# Patient Record
Sex: Female | Born: 1935
Health system: Southern US, Community
[De-identification: ages and names within clinical notes are randomized; demographics above are authoritative.]

## PROBLEM LIST (undated history)

## (undated) DIAGNOSIS — D649 Anemia, unspecified: Secondary | ICD-10-CM

## (undated) DIAGNOSIS — K219 Gastro-esophageal reflux disease without esophagitis: Secondary | ICD-10-CM

## (undated) DIAGNOSIS — E785 Hyperlipidemia, unspecified: Secondary | ICD-10-CM

## (undated) DIAGNOSIS — I10 Essential (primary) hypertension: Secondary | ICD-10-CM

## (undated) HISTORY — DX: Essential (primary) hypertension: I10

## (undated) HISTORY — DX: Gastro-esophageal reflux disease without esophagitis: K21.9

## (undated) HISTORY — DX: Hyperlipidemia, unspecified: E78.5

## (undated) HISTORY — PX: CATARACT EXTRACTION: SUR2

## (undated) HISTORY — DX: Anemia, unspecified: D64.9

---

## 2002-07-02 HISTORY — PX: APPENDECTOMY: SHX54

## 2002-07-02 HISTORY — PX: COLONOSCOPY: SHX174

## 2004-12-26 ENCOUNTER — Ambulatory Visit: Payer: Self-pay | Admitting: Ophthalmology

## 2005-02-23 ENCOUNTER — Ambulatory Visit: Payer: Self-pay | Admitting: Internal Medicine

## 2005-03-01 ENCOUNTER — Ambulatory Visit: Payer: Self-pay | Admitting: Internal Medicine

## 2005-03-16 ENCOUNTER — Ambulatory Visit: Payer: Self-pay | Admitting: Ophthalmology

## 2005-03-20 ENCOUNTER — Ambulatory Visit: Payer: Self-pay | Admitting: Internal Medicine

## 2005-03-20 ENCOUNTER — Other Ambulatory Visit: Payer: Self-pay

## 2005-12-17 ENCOUNTER — Ambulatory Visit: Payer: Self-pay | Admitting: Internal Medicine

## 2006-06-18 ENCOUNTER — Ambulatory Visit: Payer: Self-pay

## 2007-09-10 ENCOUNTER — Ambulatory Visit: Payer: Self-pay | Admitting: Internal Medicine

## 2007-12-17 ENCOUNTER — Ambulatory Visit: Payer: Self-pay | Admitting: Internal Medicine

## 2008-10-21 ENCOUNTER — Ambulatory Visit: Payer: Self-pay | Admitting: Internal Medicine

## 2009-03-04 ENCOUNTER — Ambulatory Visit: Payer: Self-pay | Admitting: Vascular Surgery

## 2009-03-17 ENCOUNTER — Inpatient Hospital Stay: Payer: Self-pay | Admitting: Vascular Surgery

## 2009-04-02 ENCOUNTER — Emergency Department: Payer: Self-pay | Admitting: Emergency Medicine

## 2009-08-29 ENCOUNTER — Inpatient Hospital Stay: Payer: Self-pay | Admitting: Internal Medicine

## 2009-08-30 ENCOUNTER — Ambulatory Visit: Payer: Self-pay | Admitting: Internal Medicine

## 2009-09-12 ENCOUNTER — Ambulatory Visit: Payer: Self-pay | Admitting: Internal Medicine

## 2009-09-30 ENCOUNTER — Ambulatory Visit: Payer: Self-pay | Admitting: Internal Medicine

## 2009-10-27 ENCOUNTER — Ambulatory Visit: Payer: Self-pay | Admitting: Internal Medicine

## 2009-10-30 ENCOUNTER — Ambulatory Visit: Payer: Self-pay | Admitting: Internal Medicine

## 2009-11-01 ENCOUNTER — Ambulatory Visit: Payer: Self-pay | Admitting: Internal Medicine

## 2009-11-30 ENCOUNTER — Ambulatory Visit: Payer: Self-pay | Admitting: Internal Medicine

## 2009-12-30 ENCOUNTER — Ambulatory Visit: Payer: Self-pay | Admitting: Internal Medicine

## 2010-01-12 ENCOUNTER — Ambulatory Visit: Payer: Self-pay | Admitting: Internal Medicine

## 2010-01-30 ENCOUNTER — Ambulatory Visit: Payer: Self-pay | Admitting: Internal Medicine

## 2010-03-21 ENCOUNTER — Ambulatory Visit: Payer: Self-pay | Admitting: Internal Medicine

## 2010-04-30 IMAGING — US US PELVIS LIMITED
1 series · 17 of 22 positions shown · non-contrast
Comparison: none

REASON FOR EXAM: right groin pain, recent vascular procedure
COMMENTS:

[Series 1: us pelvis limited · 17 of 22 slices shown]
[im 1/22]
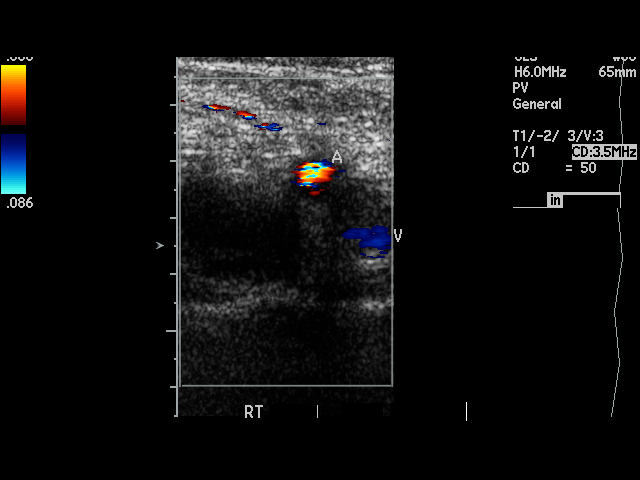
[im 2/22]
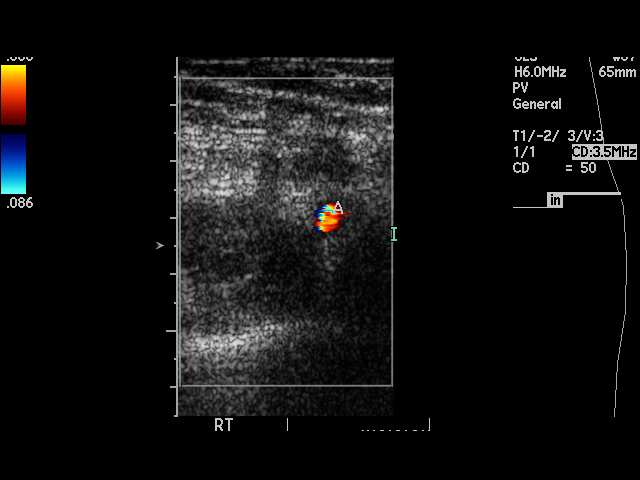
[im 4/22]
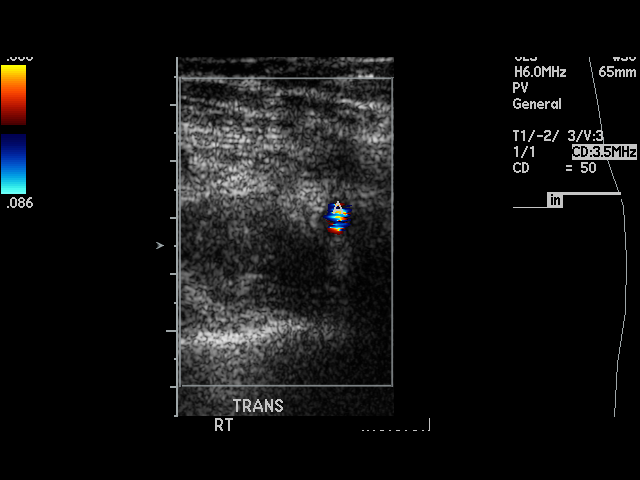
[im 5/22]
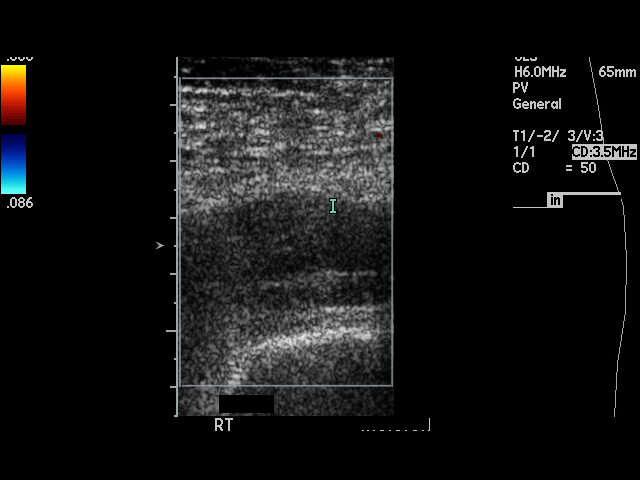
[im 6/22]
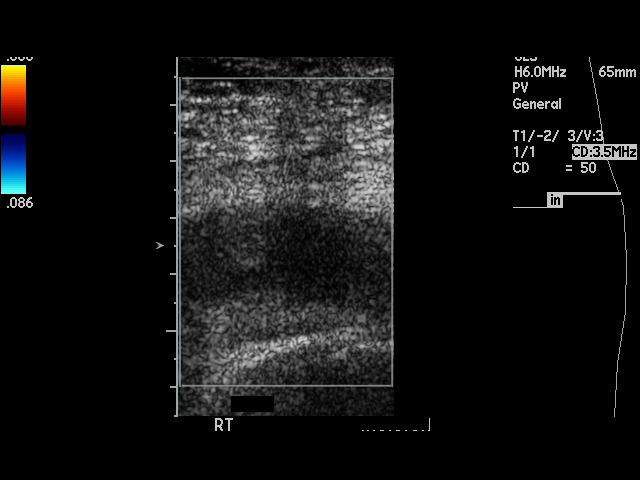
[im 8/22]
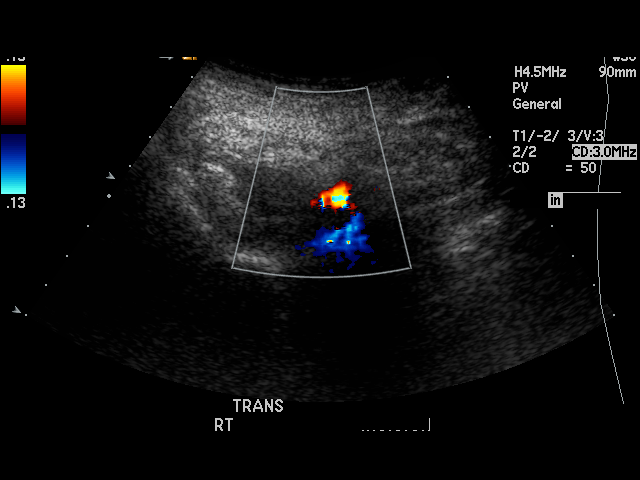
[im 9/22]
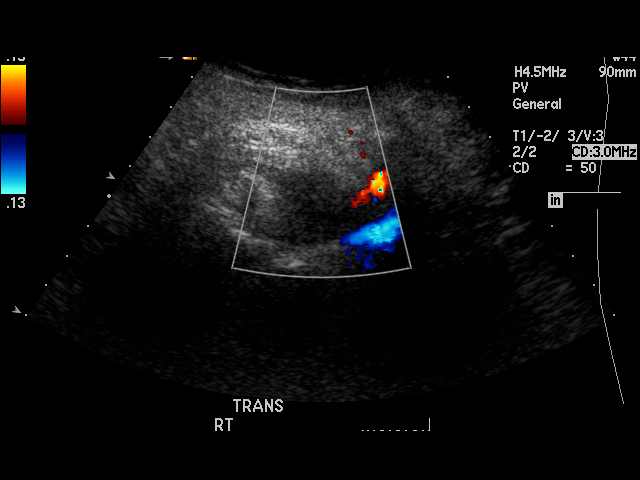
[im 10/22]
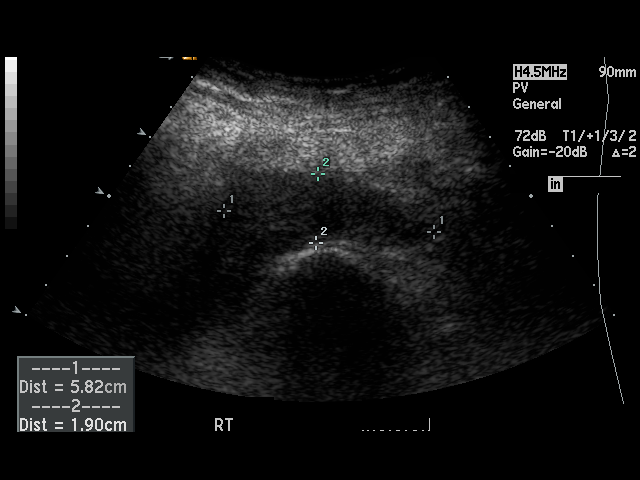
[im 12/22]
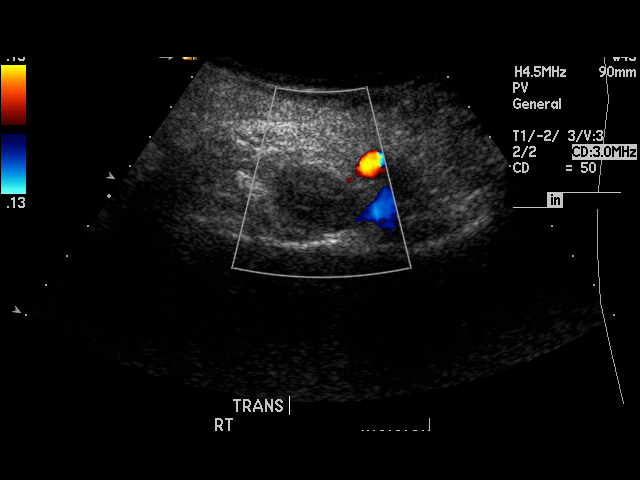
[im 13/22]
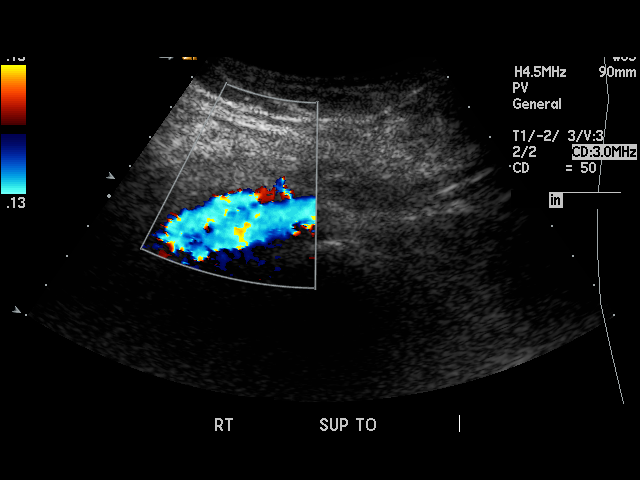
[im 14/22]
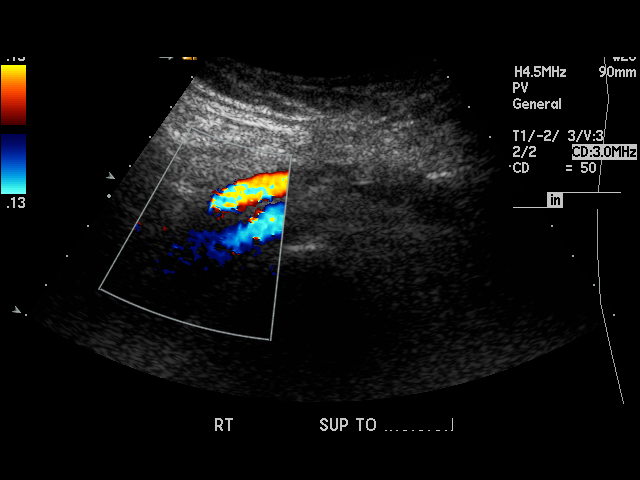
[im 15/22]
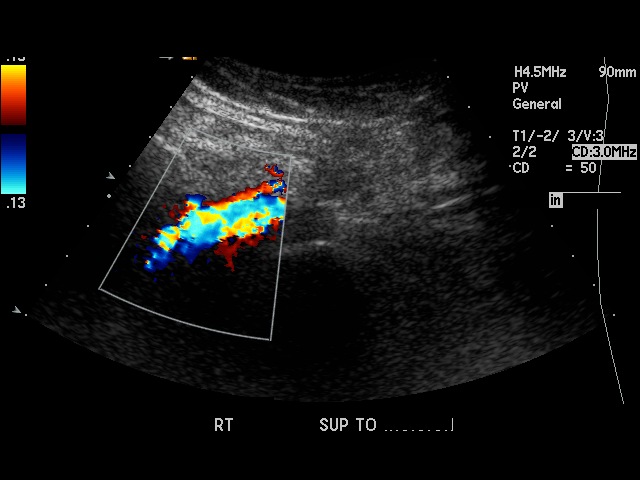
[im 17/22]
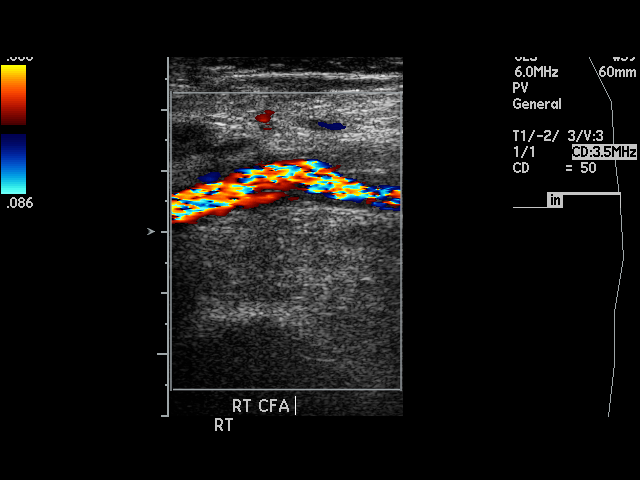
[im 18/22]
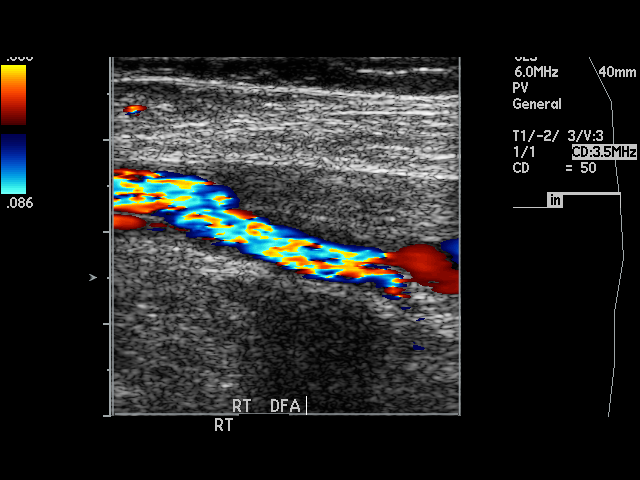
[im 19/22]
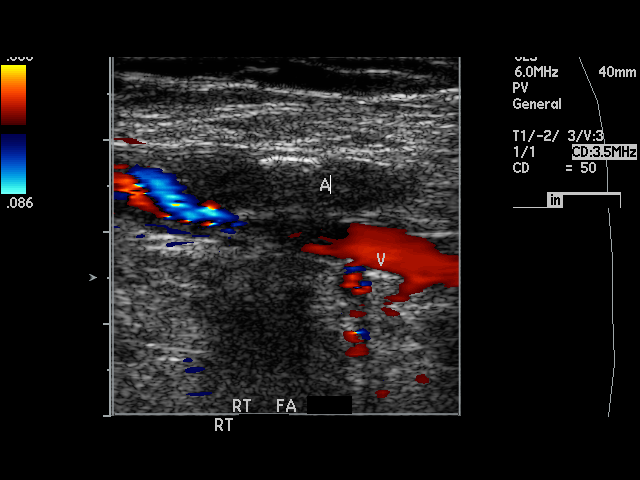
[im 21/22]
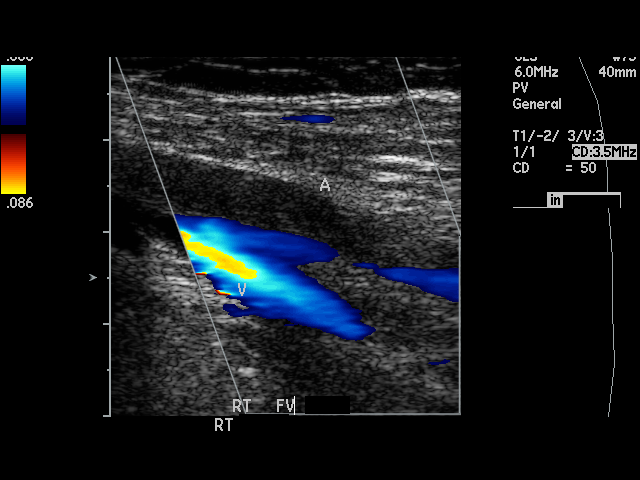
[im 22/22]
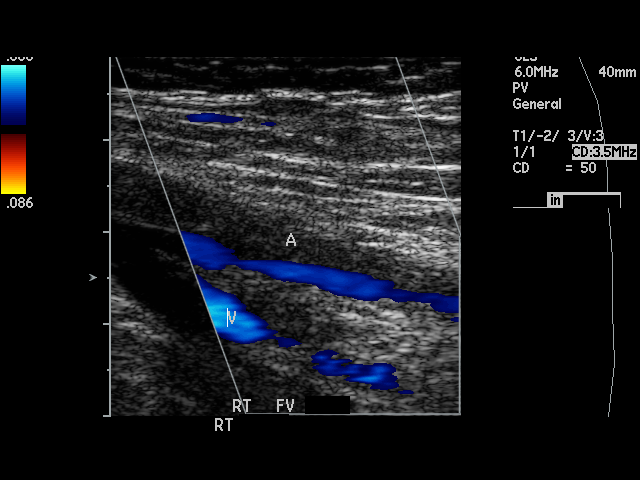

[17 of 22 positions shown; findings below may reference images not displayed]

PROCEDURE:     US  - US PSEUDOANEURYSM GROIN RIGHT  - April 02, 2009 [DATE]

RESULT:     Right groin color flow duplex Doppler reveals roughly 5.8 cm
hypoechoic region. This most likely represents a small hematoma. Clinical
correlation is suggested to exclude infection. There is no evidence of flow
within the density to suggest pseudoaneurysm. Proximal superficial femoral
artery is occluded. Common femoral artery appears patent.
IMPRESSION: Findings consistent with hematoma right groin as described
above.

## 2010-11-08 ENCOUNTER — Emergency Department: Payer: Self-pay | Admitting: *Deleted

## 2011-03-28 ENCOUNTER — Ambulatory Visit: Payer: Self-pay | Admitting: Internal Medicine

## 2012-07-02 HISTORY — PX: BYPASS GRAFT: SHX909

## 2012-11-12 ENCOUNTER — Ambulatory Visit: Payer: Self-pay | Admitting: Internal Medicine

## 2013-01-11 ENCOUNTER — Ambulatory Visit: Payer: Self-pay | Admitting: Family Medicine

## 2013-05-19 DIAGNOSIS — R9439 Abnormal result of other cardiovascular function study: Secondary | ICD-10-CM | POA: Insufficient documentation

## 2013-05-19 DIAGNOSIS — I739 Peripheral vascular disease, unspecified: Secondary | ICD-10-CM | POA: Insufficient documentation

## 2013-05-19 DIAGNOSIS — I10 Essential (primary) hypertension: Secondary | ICD-10-CM | POA: Insufficient documentation

## 2013-05-19 DIAGNOSIS — Z9049 Acquired absence of other specified parts of digestive tract: Secondary | ICD-10-CM | POA: Insufficient documentation

## 2013-05-19 DIAGNOSIS — D649 Anemia, unspecified: Secondary | ICD-10-CM | POA: Insufficient documentation

## 2013-05-19 DIAGNOSIS — Z87891 Personal history of nicotine dependence: Secondary | ICD-10-CM | POA: Insufficient documentation

## 2013-06-03 DIAGNOSIS — Z951 Presence of aortocoronary bypass graft: Secondary | ICD-10-CM | POA: Insufficient documentation

## 2013-06-07 DIAGNOSIS — I9789 Other postprocedural complications and disorders of the circulatory system, not elsewhere classified: Secondary | ICD-10-CM

## 2013-06-07 DIAGNOSIS — I4891 Unspecified atrial fibrillation: Secondary | ICD-10-CM | POA: Insufficient documentation

## 2013-12-02 DIAGNOSIS — R110 Nausea: Secondary | ICD-10-CM | POA: Insufficient documentation

## 2014-01-19 ENCOUNTER — Ambulatory Visit: Payer: Self-pay | Admitting: Internal Medicine

## 2014-01-25 ENCOUNTER — Ambulatory Visit: Payer: Self-pay | Admitting: Gastroenterology

## 2014-08-03 DIAGNOSIS — I10 Essential (primary) hypertension: Secondary | ICD-10-CM | POA: Diagnosis not present

## 2014-08-03 DIAGNOSIS — I739 Peripheral vascular disease, unspecified: Secondary | ICD-10-CM | POA: Diagnosis not present

## 2014-08-03 DIAGNOSIS — D860 Sarcoidosis of lung: Secondary | ICD-10-CM | POA: Diagnosis not present

## 2014-08-03 DIAGNOSIS — E782 Mixed hyperlipidemia: Secondary | ICD-10-CM | POA: Diagnosis not present

## 2014-09-21 DIAGNOSIS — I739 Peripheral vascular disease, unspecified: Secondary | ICD-10-CM | POA: Diagnosis not present

## 2014-09-21 DIAGNOSIS — E782 Mixed hyperlipidemia: Secondary | ICD-10-CM | POA: Diagnosis not present

## 2014-09-21 DIAGNOSIS — I10 Essential (primary) hypertension: Secondary | ICD-10-CM | POA: Diagnosis not present

## 2014-09-21 DIAGNOSIS — R300 Dysuria: Secondary | ICD-10-CM | POA: Diagnosis not present

## 2014-09-21 DIAGNOSIS — Z0001 Encounter for general adult medical examination with abnormal findings: Secondary | ICD-10-CM | POA: Diagnosis not present

## 2014-09-21 DIAGNOSIS — I2584 Coronary atherosclerosis due to calcified coronary lesion: Secondary | ICD-10-CM | POA: Diagnosis not present

## 2014-11-04 DIAGNOSIS — R0602 Shortness of breath: Secondary | ICD-10-CM | POA: Diagnosis not present

## 2014-11-04 DIAGNOSIS — J209 Acute bronchitis, unspecified: Secondary | ICD-10-CM | POA: Diagnosis not present

## 2014-11-18 DIAGNOSIS — I2584 Coronary atherosclerosis due to calcified coronary lesion: Secondary | ICD-10-CM | POA: Diagnosis not present

## 2014-11-18 DIAGNOSIS — R0602 Shortness of breath: Secondary | ICD-10-CM | POA: Diagnosis not present

## 2014-12-23 DIAGNOSIS — Z1231 Encounter for screening mammogram for malignant neoplasm of breast: Secondary | ICD-10-CM | POA: Diagnosis not present

## 2015-03-29 DIAGNOSIS — I10 Essential (primary) hypertension: Secondary | ICD-10-CM | POA: Diagnosis not present

## 2015-03-29 DIAGNOSIS — J209 Acute bronchitis, unspecified: Secondary | ICD-10-CM | POA: Diagnosis not present

## 2015-03-29 DIAGNOSIS — R062 Wheezing: Secondary | ICD-10-CM | POA: Diagnosis not present

## 2015-04-14 DIAGNOSIS — J45991 Cough variant asthma: Secondary | ICD-10-CM | POA: Diagnosis not present

## 2015-04-14 DIAGNOSIS — J209 Acute bronchitis, unspecified: Secondary | ICD-10-CM | POA: Diagnosis not present

## 2015-08-16 DIAGNOSIS — R110 Nausea: Secondary | ICD-10-CM | POA: Diagnosis not present

## 2015-08-16 DIAGNOSIS — I2584 Coronary atherosclerosis due to calcified coronary lesion: Secondary | ICD-10-CM | POA: Diagnosis not present

## 2015-08-16 DIAGNOSIS — I10 Essential (primary) hypertension: Secondary | ICD-10-CM | POA: Diagnosis not present

## 2015-08-16 DIAGNOSIS — J45991 Cough variant asthma: Secondary | ICD-10-CM | POA: Diagnosis not present

## 2015-08-16 DIAGNOSIS — I739 Peripheral vascular disease, unspecified: Secondary | ICD-10-CM | POA: Diagnosis not present

## 2015-11-07 DIAGNOSIS — R062 Wheezing: Secondary | ICD-10-CM | POA: Diagnosis not present

## 2015-11-07 DIAGNOSIS — R05 Cough: Secondary | ICD-10-CM | POA: Diagnosis not present

## 2015-11-07 DIAGNOSIS — J069 Acute upper respiratory infection, unspecified: Secondary | ICD-10-CM | POA: Diagnosis not present

## 2016-03-13 DIAGNOSIS — I129 Hypertensive chronic kidney disease with stage 1 through stage 4 chronic kidney disease, or unspecified chronic kidney disease: Secondary | ICD-10-CM | POA: Diagnosis not present

## 2016-03-13 DIAGNOSIS — I739 Peripheral vascular disease, unspecified: Secondary | ICD-10-CM | POA: Diagnosis not present

## 2016-03-13 DIAGNOSIS — J45991 Cough variant asthma: Secondary | ICD-10-CM | POA: Diagnosis not present

## 2016-03-13 DIAGNOSIS — D500 Iron deficiency anemia secondary to blood loss (chronic): Secondary | ICD-10-CM | POA: Diagnosis not present

## 2016-03-13 DIAGNOSIS — I10 Essential (primary) hypertension: Secondary | ICD-10-CM | POA: Diagnosis not present

## 2016-03-20 DIAGNOSIS — Z0001 Encounter for general adult medical examination with abnormal findings: Secondary | ICD-10-CM | POA: Diagnosis not present

## 2016-03-20 DIAGNOSIS — I739 Peripheral vascular disease, unspecified: Secondary | ICD-10-CM | POA: Diagnosis not present

## 2016-03-20 DIAGNOSIS — R0602 Shortness of breath: Secondary | ICD-10-CM | POA: Diagnosis not present

## 2016-03-20 DIAGNOSIS — I10 Essential (primary) hypertension: Secondary | ICD-10-CM | POA: Diagnosis not present

## 2016-03-20 DIAGNOSIS — R05 Cough: Secondary | ICD-10-CM | POA: Diagnosis not present

## 2016-03-20 DIAGNOSIS — E782 Mixed hyperlipidemia: Secondary | ICD-10-CM | POA: Diagnosis not present

## 2016-03-29 DIAGNOSIS — Z1231 Encounter for screening mammogram for malignant neoplasm of breast: Secondary | ICD-10-CM | POA: Diagnosis not present

## 2016-04-18 DIAGNOSIS — R0602 Shortness of breath: Secondary | ICD-10-CM | POA: Diagnosis not present

## 2016-06-19 DIAGNOSIS — I2584 Coronary atherosclerosis due to calcified coronary lesion: Secondary | ICD-10-CM | POA: Diagnosis not present

## 2016-06-19 DIAGNOSIS — I129 Hypertensive chronic kidney disease with stage 1 through stage 4 chronic kidney disease, or unspecified chronic kidney disease: Secondary | ICD-10-CM | POA: Diagnosis not present

## 2016-06-19 DIAGNOSIS — K210 Gastro-esophageal reflux disease with esophagitis: Secondary | ICD-10-CM | POA: Diagnosis not present

## 2016-06-19 DIAGNOSIS — K222 Esophageal obstruction: Secondary | ICD-10-CM | POA: Diagnosis not present

## 2016-06-19 DIAGNOSIS — E782 Mixed hyperlipidemia: Secondary | ICD-10-CM | POA: Diagnosis not present

## 2016-06-20 ENCOUNTER — Other Ambulatory Visit: Payer: Self-pay | Admitting: Internal Medicine

## 2016-06-20 DIAGNOSIS — K2100 Gastro-esophageal reflux disease with esophagitis, without bleeding: Secondary | ICD-10-CM

## 2016-07-16 ENCOUNTER — Encounter: Payer: Self-pay | Admitting: Radiology

## 2016-07-16 ENCOUNTER — Ambulatory Visit
Admission: RE | Admit: 2016-07-16 | Discharge: 2016-07-16 | Disposition: A | Discharge status: Home / Self Care | Payer: Medicare Other | Source: Ambulatory Visit | Attending: Internal Medicine | Admitting: Internal Medicine

## 2016-07-16 DIAGNOSIS — K449 Diaphragmatic hernia without obstruction or gangrene: Secondary | ICD-10-CM | POA: Insufficient documentation

## 2016-07-16 DIAGNOSIS — K2100 Gastro-esophageal reflux disease with esophagitis, without bleeding: Secondary | ICD-10-CM

## 2016-07-16 DIAGNOSIS — K219 Gastro-esophageal reflux disease without esophagitis: Secondary | ICD-10-CM | POA: Diagnosis not present

## 2016-08-27 ENCOUNTER — Other Ambulatory Visit: Payer: Self-pay

## 2016-08-27 ENCOUNTER — Encounter: Payer: Self-pay | Admitting: Gastroenterology

## 2016-08-27 ENCOUNTER — Ambulatory Visit (INDEPENDENT_AMBULATORY_CARE_PROVIDER_SITE_OTHER): Payer: No Typology Code available for payment source | Admitting: Gastroenterology

## 2016-08-27 VITALS — BP 156/81 | HR 67 | Temp 97.9°F | Ht 62.5 in | Wt 120.0 lb

## 2016-08-27 DIAGNOSIS — R110 Nausea: Secondary | ICD-10-CM | POA: Diagnosis not present

## 2016-08-27 DIAGNOSIS — I2510 Atherosclerotic heart disease of native coronary artery without angina pectoris: Secondary | ICD-10-CM | POA: Insufficient documentation

## 2016-08-27 MED ORDER — ONDANSETRON 4 MG PO TBDP: 4.0000 mg | ORAL_TABLET | Freq: Three times a day (TID) | ORAL | 1 refills | Status: DC | PRN

## 2016-08-27 NOTE — Patient Instructions (Addendum)
You have been given a prescription for Zofran 4mg . This has been sent to [facility]. If you have any questions, please contact our office.

## 2016-08-27 NOTE — Progress Notes (Signed)
Primary Care Physician: Lavera Guise, MD  Primary Gastroenterologist:  Dr. Lucilla Lame  Chief Complaint  Patient presents with  . Esophageal stricture    HPI: Olivia Lane is a 81 y.o. female here for nausea. The patient had an upper endoscopy back in July 2015 by me with a finding of hiatal hernia. The patient had a upper GI series that showed a narrowing at the distal esophagus but the patient denies any dysphagia at this time. The patient reports that her only complaint is that she is nauseous. She states that she has these attacks of nausea and a few times a week. She states that it is usually when she gets up and walks around. There is no relationship to eating or drinking. She also denies that moving her bowels or being constipated causes her any increase or decrease in her nausea. This seems to be very little GI activities that she reports to set the symptoms off.  Current Outpatient Prescriptions  Medication Sig Dispense Refill  . amLODipine (NORVASC) 10 MG tablet     . atorvastatin (LIPITOR) 40 MG tablet     . clopidogrel (PLAVIX) 75 MG tablet     . metoprolol (LOPRESSOR) 50 MG tablet     . nitroGLYCERIN (NITROSTAT) 0.4 MG SL tablet Place under the tongue.    . pantoprazole (PROTONIX) 40 MG tablet     . PROAIR HFA 108 (90 Base) MCG/ACT inhaler     . aspirin EC 81 MG tablet Take by mouth.    . ondansetron (ZOFRAN ODT) 4 MG disintegrating tablet Take 1 tablet (4 mg total) by mouth every 8 (eight) hours as needed for nausea or vomiting. 20 tablet 1   No current facility-administered medications for this visit.     Allergies as of 08/27/2016  . (No Known Allergies)    ROS:  General: Negative for anorexia, weight loss, fever, chills, fatigue, weakness. ENT: Negative for hoarseness, difficulty swallowing , nasal congestion. CV: Negative for chest pain, angina, palpitations, dyspnea on exertion, peripheral edema.  Respiratory: Negative for dyspnea at rest, dyspnea  on exertion, cough, sputum, wheezing.  GI: See history of present illness. GU:  Negative for dysuria, hematuria, urinary incontinence, urinary frequency, nocturnal urination.  Endo: Negative for unusual weight change.    Physical Examination:   BP (!) 156/81   Pulse 67   Temp 97.9 F (36.6 C) (Oral)   Ht 5' 2.5" (1.588 m)   Wt 120 lb (54.4 kg)   BMI 21.60 kg/m   General: Well-nourished, well-developed in no acute distress.  Eyes: No icterus. Conjunctivae pink. Mouth: Oropharyngeal mucosa moist and pink , no lesions erythema or exudate. Lungs: Clear to auscultation bilaterally. Non-labored. Heart: Regular rate and rhythm, no murmurs rubs or gallops.  Abdomen: Bowel sounds are normal, nontender, nondistended, no hepatosplenomegaly or masses, no abdominal bruits or hernia , no rebound or guarding.   Extremities: No lower extremity edema. No clubbing or deformities. Neuro: Alert and oriented x 3.  Grossly intact. Skin: Warm and dry, no jaundice.   Psych: Alert and cooperative, normal mood and affect.  Labs:    Imaging Studies: No results found.  Assessment and Plan:   Olivia Lane is a 81 y.o. y/o female who has nausea when she walks around but denies any nausea associated with any GI function such as moving her bowels or eating. The patient states it can happen in the morning typically when she walks around. Aphasia despite having a stricture  seen on the upper GI series. The patient has a history of this and has been told to follow-up with me for dilation if she starts to develop dysphagia. The patient will also be started on Zofran for her nausea. The nausea does not seem to be GI-related since she is not having heartburn or any symptoms associated with eating or drinking but usually has the nausea when she walks around. The patient has been explained the plan and agrees with it.    Lucilla Lame, MD. Marval Regal   Note: This dictation was prepared with Dragon dictation along  with smaller phrase technology. Any transcriptional errors that result from this process are unintentional.

## 2016-09-25 DIAGNOSIS — R1013 Epigastric pain: Secondary | ICD-10-CM | POA: Diagnosis not present

## 2016-09-25 DIAGNOSIS — I2584 Coronary atherosclerosis due to calcified coronary lesion: Secondary | ICD-10-CM | POA: Diagnosis not present

## 2016-09-25 DIAGNOSIS — J449 Chronic obstructive pulmonary disease, unspecified: Secondary | ICD-10-CM | POA: Diagnosis not present

## 2016-09-25 DIAGNOSIS — K210 Gastro-esophageal reflux disease with esophagitis: Secondary | ICD-10-CM | POA: Diagnosis not present

## 2016-09-25 DIAGNOSIS — I10 Essential (primary) hypertension: Secondary | ICD-10-CM | POA: Diagnosis not present

## 2017-03-14 DIAGNOSIS — L578 Other skin changes due to chronic exposure to nonionizing radiation: Secondary | ICD-10-CM | POA: Diagnosis not present

## 2017-03-14 DIAGNOSIS — L821 Other seborrheic keratosis: Secondary | ICD-10-CM | POA: Diagnosis not present

## 2017-03-14 DIAGNOSIS — L820 Inflamed seborrheic keratosis: Secondary | ICD-10-CM | POA: Diagnosis not present

## 2017-03-26 DIAGNOSIS — I10 Essential (primary) hypertension: Secondary | ICD-10-CM | POA: Diagnosis not present

## 2017-03-26 DIAGNOSIS — J449 Chronic obstructive pulmonary disease, unspecified: Secondary | ICD-10-CM | POA: Diagnosis not present

## 2017-03-26 DIAGNOSIS — Z0001 Encounter for general adult medical examination with abnormal findings: Secondary | ICD-10-CM | POA: Diagnosis not present

## 2017-03-26 DIAGNOSIS — I2584 Coronary atherosclerosis due to calcified coronary lesion: Secondary | ICD-10-CM | POA: Diagnosis not present

## 2017-03-26 DIAGNOSIS — K210 Gastro-esophageal reflux disease with esophagitis: Secondary | ICD-10-CM | POA: Diagnosis not present

## 2017-05-16 DIAGNOSIS — L821 Other seborrheic keratosis: Secondary | ICD-10-CM | POA: Diagnosis not present

## 2017-05-16 DIAGNOSIS — L820 Inflamed seborrheic keratosis: Secondary | ICD-10-CM | POA: Diagnosis not present

## 2017-05-16 DIAGNOSIS — L578 Other skin changes due to chronic exposure to nonionizing radiation: Secondary | ICD-10-CM | POA: Diagnosis not present

## 2017-07-08 ENCOUNTER — Other Ambulatory Visit: Payer: Self-pay | Admitting: Nurse Practitioner

## 2017-07-08 DIAGNOSIS — I10 Essential (primary) hypertension: Secondary | ICD-10-CM | POA: Diagnosis not present

## 2017-07-08 DIAGNOSIS — E559 Vitamin D deficiency, unspecified: Secondary | ICD-10-CM | POA: Diagnosis not present

## 2017-07-08 DIAGNOSIS — Z0001 Encounter for general adult medical examination with abnormal findings: Secondary | ICD-10-CM | POA: Diagnosis not present

## 2017-07-11 ENCOUNTER — Encounter: Payer: Self-pay | Admitting: Nurse Practitioner

## 2017-07-11 ENCOUNTER — Ambulatory Visit (INDEPENDENT_AMBULATORY_CARE_PROVIDER_SITE_OTHER): Payer: Medicare Other | Admitting: Nurse Practitioner

## 2017-07-11 ENCOUNTER — Other Ambulatory Visit: Payer: Self-pay | Admitting: Internal Medicine

## 2017-07-11 VITALS — BP 130/80 | HR 57 | Temp 97.5°F | Resp 16 | Ht 62.0 in | Wt 115.0 lb

## 2017-07-11 DIAGNOSIS — R05 Cough: Secondary | ICD-10-CM | POA: Diagnosis not present

## 2017-07-11 DIAGNOSIS — E782 Mixed hyperlipidemia: Secondary | ICD-10-CM | POA: Diagnosis not present

## 2017-07-11 DIAGNOSIS — K2100 Gastro-esophageal reflux disease with esophagitis, without bleeding: Secondary | ICD-10-CM | POA: Insufficient documentation

## 2017-07-11 DIAGNOSIS — J440 Chronic obstructive pulmonary disease with acute lower respiratory infection: Secondary | ICD-10-CM

## 2017-07-11 DIAGNOSIS — I10 Essential (primary) hypertension: Secondary | ICD-10-CM | POA: Diagnosis not present

## 2017-07-11 DIAGNOSIS — R059 Cough, unspecified: Secondary | ICD-10-CM

## 2017-07-11 DIAGNOSIS — J449 Chronic obstructive pulmonary disease, unspecified: Secondary | ICD-10-CM | POA: Insufficient documentation

## 2017-07-11 LAB — CBC
HEMOGLOBIN: 12.3 g/dL (ref 11.1–15.9)
Hematocrit: 38 % (ref 34.0–46.6)
MCH: 26.7 pg (ref 26.6–33.0)
MCHC: 32.4 g/dL (ref 31.5–35.7)
MCV: 82 fL (ref 79–97)
Platelets: 509 10*3/uL — ABNORMAL HIGH (ref 150–379)
RBC: 4.61 x10E6/uL (ref 3.77–5.28)
RDW: 15.9 % — ABNORMAL HIGH (ref 12.3–15.4)
WBC: 8.8 10*3/uL (ref 3.4–10.8)

## 2017-07-11 LAB — COMPREHENSIVE METABOLIC PANEL
A/G RATIO: 1.3 (ref 1.2–2.2)
ALBUMIN: 3.8 g/dL (ref 3.5–4.7)
ALT: 9 IU/L (ref 0–32)
AST: 16 IU/L (ref 0–40)
Alkaline Phosphatase: 145 IU/L — ABNORMAL HIGH (ref 39–117)
BILIRUBIN TOTAL: 0.3 mg/dL (ref 0.0–1.2)
BUN/Creatinine Ratio: 7 — ABNORMAL LOW (ref 12–28)
BUN: 10 mg/dL (ref 8–27)
CHLORIDE: 109 mmol/L — AB (ref 96–106)
CO2: 20 mmol/L (ref 20–29)
Calcium: 9.1 mg/dL (ref 8.7–10.3)
Creatinine, Ser: 1.34 mg/dL — ABNORMAL HIGH (ref 0.57–1.00)
GFR, EST AFRICAN AMERICAN: 43 mL/min/{1.73_m2} — AB (ref >59)
GFR, EST NON AFRICAN AMERICAN: 37 mL/min/{1.73_m2} — AB (ref >59)
Globulin, Total: 2.9 g/dL (ref 1.5–4.5)
Glucose: 114 mg/dL — ABNORMAL HIGH (ref 65–99)
POTASSIUM: 4.7 mmol/L (ref 3.5–5.2)
Sodium: 144 mmol/L (ref 134–144)
TOTAL PROTEIN: 6.7 g/dL (ref 6.0–8.5)

## 2017-07-11 LAB — LIPID PANEL W/O CHOL/HDL RATIO
CHOLESTEROL TOTAL: 145 mg/dL (ref 100–199)
HDL: 42 mg/dL (ref >39)
LDL CALC: 82 mg/dL (ref 0–99)
Triglycerides: 105 mg/dL (ref 0–149)
VLDL CHOLESTEROL CAL: 21 mg/dL (ref 5–40)

## 2017-07-11 LAB — TSH: TSH: 3.58 u[IU]/mL (ref 0.450–4.500)

## 2017-07-11 LAB — T4, FREE: Free T4: 1.54 ng/dL (ref 0.82–1.77)

## 2017-07-11 LAB — VITAMIN D 25 HYDROXY (VIT D DEFICIENCY, FRACTURES): Vit D, 25-Hydroxy: 10 ng/mL — ABNORMAL LOW (ref 30.0–100.0)

## 2017-07-11 MED ORDER — LEVOFLOXACIN 500 MG PO TABS: 500.0000 mg | ORAL_TABLET | Freq: Every day | ORAL | 0 refills | Status: DC

## 2017-07-11 NOTE — Progress Notes (Signed)
[facility] [place], [place] [address]  Internal MEDICINE  Office Visit Note  Patient Name: Olivia Lane  [id number]  [id number]  Date of Service: 07/11/2017     Complaints/HPI Pt is here for routine follow up.  Cough  This is a new problem. The current episode started 1 to 4 weeks ago. The problem has been unchanged. The problem occurs every few minutes. The cough is productive of sputum. Associated symptoms include headaches, nasal congestion, postnasal drip, rhinorrhea, shortness of breath and wheezing. Pertinent negatives include no chills, fever or sore throat. Nothing aggravates the symptoms. Risk factors for lung disease include animal exposure. She has tried nothing for the symptoms. The treatment provided no relief. Her past medical history is significant for asthma and environmental allergies.  Sinusitis  Associated symptoms include congestion, coughing, headaches, shortness of breath and sneezing. Pertinent negatives include no chills or sore throat.   Chief Complaint  Patient presents with  . Cough  . Sinusitis   Current Medication: Outpatient Encounter Medications as of 07/11/2017  Medication Sig  . amLODipine (NORVASC) 10 MG tablet Take 10 mg by mouth at bedtime.   Marland Kitchen aspirin EC 81 MG tablet Take by mouth.  Marland Kitchen atorvastatin (LIPITOR) 40 MG tablet Take 40 mg by mouth at bedtime.   . budesonide-formoterol (SYMBICORT) 80-4.5 MCG/ACT inhaler Inhale 1 puff into the lungs 2 (two) times daily.  . clopidogrel (PLAVIX) 75 MG tablet   . metoprolol tartrate (LOPRESSOR) 50 MG tablet TAKE 1 TABLET BY MOUTH EVERY 12 HOURS  . pantoprazole (PROTONIX) 40 MG tablet TAKE 1 TABLET BY MOUTH 2 TIMES DAILY FORGASTRITIS AND ESOPHA STICTURE  . PROAIR HFA 108 (90 Base) MCG/ACT inhaler Inhale 1-2 puffs into the lungs every 6 (six) hours as needed for wheezing.   . nitroGLYCERIN (NITROSTAT) 0.4 MG SL tablet Place under the tongue.  . ondansetron (ZOFRAN ODT) 4  MG disintegrating tablet Take 1 tablet (4 mg total) by mouth every 8 (eight) hours as needed for nausea or vomiting. (Patient not taking: Reported on 07/11/2017)  . [DISCONTINUED] metoprolol (LOPRESSOR) 50 MG tablet Take 50 mg by mouth every 12 (twelve) hours.   . [DISCONTINUED] pantoprazole (PROTONIX) 40 MG tablet Take 40 mg by mouth 2 (two) times daily.    No facility-administered encounter medications on file as of 07/11/2017.     Surgical History: Past Surgical History:  Procedure Laterality Date  . APPENDECTOMY  2004  . BYPASS GRAFT  2014  . CATARACT EXTRACTION  [id number]  . COLONOSCOPY  2004    Medical History: Past Medical History:  Diagnosis Date  . Anemia   . Gastro-esophageal reflux   . Hyperlipidemia   . Hypertension     Family History: No family history on file.  Social History   Socioeconomic History  . Marital status: Single    Spouse name: Not on file  . Number of children: Not on file  . Years of education: Not on file  . Highest education level: Not on file  Social Needs  . Financial resource strain: Not on file  . Food insecurity - worry: Not on file  . Food insecurity - inability: Not on file  . Transportation needs - medical: Not on file  . Transportation needs - non-medical: Not on file  Occupational History  . Not on file  Tobacco Use  . Smoking status: Former Research scientist (life sciences)  . Smokeless tobacco: Never Used  Substance and Sexual Activity  . Alcohol use: No  .  Drug use: No  . Sexual activity: Not on file  Other Topics Concern  . Not on file  Social History Narrative  . Not on file      Review of Systems  Constitutional: Positive for fatigue. Negative for activity change, chills and fever.  HENT: Positive for congestion, postnasal drip, rhinorrhea and sneezing. Negative for sore throat.   Respiratory: Positive for cough, shortness of breath and wheezing.   Gastrointestinal: Negative for constipation, diarrhea, nausea and vomiting.  Endocrine:  Negative.   Allergic/Immunologic: Positive for environmental allergies.  Neurological: Positive for headaches.  Hematological: Negative.   Psychiatric/Behavioral: Negative.     Today's Vitals   07/11/17 1041  BP: 130/80  Pulse: (!) 57  Resp: 16  Temp: (!) 97.5 F (36.4 C)  SpO2: 97%  Weight: 115 lb (52.2 kg)  Height: 5\' 2"  (1.575 m)    Physical Exam  Constitutional: She is oriented to person, place, and time. She appears well-developed and well-nourished.  HENT:  Head: Normocephalic.  Eyes: Pupils are equal, round, and reactive to light.  Neck: Normal range of motion. Neck supple.  Cardiovascular: Normal rate and regular rhythm.  Pulmonary/Chest: Effort normal and breath sounds normal.  Abdominal: Soft. There is no tenderness.  Musculoskeletal: Normal range of motion.  Neurological: She is alert and oriented to person, place, and time.  Skin: Skin is warm and dry.  Psychiatric: She has a normal mood and affect.  Nursing note and vitals reviewed.   Assessment/Plan:    ICD-10-CM   1. COPD with lower respiratory infection ([place]) J44.0 levofloxacin (LEVAQUIN) 500 MG tablet  2. Cough in adult R05   3. Essential hypertension I10   4. Mixed hyperlipidemia E78.2     1. Add levofloxacin 500mg  daily for 10 days. Use rescue inhaler as needed for cough/SOB 2. mucinex BID as needed for cough/congestion.  3. BP stable. contineu bp medication as prescribed  4. Waiting results of fasting lipid panel. Will adjust atorvastatin as indicated.  Follow up 4 months and sooner if needed General Counseling: I have discussed the findings of the evaluation and examination with Charlett Nose.  I have also discussed any further diagnostic evaluation that may be needed or ordered today. Darra verbalizes understanding of the findings of todays visit. We also reviewed her medications today. she has been encouraged to call the office with any questions or concerns that should arise related to todays  visit.  This patient was seen by Leretha Pol, FNP- C in Collaboration with Dr Lavera Guise as a part of collaborative care agreement   Time spent 15 minutes    Dr Lavera Guise Internal medicine

## 2017-07-18 DIAGNOSIS — D485 Neoplasm of uncertain behavior of skin: Secondary | ICD-10-CM | POA: Diagnosis not present

## 2017-07-18 DIAGNOSIS — R234 Changes in skin texture: Secondary | ICD-10-CM | POA: Diagnosis not present

## 2017-07-18 DIAGNOSIS — L820 Inflamed seborrheic keratosis: Secondary | ICD-10-CM | POA: Diagnosis not present

## 2017-07-24 ENCOUNTER — Other Ambulatory Visit: Payer: Self-pay | Admitting: Nurse Practitioner

## 2017-07-24 DIAGNOSIS — E559 Vitamin D deficiency, unspecified: Secondary | ICD-10-CM

## 2017-07-24 MED ORDER — ERGOCALCIFEROL 1.25 MG (50000 UT) PO CAPS: 50000.0000 [IU] | ORAL_CAPSULE | ORAL | 5 refills | Status: DC

## 2017-07-24 NOTE — Progress Notes (Signed)
Drisdol 50000iu weekly for 6 mnths due to vitamin d deficiency. Will recheck cbc due to elevated platelets and bmp due to elevated renal functions.

## 2017-08-05 ENCOUNTER — Other Ambulatory Visit: Payer: Self-pay | Admitting: Nurse Practitioner

## 2017-08-05 DIAGNOSIS — R944 Abnormal results of kidney function studies: Secondary | ICD-10-CM | POA: Diagnosis not present

## 2017-08-05 DIAGNOSIS — D691 Qualitative platelet defects: Secondary | ICD-10-CM | POA: Diagnosis not present

## 2017-08-06 LAB — BASIC METABOLIC PANEL
BUN / CREAT RATIO: 9 — AB (ref 12–28)
BUN: 13 mg/dL (ref 8–27)
CO2: 20 mmol/L (ref 20–29)
CREATININE: 1.45 mg/dL — AB (ref 0.57–1.00)
Calcium: 9.1 mg/dL (ref 8.7–10.3)
Chloride: 107 mmol/L — ABNORMAL HIGH (ref 96–106)
GFR, EST AFRICAN AMERICAN: 39 mL/min/{1.73_m2} — AB (ref >59)
GFR, EST NON AFRICAN AMERICAN: 34 mL/min/{1.73_m2} — AB (ref >59)
Glucose: 92 mg/dL (ref 65–99)
Potassium: 4.7 mmol/L (ref 3.5–5.2)
SODIUM: 142 mmol/L (ref 134–144)

## 2017-08-06 LAB — CBC
HEMATOCRIT: 38.2 % (ref 34.0–46.6)
HEMOGLOBIN: 12.6 g/dL (ref 11.1–15.9)
MCH: 26 pg — ABNORMAL LOW (ref 26.6–33.0)
MCHC: 33 g/dL (ref 31.5–35.7)
MCV: 79 fL (ref 79–97)
Platelets: 552 10*3/uL — ABNORMAL HIGH (ref 150–379)
RBC: 4.84 x10E6/uL (ref 3.77–5.28)
RDW: 16.2 % — ABNORMAL HIGH (ref 12.3–15.4)
WBC: 7.7 10*3/uL (ref 3.4–10.8)

## 2017-10-26 ENCOUNTER — Other Ambulatory Visit: Payer: Self-pay | Admitting: Internal Medicine

## 2017-11-11 ENCOUNTER — Ambulatory Visit: Payer: Self-pay | Admitting: Nurse Practitioner

## 2017-11-14 DIAGNOSIS — L820 Inflamed seborrheic keratosis: Secondary | ICD-10-CM | POA: Diagnosis not present

## 2017-11-14 DIAGNOSIS — H61001 Unspecified perichondritis of right external ear: Secondary | ICD-10-CM | POA: Diagnosis not present

## 2017-11-15 ENCOUNTER — Ambulatory Visit (INDEPENDENT_AMBULATORY_CARE_PROVIDER_SITE_OTHER): Payer: Medicare Other | Admitting: Nurse Practitioner

## 2017-11-15 ENCOUNTER — Encounter: Payer: Self-pay | Admitting: Nurse Practitioner

## 2017-11-15 VITALS — BP 145/56 | HR 65 | Resp 16 | Ht 62.5 in | Wt 115.4 lb

## 2017-11-15 DIAGNOSIS — D473 Essential (hemorrhagic) thrombocythemia: Secondary | ICD-10-CM | POA: Diagnosis not present

## 2017-11-15 DIAGNOSIS — J449 Chronic obstructive pulmonary disease, unspecified: Secondary | ICD-10-CM | POA: Diagnosis not present

## 2017-11-15 DIAGNOSIS — I10 Essential (primary) hypertension: Secondary | ICD-10-CM | POA: Diagnosis not present

## 2017-11-15 DIAGNOSIS — E782 Mixed hyperlipidemia: Secondary | ICD-10-CM

## 2017-11-15 DIAGNOSIS — D75839 Thrombocytosis, unspecified: Secondary | ICD-10-CM

## 2017-11-15 NOTE — Progress Notes (Signed)
[facility] [place], [place] [address]  Internal MEDICINE  Office Visit Note  Patient Name: Olivia Lane  [id number]  [id number]  Date of Service: 12/08/2017   Pt is here for routine follow up.   Chief Complaint  Patient presents with  . COPD    4 month follow up    COPD  She complains of cough, shortness of breath and wheezing. This is a chronic problem. The current episode started more than 1 year ago. The problem occurs intermittently. The problem has been waxing and waning. The cough is productive of sputum. Associated symptoms include dyspnea on exertion, headaches, malaise/fatigue and nasal congestion. Pertinent negatives include no chest pain, fever, myalgias, postnasal drip, rhinorrhea, sneezing or sore throat. Her symptoms are aggravated by pollen and URI. Her symptoms are alleviated by beta-agonist and steroid inhaler. She reports moderate improvement on treatment. Her past medical history is significant for COPD.       Current Medication: Outpatient Encounter Medications as of 11/15/2017  Medication Sig  . amLODipine (NORVASC) 10 MG tablet Take 10 mg by mouth at bedtime.   Marland Kitchen aspirin EC 81 MG tablet Take by mouth.  Marland Kitchen atorvastatin (LIPITOR) 40 MG tablet TAKE 1 TABLET BY MOUTH AT BEDTIME  . budesonide-formoterol (SYMBICORT) 80-4.5 MCG/ACT inhaler Inhale 1 puff into the lungs 2 (two) times daily.  . clopidogrel (PLAVIX) 75 MG tablet   . ergocalciferol (DRISDOL) 50000 units capsule Take 1 capsule (50,000 Units total) by mouth once a week.  Marland Kitchen levofloxacin (LEVAQUIN) 500 MG tablet Take 1 tablet (500 mg total) by mouth daily.  . metoprolol tartrate (LOPRESSOR) 50 MG tablet TAKE 1 TABLET BY MOUTH EVERY 12 HOURS  . nitroGLYCERIN (NITROSTAT) 0.4 MG SL tablet Place under the tongue.  . ondansetron (ZOFRAN ODT) 4 MG disintegrating tablet Take 1 tablet (4 mg total) by mouth every 8 (eight) hours as needed for nausea or vomiting.  . pantoprazole  (PROTONIX) 40 MG tablet TAKE 1 TABLET BY MOUTH 2 TIMES DAILY FORGASTRITIS AND ESOPHA STICTURE  . [DISCONTINUED] PROAIR HFA 108 (90 Base) MCG/ACT inhaler Inhale 1-2 puffs into the lungs every 6 (six) hours as needed for wheezing.    No facility-administered encounter medications on file as of 11/15/2017.     Surgical History: Past Surgical History:  Procedure Laterality Date  . APPENDECTOMY  2004  . BYPASS GRAFT  2014  . CATARACT EXTRACTION  [id number]  . COLONOSCOPY  2004    Medical History: Past Medical History:  Diagnosis Date  . Anemia   . Gastro-esophageal reflux   . Hyperlipidemia   . Hypertension     Family History: History reviewed. No pertinent family history.  Social History   Socioeconomic History  . Marital status: Single    Spouse name: Not on file  . Number of children: Not on file  . Years of education: Not on file  . Highest education level: Not on file  Occupational History  . Not on file  Social Needs  . Financial resource strain: Not on file  . Food insecurity:    Worry: Not on file    Inability: Not on file  . Transportation needs:    Medical: Not on file    Non-medical: Not on file  Tobacco Use  . Smoking status: Former Research scientist (life sciences)  . Smokeless tobacco: Never Used  Substance and Sexual Activity  . Alcohol use: No  . Drug use: No  . Sexual activity: Not on file  Lifestyle  .  Physical activity:    Days per week: Not on file    Minutes per session: Not on file  . Stress: Not on file  Relationships  . Social connections:    Talks on phone: Not on file    Gets together: Not on file    Attends religious service: Not on file    Active member of club or organization: Not on file    Attends meetings of clubs or organizations: Not on file    Relationship status: Not on file  . Intimate partner violence:    Fear of current or ex partner: Not on file    Emotionally abused: Not on file    Physically abused: Not on file    Forced sexual activity:  Not on file  Other Topics Concern  . Not on file  Social History Narrative  . Not on file      Review of Systems  Constitutional: Positive for malaise/fatigue. Negative for activity change, chills, fatigue and fever.  HENT: Negative for congestion, postnasal drip, rhinorrhea, sneezing, sore throat and voice change.   Eyes: Negative.   Respiratory: Positive for cough, shortness of breath and wheezing.   Cardiovascular: Positive for dyspnea on exertion. Negative for chest pain and palpitations.  Gastrointestinal: Negative for constipation, diarrhea, nausea and vomiting.  Endocrine: Negative for cold intolerance, heat intolerance and polyphagia.  Musculoskeletal: Negative for arthralgias, back pain and myalgias.  Skin: Negative for rash.  Allergic/Immunologic: Positive for environmental allergies.  Neurological: Positive for headaches. Negative for dizziness.  Hematological: Negative for adenopathy.  Psychiatric/Behavioral: Negative for dysphoric mood. The patient is not nervous/anxious.     Today's Vitals   11/15/17 1102  BP: (!) 145/56  Pulse: 65  Resp: 16  SpO2: 93%  Weight: 115 lb 6.4 oz (52.3 kg)  Height: 5' 2.5" (1.588 m)    Physical Exam  Constitutional: She is oriented to person, place, and time. She appears well-developed and well-nourished.  HENT:  Head: Normocephalic.  Nose: Nose normal.  Eyes: Pupils are equal, round, and reactive to light. Conjunctivae are normal.  Neck: Normal range of motion. Neck supple. No JVD present. Carotid bruit is not present. No thyromegaly present.  Cardiovascular: Normal rate, regular rhythm and normal heart sounds.  Pulmonary/Chest: Effort normal and breath sounds normal. No respiratory distress. She has no wheezes.  Abdominal: Soft. Bowel sounds are normal. There is no tenderness.  Musculoskeletal: Normal range of motion.  Neurological: She is alert and oriented to person, place, and time. She displays normal reflexes. No cranial  nerve deficit.  Skin: Skin is warm and dry. Capillary refill takes 2 to 3 seconds.  Psychiatric: She has a normal mood and affect. Her behavior is normal. Judgment and thought content normal.  Nursing note and vitals reviewed.  Assessment/Plan: 1. Essential hypertension Stable. Continue bp medication as prescribed.   2. Chronic obstructive pulmonary disease, unspecified COPD type ([place]) Stable. Continue inhalers and respiratory medications as prescribed.   3. Mixed hyperlipidemia Continue atorvastatin as prescribed  4. Thrombocytosis ([place]) Check labs.   General Counseling: perrie ragin understanding of the findings of todays visit and agrees with plan of treatment. I have discussed any further diagnostic evaluation that may be needed or ordered today. We also reviewed her medications today. she has been encouraged to call the office with any questions or concerns that should arise related to todays visit.    Counseling:  This patient was seen by Leretha Pol, FNP- C in Collaboration with Dr Latricia Heft  Berna Spare as a part of collaborative care agreement      Time spent: 1 Minutes          Dr Lavera Guise Internal medicine

## 2017-12-02 ENCOUNTER — Other Ambulatory Visit: Payer: Self-pay

## 2017-12-02 MED ORDER — PROAIR HFA 108 (90 BASE) MCG/ACT IN AERS: 1.0000 | INHALATION_SPRAY | Freq: Four times a day (QID) | RESPIRATORY_TRACT | 5 refills | Status: DC | PRN

## 2017-12-08 DIAGNOSIS — D473 Essential (hemorrhagic) thrombocythemia: Secondary | ICD-10-CM | POA: Insufficient documentation

## 2017-12-08 DIAGNOSIS — D75839 Thrombocytosis, unspecified: Secondary | ICD-10-CM | POA: Insufficient documentation

## 2017-12-14 ENCOUNTER — Other Ambulatory Visit: Payer: Self-pay | Admitting: Internal Medicine

## 2018-01-29 ENCOUNTER — Other Ambulatory Visit: Payer: Self-pay | Admitting: Internal Medicine

## 2018-02-06 DIAGNOSIS — R203 Hyperesthesia: Secondary | ICD-10-CM | POA: Diagnosis not present

## 2018-02-06 DIAGNOSIS — R234 Changes in skin texture: Secondary | ICD-10-CM | POA: Diagnosis not present

## 2018-02-06 DIAGNOSIS — D485 Neoplasm of uncertain behavior of skin: Secondary | ICD-10-CM | POA: Diagnosis not present

## 2018-02-06 DIAGNOSIS — H61001 Unspecified perichondritis of right external ear: Secondary | ICD-10-CM | POA: Diagnosis not present

## 2018-02-18 ENCOUNTER — Ambulatory Visit (INDEPENDENT_AMBULATORY_CARE_PROVIDER_SITE_OTHER): Payer: Medicare Other | Admitting: Adult Health

## 2018-02-18 ENCOUNTER — Encounter: Payer: Self-pay | Admitting: Internal Medicine

## 2018-02-18 VITALS — BP 125/51 | HR 69 | Temp 98.8°F | Resp 16 | Ht 62.5 in | Wt 116.2 lb

## 2018-02-18 DIAGNOSIS — R05 Cough: Secondary | ICD-10-CM

## 2018-02-18 DIAGNOSIS — J440 Chronic obstructive pulmonary disease with acute lower respiratory infection: Secondary | ICD-10-CM | POA: Diagnosis not present

## 2018-02-18 DIAGNOSIS — R059 Cough, unspecified: Secondary | ICD-10-CM

## 2018-02-18 MED ORDER — BENZONATATE 100 MG PO CAPS: 100.0000 mg | ORAL_CAPSULE | Freq: Two times a day (BID) | ORAL | 0 refills | Status: DC | PRN

## 2018-02-18 MED ORDER — LEVOFLOXACIN 500 MG PO TABS
500.0000 mg | ORAL_TABLET | Freq: Every day | ORAL | 0 refills | Status: DC
Start: 2018-02-18 — End: 2018-03-18

## 2018-02-18 NOTE — Progress Notes (Signed)
[facility] [place],  [address]  Internal MEDICINE  Office Visit Note  Patient Name: Olivia Lane  [id number]  [id number]  Date of Service: 03/06/2018  Chief Complaint  Patient presents with  . Bronchitis  . Cough    no fever or chills, hard coughing been going on for awhile     HPI Pt is here for a sick visit.  Pt reports she has been coughing for a few weeks with chest congestion.  She reports she is using her inhaler with some relief, however she has noticed some wheezing recently.  She denies fever or chills.  She denies using OTC medications.     Current Medication:  Outpatient Encounter Medications as of 02/18/2018  Medication Sig  . amLODipine (NORVASC) 10 MG tablet Take 10 mg by mouth at bedtime.   Marland Kitchen aspirin EC 81 MG tablet Take by mouth.  Marland Kitchen atorvastatin (LIPITOR) 40 MG tablet TAKE 1 TABLET BY MOUTH AT BEDTIME  . budesonide-formoterol (SYMBICORT) 80-4.5 MCG/ACT inhaler Inhale 1 puff into the lungs 2 (two) times daily.  . clopidogrel (PLAVIX) 75 MG tablet TAKE 1 TABLET BY MOUTH EVERY DAY  . ergocalciferol (DRISDOL) 50000 units capsule Take 1 capsule (50,000 Units total) by mouth once a week.  Marland Kitchen levofloxacin (LEVAQUIN) 500 MG tablet Take 1 tablet (500 mg total) by mouth daily.  . metoprolol tartrate (LOPRESSOR) 50 MG tablet TAKE 1 TABLET BY MOUTH EVERY 12 HOURS  . nitroGLYCERIN (NITROSTAT) 0.4 MG SL tablet Place under the tongue.  . ondansetron (ZOFRAN ODT) 4 MG disintegrating tablet Take 1 tablet (4 mg total) by mouth every 8 (eight) hours as needed for nausea or vomiting.  . pantoprazole (PROTONIX) 40 MG tablet TAKE 1 TABLET BY MOUTH 2 TIMES DAILY FORGASTRITIS AND ESOPHA STICTURE  . PROAIR HFA 108 (90 Base) MCG/ACT inhaler Inhale 1-2 puffs into the lungs every 6 (six) hours as needed for wheezing.  . [DISCONTINUED] levofloxacin (LEVAQUIN) 500 MG tablet Take 1 tablet (500 mg total) by mouth daily.  . benzonatate (TESSALON) 100 MG  capsule Take 1 capsule (100 mg total) by mouth 2 (two) times daily as needed for cough.   No facility-administered encounter medications on file as of 02/18/2018.       Medical History: Past Medical History:  Diagnosis Date  . Anemia   . Gastro-esophageal reflux   . Hyperlipidemia   . Hypertension      Vital Signs: BP (!) 125/51 (BP Location: Right Arm, Patient Position: Sitting, Cuff Size: Normal)   Pulse 69   Temp 98.8 F (37.1 C)   Resp 16   Ht 5' 2.5" (1.588 m)   Wt 116 lb 3.2 oz (52.7 kg)   SpO2 92%   BMI 20.91 kg/m    Review of Systems  Constitutional: Negative for chills, fatigue and unexpected weight change.  HENT: Negative for congestion, rhinorrhea, sneezing and sore throat.   Eyes: Negative for photophobia, pain and redness.  Respiratory: Negative for cough, chest tightness and shortness of breath.   Cardiovascular: Negative for chest pain and palpitations.  Gastrointestinal: Negative for abdominal pain, constipation, diarrhea, nausea and vomiting.  Endocrine: Negative.   Genitourinary: Negative for dysuria and frequency.  Musculoskeletal: Negative for arthralgias, back pain, joint swelling and neck pain.  Skin: Negative for rash.  Allergic/Immunologic: Negative.   Neurological: Negative for tremors and numbness.  Hematological: Negative for adenopathy. Does not bruise/bleed easily.  Psychiatric/Behavioral: Negative for behavioral problems and sleep disturbance. The patient is  not nervous/anxious.     Physical Exam  Constitutional: She is oriented to person, place, and time. She appears well-developed and well-nourished. No distress.  HENT:  Head: Normocephalic and atraumatic.  Mouth/Throat: Oropharynx is clear and moist. No oropharyngeal exudate.  Eyes: Pupils are equal, round, and reactive to light. EOM are normal.  Neck: Normal range of motion. Neck supple. No JVD present. No tracheal deviation present. No thyromegaly present.  Cardiovascular: Normal  rate, regular rhythm and normal heart sounds. Exam reveals no gallop and no friction rub.  No murmur heard. Pulmonary/Chest: Effort normal and breath sounds normal. No respiratory distress. She has no wheezes. She has no rales. She exhibits no tenderness.  Abdominal: Soft. There is no tenderness. There is no guarding.  Musculoskeletal: Normal range of motion.  Lymphadenopathy:    She has no cervical adenopathy.  Neurological: She is alert and oriented to person, place, and time. No cranial nerve deficit.  Skin: Skin is warm and dry. She is not diaphoretic.  Psychiatric: She has a normal mood and affect. Her behavior is normal. Judgment and thought content normal.  Nursing note and vitals reviewed.  Assessment/Plan: 1. COPD with lower respiratory infection ([place]) - Might need CXR however will Start levofloxacin (LEVAQUIN) 500 MG tablet; Take 1 tablet (500 mg total) by mouth daily.  Dispense: 10 tablet; Refill: 0  2. Cough in adult - Might need MDI will Start  benzonatate (TESSALON) 100 MG capsule; Take 1 capsule (100 mg total) by mouth 2 (two) times daily as needed for cough.  Dispense: 20 capsule; Refill: 0  General Counseling: naya ilagan understanding of the findings of todays visit and agrees with plan of treatment. I have discussed any further diagnostic evaluation that may be needed or ordered today. We also reviewed her medications today. she has been encouraged to call the office with any questions or concerns that should arise related to todays visit.   Meds ordered this encounter  Medications  . levofloxacin (LEVAQUIN) 500 MG tablet    Sig: Take 1 tablet (500 mg total) by mouth daily.    Dispense:  10 tablet    Refill:  0    Order Specific Question:   Supervising Provider    Answer:   Lavera Guise [[id number]]  . benzonatate (TESSALON) 100 MG capsule    Sig: Take 1 capsule (100 mg total) by mouth 2 (two) times daily as needed for cough.    Dispense:  20 capsule    Refill:  0     Order Specific Question:   Supervising Provider    Answer:   Lavera Guise [[id number]]    Time spent: 25 Minutes  This patient was seen by Orson Gear AGNP-C in Collaboration with Dr Lavera Guise as a part of collaborative care agreement

## 2018-02-18 NOTE — Patient Instructions (Signed)
Upper Respiratory Infection, Adult Most upper respiratory infections (URIs) are caused by a virus. A URI affects the nose, throat, and upper air passages. The most common type of URI is often called "the common cold." Follow these instructions at home:  Take medicines only as told by your doctor.  Gargle warm saltwater or take cough drops to comfort your throat as told by your doctor.  Use a warm mist humidifier or inhale steam from a shower to increase air moisture. This may make it easier to breathe.  Drink enough fluid to keep your pee (urine) clear or pale yellow.  Eat soups and other clear broths.  Have a healthy diet.  Rest as needed.  Go back to work when your fever is gone or your doctor says it is okay. ? You may need to stay home longer to avoid giving your URI to others. ? You can also wear a face mask and wash your hands often to prevent spread of the virus.  Use your inhaler more if you have asthma.  Do not use any tobacco products, including cigarettes, chewing tobacco, or electronic cigarettes. If you need help quitting, ask your doctor. Contact a doctor if:  You are getting worse, not better.  Your symptoms are not helped by medicine.  You have chills.  You are getting more short of breath.  You have brown or red mucus.  You have yellow or brown discharge from your nose.  You have pain in your face, especially when you bend forward.  You have a fever.  You have puffy (swollen) neck glands.  You have pain while swallowing.  You have white areas in the back of your throat. Get help right away if:  You have very bad or constant: ? Headache. ? Ear pain. ? Pain in your forehead, behind your eyes, and over your cheekbones (sinus pain). ? Chest pain.  You have long-lasting (chronic) lung disease and any of the following: ? Wheezing. ? Long-lasting cough. ? Coughing up blood. ? A change in your usual mucus.  You have a stiff neck.  You have  changes in your: ? Vision. ? Hearing. ? Thinking. ? Mood. This information is not intended to replace advice given to you by your health care provider. Make sure you discuss any questions you have with your health care provider. Document Released: 12/05/2007 Document Revised: 02/19/2016 Document Reviewed: 09/23/2013 Elsevier Interactive Patient Education  2018 Elsevier Inc.  

## 2018-03-13 ENCOUNTER — Other Ambulatory Visit: Payer: Self-pay | Admitting: Nurse Practitioner

## 2018-03-13 DIAGNOSIS — R944 Abnormal results of kidney function studies: Secondary | ICD-10-CM | POA: Diagnosis not present

## 2018-03-13 DIAGNOSIS — D473 Essential (hemorrhagic) thrombocythemia: Secondary | ICD-10-CM | POA: Diagnosis not present

## 2018-03-14 LAB — CBC
HEMATOCRIT: 38.2 % (ref 34.0–46.6)
HEMOGLOBIN: 12.3 g/dL (ref 11.1–15.9)
MCH: 26.7 pg (ref 26.6–33.0)
MCHC: 32.2 g/dL (ref 31.5–35.7)
MCV: 83 fL (ref 79–97)
Platelets: 687 10*3/uL — ABNORMAL HIGH (ref 150–450)
RBC: 4.61 x10E6/uL (ref 3.77–5.28)
RDW: 15.4 % (ref 12.3–15.4)
WBC: 10.1 10*3/uL (ref 3.4–10.8)

## 2018-03-14 LAB — BASIC METABOLIC PANEL
BUN/Creatinine Ratio: 8 — ABNORMAL LOW (ref 12–28)
BUN: 10 mg/dL (ref 8–27)
CALCIUM: 9.2 mg/dL (ref 8.7–10.3)
CO2: 18 mmol/L — AB (ref 20–29)
CREATININE: 1.29 mg/dL — AB (ref 0.57–1.00)
Chloride: 103 mmol/L (ref 96–106)
GFR calc Af Amer: 45 mL/min/{1.73_m2} — ABNORMAL LOW (ref >59)
GFR, EST NON AFRICAN AMERICAN: 39 mL/min/{1.73_m2} — AB (ref >59)
GLUCOSE: 97 mg/dL (ref 65–99)
POTASSIUM: 4.9 mmol/L (ref 3.5–5.2)
SODIUM: 142 mmol/L (ref 134–144)

## 2018-03-18 ENCOUNTER — Ambulatory Visit (INDEPENDENT_AMBULATORY_CARE_PROVIDER_SITE_OTHER): Payer: Medicare Other | Admitting: Nurse Practitioner

## 2018-03-18 ENCOUNTER — Encounter: Payer: Self-pay | Admitting: Nurse Practitioner

## 2018-03-18 VITALS — BP 130/80 | HR 67 | Resp 16 | Ht 62.5 in | Wt 113.6 lb

## 2018-03-18 DIAGNOSIS — K2100 Gastro-esophageal reflux disease with esophagitis, without bleeding: Secondary | ICD-10-CM

## 2018-03-18 DIAGNOSIS — Z1239 Encounter for other screening for malignant neoplasm of breast: Secondary | ICD-10-CM

## 2018-03-18 DIAGNOSIS — I10 Essential (primary) hypertension: Secondary | ICD-10-CM

## 2018-03-18 DIAGNOSIS — D473 Essential (hemorrhagic) thrombocythemia: Secondary | ICD-10-CM

## 2018-03-18 DIAGNOSIS — Z0001 Encounter for general adult medical examination with abnormal findings: Secondary | ICD-10-CM

## 2018-03-18 DIAGNOSIS — D75839 Thrombocytosis, unspecified: Secondary | ICD-10-CM

## 2018-03-18 DIAGNOSIS — Z1231 Encounter for screening mammogram for malignant neoplasm of breast: Secondary | ICD-10-CM

## 2018-03-18 MED ORDER — DEXLANSOPRAZOLE 60 MG PO CPDR: 60.0000 mg | DELAYED_RELEASE_CAPSULE | Freq: Every day | ORAL | 5 refills | Status: DC

## 2018-03-18 NOTE — Progress Notes (Signed)
[facility] [place], [place] [address]  Internal MEDICINE  Office Visit Note  Patient Name: Olivia Lane  [id number]  [id number]  Date of Service: 03/26/2018   Pt is here for routine health maintenance examination  Chief Complaint  Patient presents with  . Hypertension  . Hyperlipidemia  . Gastroesophageal Reflux    causing nausea  . Annual Exam     COPD  She complains of cough, shortness of breath and wheezing. This is a chronic problem. The current episode started more than 1 year ago. The problem occurs intermittently. The problem has been waxing and waning. The cough is productive of sputum. Associated symptoms include dyspnea on exertion, headaches, malaise/fatigue and nasal congestion. Pertinent negatives include no chest pain, fever, myalgias, postnasal drip, rhinorrhea, sneezing or sore throat. Her symptoms are aggravated by pollen and URI. Her symptoms are alleviated by beta-agonist and steroid inhaler. She reports moderate improvement on treatment. Her past medical history is significant for COPD.     Current Medication: Outpatient Encounter Medications as of 03/18/2018  Medication Sig Note  . amLODipine (NORVASC) 10 MG tablet Take 10 mg by mouth at bedtime.    Marland Kitchen aspirin EC 81 MG tablet Take by mouth.   Marland Kitchen atorvastatin (LIPITOR) 40 MG tablet TAKE 1 TABLET BY MOUTH AT BEDTIME   . benzonatate (TESSALON) 100 MG capsule Take 1 capsule (100 mg total) by mouth 2 (two) times daily as needed for cough. (Patient not taking: Reported on 03/24/2018)   . budesonide-formoterol (SYMBICORT) 80-4.5 MCG/ACT inhaler Inhale 1 puff into the lungs 2 (two) times daily.   . clopidogrel (PLAVIX) 75 MG tablet TAKE 1 TABLET BY MOUTH EVERY DAY   . ergocalciferol (DRISDOL) 50000 units capsule Take 1 capsule (50,000 Units total) by mouth once a week. (Patient not taking: Reported on 03/24/2018)   . metoprolol tartrate (LOPRESSOR) 50 MG tablet TAKE 1 TABLET BY MOUTH  EVERY 12 HOURS   . nitroGLYCERIN (NITROSTAT) 0.4 MG SL tablet Place under the tongue.   Marland Kitchen PROAIR HFA 108 (90 Base) MCG/ACT inhaler Inhale 1-2 puffs into the lungs every 6 (six) hours as needed for wheezing. (Patient not taking: Reported on 03/24/2018)   . [DISCONTINUED] pantoprazole (PROTONIX) 40 MG tablet TAKE 1 TABLET BY MOUTH 2 TIMES DAILY FORGASTRITIS AND ESOPHA STICTURE 03/18/2018: not working. changed to dexilant.  Marland Kitchen dexlansoprazole (DEXILANT) 60 MG capsule Take 1 capsule (60 mg total) by mouth daily.   . ondansetron (ZOFRAN ODT) 4 MG disintegrating tablet Take 1 tablet (4 mg total) by mouth every 8 (eight) hours as needed for nausea or vomiting.   . [DISCONTINUED] levofloxacin (LEVAQUIN) 500 MG tablet Take 1 tablet (500 mg total) by mouth daily. (Patient not taking: Reported on 03/18/2018)    No facility-administered encounter medications on file as of 03/18/2018.     Surgical History: Past Surgical History:  Procedure Laterality Date  . APPENDECTOMY  2004  . BYPASS GRAFT  2014  . CATARACT EXTRACTION  [id number]  . COLONOSCOPY  2004    Medical History: Past Medical History:  Diagnosis Date  . Anemia   . Gastro-esophageal reflux   . Hyperlipidemia   . Hypertension     Family History: Family History  Problem Relation Age of Onset  . Hypertension Father   . Coronary artery disease Sister   . Hypertension Sister   . Diabetes Brother   . Hypertension Brother       Review of Systems  Constitutional: Positive for malaise/fatigue.  Negative for activity change, chills, fatigue and fever.  HENT: Negative for congestion, postnasal drip, rhinorrhea, sneezing, sore throat and voice change.   Eyes: Negative.   Respiratory: Positive for cough, shortness of breath and wheezing.        Intermittent and chronic  Cardiovascular: Positive for dyspnea on exertion. Negative for chest pain and palpitations.  Gastrointestinal: Negative for constipation, diarrhea, nausea and vomiting.        Worsening acid reflux  Endocrine: Negative for cold intolerance, heat intolerance, polydipsia, polyphagia and polyuria.  Genitourinary: Negative.   Musculoskeletal: Negative for arthralgias, back pain and myalgias.  Skin: Negative for rash.  Allergic/Immunologic: Positive for environmental allergies.  Neurological: Positive for headaches. Negative for dizziness.  Hematological: Negative for adenopathy.  Psychiatric/Behavioral: Negative for dysphoric mood. The patient is not nervous/anxious.     Today's Vitals   03/18/18 1023  BP: 130/80  Pulse: 67  Resp: 16  SpO2: 96%  Weight: 113 lb 9.6 oz (51.5 kg)  Height: 5' 2.5" (1.588 m)    Physical Exam  Constitutional: She is oriented to person, place, and time. She appears well-developed and well-nourished.  HENT:  Head: Normocephalic.  Nose: Nose normal.  Mouth/Throat: Oropharynx is clear and moist.  Eyes: Pupils are equal, round, and reactive to light. Conjunctivae and EOM are normal.  Neck: Normal range of motion. Neck supple. No JVD present. Carotid bruit is not present. No tracheal deviation present. No thyromegaly present.  Cardiovascular: Normal rate, regular rhythm, normal heart sounds and intact distal pulses.  Pulmonary/Chest: Effort normal and breath sounds normal. No respiratory distress. She has no wheezes.  Abdominal: Soft. Bowel sounds are normal. There is no tenderness.  Musculoskeletal: Normal range of motion.  Lymphadenopathy:    She has no cervical adenopathy.  Neurological: She is alert and oriented to person, place, and time. She displays normal reflexes. No cranial nerve deficit.  Skin: Skin is warm and dry. Capillary refill takes 2 to 3 seconds.  Psychiatric: She has a normal mood and affect. Her behavior is normal. Judgment and thought content normal.  Nursing note and vitals reviewed.  Depression screen [facility] 2/9 03/18/2018 02/18/2018 11/15/2017 07/11/2017  Decreased Interest 0 0 0 0  Down, Depressed, Hopeless 0 0  0 0  PHQ - 2 Score 0 0 0 0    Functional Status Survey: Is the patient deaf or have difficulty hearing?: No Does the patient have difficulty seeing, even when wearing glasses/contacts?: No Does the patient have difficulty concentrating, remembering, or making decisions?: No Does the patient have difficulty walking or climbing stairs?: No Does the patient have difficulty dressing or bathing?: No Does the patient have difficulty doing errands alone such as visiting a doctor's office or shopping?: No  MMSE - [place] Exam 03/18/2018  Orientation to time 5  Orientation to Place 5  Registration 3  Attention/ Calculation 5  Recall 3  Language- name 2 objects 2  Language- repeat 1  Language- follow 3 step command 3  Language- read & follow direction 1  Write a sentence 0  Copy design 1  Total score 29    Fall Risk  03/18/2018 02/18/2018 11/15/2017 07/11/2017  Falls in the past year? No No No No     LABS: Recent Results (from the past 2160 hour(s))  CBC     Status: Abnormal   Collection Time: 03/13/18  8:19 AM  Result Value Ref Range   WBC 10.1 3.4 - 10.8 x10E3/uL   RBC 4.61 3.77 -  5.28 x10E6/uL   Hemoglobin 12.3 11.1 - 15.9 g/dL   Hematocrit 38.2 34.0 - 46.6 %   MCV 83 79 - 97 fL   MCH 26.7 26.6 - 33.0 pg   MCHC 32.2 31.5 - 35.7 g/dL   RDW 15.4 12.3 - 15.4 %   Platelets 687 (H) 150 - 450 G25K2/HC  Basic metabolic panel     Status: Abnormal   Collection Time: 03/13/18  8:19 AM  Result Value Ref Range   Glucose 97 65 - 99 mg/dL   BUN 10 8 - 27 mg/dL   Creatinine, Ser 1.29 (H) 0.57 - 1.00 mg/dL   GFR calc non Af Amer 39 (L) >59 mL/min/1.73   GFR calc Af Amer 45 (L) >59 mL/min/1.73   BUN/Creatinine Ratio 8 (L) 12 - 28   Sodium 142 134 - 144 mmol/L   Potassium 4.9 3.5 - 5.2 mmol/L   Chloride 103 96 - 106 mmol/L   CO2 18 (L) 20 - 29 mmol/L   Calcium 9.2 8.7 - 10.3 mg/dL  UA/M w/rflx Culture, Routine     Status: None   Collection Time: 03/18/18 11:31 AM  Result  Value Ref Range   Specific Gravity, UA 1.018 1.005 - 1.030   pH, UA 5.5 5.0 - 7.5   Color, UA Yellow Yellow   Appearance Ur Clear Clear   Leukocytes, UA Negative Negative   Protein, UA Negative Negative/Trace   Glucose, UA Negative Negative   Ketones, UA Negative Negative   RBC, UA Negative Negative   Bilirubin, UA Negative Negative   Urobilinogen, Ur 0.2 0.2 - 1.0 mg/dL   Nitrite, UA Negative Negative   Microscopic Examination Comment     Comment: Microscopic follows if indicated.   Microscopic Examination See below:     Comment: Microscopic was indicated and was performed.   Urinalysis Reflex Comment     Comment: This specimen will not reflex to a Urine Culture.  Microscopic Examination     Status: None   Collection Time: 03/18/18 11:31 AM  Result Value Ref Range   WBC, UA 0-5 0 - 5 /hpf   RBC, UA 0-2 0 - 2 /hpf   Epithelial Cells (non renal) 0-10 0 - 10 /hpf   Casts None seen None seen /lpf   Mucus, UA Present Not Estab.   Bacteria, UA None seen None seen/Few  Folate     Status: None   Collection Time: 03/24/18  2:09 PM  Result Value Ref Range   Folate 8.6 >5.9 ng/mL    Comment: Performed at [facility], [address] [place], [place] [address]  Vitamin B12     Status: None   Collection Time: 03/24/18  2:09 PM  Result Value Ref Range   Vitamin B-12 192 180 - 914 pg/mL    Comment: (NOTE) This assay is not validated for testing neonatal or myeloproliferative syndrome specimens for Vitamin B12 levels. Performed at [facility], [place] [address], [place], [place] [address]   Iron and TIBC     Status: Abnormal   Collection Time: 03/24/18  2:09 PM  Result Value Ref Range   Iron 24 (L) 28 - 170 ug/dL   TIBC 306 250 - 450 ug/dL   Saturation Ratios 8 (L) 10.4 - 31.8 %   UIBC 282 ug/dL    Comment: Performed at [facility], [address], [place], [place] [address]  Ferritin     Status: None   Collection Time: 03/24/18  2:09 PM  Result Value  Ref Range   Ferritin 49 11 - 307 ng/mL    Comment: Performed at [facility], [address] [place], [place] [address]  CBC     Status: Abnormal   Collection Time: 03/24/18  2:09 PM  Result Value Ref Range   WBC 9.5 3.6 - 11.0 K/uL   RBC 4.51 3.80 - 5.20 MIL/uL   Hemoglobin 12.0 12.0 - 16.0 g/dL   HCT 36.9 35.0 - 47.0 %   MCV 82.0 80.0 - 100.0 fL   MCH 26.7 26.0 - 34.0 pg   MCHC 32.5 32.0 - 36.0 g/dL   RDW 16.6 (H) 11.5 - 14.5 %   Platelets 655 (H) 150 - 440 K/uL    Comment: Performed at [facility], [address] [place], [place] [address]   Assessment/Plan: 1. Encounter for general adult medical examination with abnormal findings Annual wellness visit today.  - UA/M w/rflx Culture, Routine  2. Essential hypertension Stable. Continue bp medication as prescribed.   3. Gastro-esophageal reflux disease with esophagitis Start dexilant. Avoid triggers. Try sleeping with HOB elevated at [place]. Reassess at next visit.  - dexlansoprazole (DEXILANT) 60 MG capsule; Take 1 capsule (60 mg total) by mouth daily.  Dispense: 30 capsule; Refill: 5  4. Thrombocytosis ([place]) Recent labs show platelet count climbing. Now 687. Refer to hematology for further evaluation. - Ambulatory referral to Hematology  5. Screening for breast cancer - MM DIGITAL SCREENING BILATERAL; Future  General Counseling: onesty clair understanding of the findings of todays visit and agrees with plan of treatment. I have discussed any further diagnostic evaluation that may be needed or ordered today. We also reviewed her medications today. she has been encouraged to call the office with any questions or concerns that should arise related to todays visit.    Counseling:  This patient was seen by Leretha Pol FNP Collaboration with Dr Lavera Guise as a part of collaborative care agreement  Orders Placed This Encounter  Procedures  . Microscopic Examination  . MM DIGITAL SCREENING  BILATERAL  . UA/M w/rflx Culture, Routine  . Ambulatory referral to Hematology    Meds ordered this encounter  Medications  . dexlansoprazole (DEXILANT) 60 MG capsule    Sig: Take 1 capsule (60 mg total) by mouth daily.    Dispense:  30 capsule    Refill:  5    Moderate. Protonix, even twice daily is not controlling symptoms of GERD    Order Specific Question:   Supervising Provider    Answer:   Lavera Guise [[id number]]    Time spent: [place], MD  Internal Medicine

## 2018-03-19 LAB — MICROSCOPIC EXAMINATION
Bacteria, UA: NONE SEEN
Casts: NONE SEEN /lpf

## 2018-03-19 LAB — UA/M W/RFLX CULTURE, ROUTINE
BILIRUBIN UA: NEGATIVE
Glucose, UA: NEGATIVE
Ketones, UA: NEGATIVE
Leukocytes, UA: NEGATIVE
NITRITE UA: NEGATIVE
Protein, UA: NEGATIVE
RBC, UA: NEGATIVE
SPEC GRAV UA: 1.018 (ref 1.005–1.030)
Urobilinogen, Ur: 0.2 mg/dL (ref 0.2–1.0)
pH, UA: 5.5 (ref 5.0–7.5)

## 2018-03-23 NOTE — Progress Notes (Signed)
[place]  Telephone:(336) [phone] Fax:(336) [phone]  ID: Jerrell Belfast Lem OB: [birth year]  MR#: [id number]  NKN#:[id number]  Patient Care Team: Lavera Guise, MD as PCP - General (Internal Medicine)  CHIEF COMPLAINT: Thrombocytosis  INTERVAL HISTORY: Patient is an 82 year old female who was noted to have a persistently elevated platelet count on routine blood work.  She currently feels well and is asymptomatic.  She has no neurologic complaints.  She denies any recent fevers or illnesses.  She has good appetite and denies weight loss.  She has no chest pain or shortness of breath.  She denies any nausea, vomiting, constipation, or diarrhea.  She has no urinary complaints.  Patient feels at her baseline offers no specific complaints today.  REVIEW OF SYSTEMS:   Review of Systems  Constitutional: Negative.  Negative for fever, malaise/fatigue and weight loss.  Respiratory: Negative.  Negative for cough, hemoptysis and shortness of breath.   Cardiovascular: Negative.  Negative for chest pain and leg swelling.  Gastrointestinal: Negative.  Negative for abdominal pain.  Genitourinary: Negative.  Negative for dysuria.  Musculoskeletal: Negative.  Negative for back pain.  Skin: Negative.  Negative for rash.  Neurological: Negative.  Negative for focal weakness, weakness and headaches.  Psychiatric/Behavioral: Negative.  The patient is not nervous/anxious.     As per HPI. Otherwise, a complete review of systems is negative.  PAST MEDICAL HISTORY: Past Medical History:  Diagnosis Date  . Anemia   . Gastro-esophageal reflux   . Hyperlipidemia   . Hypertension     PAST SURGICAL HISTORY: Past Surgical History:  Procedure Laterality Date  . APPENDECTOMY  2004  . BYPASS GRAFT  2014  . CATARACT EXTRACTION  [id number]  . COLONOSCOPY  2004    FAMILY HISTORY: Family History  Problem Relation Age of Onset  . Hypertension Father   . Coronary artery disease  Sister   . Hypertension Sister   . Diabetes Brother   . Hypertension Brother     ADVANCED DIRECTIVES (Y/N):  N  HEALTH MAINTENANCE: Social History   Tobacco Use  . Smoking status: Former Smoker    Packs/day: 1.00    Years: 40.00    Pack years: 40.00    Types: Cigarettes    Last attempt to quit: 03/24/2013    Years since quitting: 5.0  . Smokeless tobacco: Never Used  Substance Use Topics  . Alcohol use: No  . Drug use: No     Colonoscopy:  PAP:  Bone density:  Lipid panel:  No Known Allergies  Current Outpatient Medications  Medication Sig Dispense Refill  . amLODipine (NORVASC) 10 MG tablet Take 10 mg by mouth at bedtime.     Marland Kitchen atorvastatin (LIPITOR) 40 MG tablet TAKE 1 TABLET BY MOUTH AT BEDTIME 90 tablet 3  . budesonide-formoterol (SYMBICORT) 80-4.5 MCG/ACT inhaler Inhale 1 puff into the lungs 2 (two) times daily.    . clopidogrel (PLAVIX) 75 MG tablet TAKE 1 TABLET BY MOUTH EVERY DAY 90 tablet 2  . dexlansoprazole (DEXILANT) 60 MG capsule Take 1 capsule (60 mg total) by mouth daily. 30 capsule 5  . metoprolol tartrate (LOPRESSOR) 50 MG tablet TAKE 1 TABLET BY MOUTH EVERY 12 HOURS 180 tablet 1  . ondansetron (ZOFRAN ODT) 4 MG disintegrating tablet Take 1 tablet (4 mg total) by mouth every 8 (eight) hours as needed for nausea or vomiting. 20 tablet 1  . aspirin EC 81 MG tablet Take by mouth.    . benzonatate (TESSALON) 100  MG capsule Take 1 capsule (100 mg total) by mouth 2 (two) times daily as needed for cough. (Patient not taking: Reported on 03/24/2018) 20 capsule 0  . ergocalciferol (DRISDOL) 50000 units capsule Take 1 capsule (50,000 Units total) by mouth once a week. (Patient not taking: Reported on 03/24/2018) 4 capsule 5  . nitroGLYCERIN (NITROSTAT) 0.4 MG SL tablet Place under the tongue.    Marland Kitchen PROAIR HFA 108 (90 Base) MCG/ACT inhaler Inhale 1-2 puffs into the lungs every 6 (six) hours as needed for wheezing. (Patient not taking: Reported on 03/24/2018) 1 Inhaler 5     No current facility-administered medications for this visit.     OBJECTIVE: Vitals:   03/24/18 1332  BP: 117/68  Pulse: 78  Resp: 18  Temp: 98.7 F (37.1 C)     Body mass index is 20.45 kg/m.    ECOG FS:0 - Asymptomatic  General: Well-developed, well-nourished, no acute distress. Eyes: Pink conjunctiva, anicteric sclera. HEENT: Normocephalic, moist mucous membranes, clear oropharnyx. Lungs: Clear to auscultation bilaterally. Heart: Regular rate and rhythm. No rubs, murmurs, or gallops. Abdomen: Soft, nontender, nondistended. No organomegaly noted, normoactive bowel sounds. Musculoskeletal: No edema, cyanosis, or clubbing. Neuro: Alert, answering all questions appropriately. Cranial nerves grossly intact. Skin: No rashes or petechiae noted. Psych: Normal affect. Lymphatics: No cervical, calvicular, axillary or inguinal LAD.   LAB RESULTS:  Lab Results  Component Value Date   NA 142 03/13/2018   K 4.9 03/13/2018   CL 103 03/13/2018   CO2 18 (L) 03/13/2018   GLUCOSE 97 03/13/2018   BUN 10 03/13/2018   CREATININE 1.29 (H) 03/13/2018   CALCIUM 9.2 03/13/2018   PROT 6.7 07/08/2017   ALBUMIN 3.8 07/08/2017   AST 16 07/08/2017   ALT 9 07/08/2017   ALKPHOS 145 (H) 07/08/2017   BILITOT 0.3 07/08/2017   GFRNONAA 39 (L) 03/13/2018   GFRAA 45 (L) 03/13/2018    Lab Results  Component Value Date   WBC 9.5 03/24/2018   HGB 12.0 03/24/2018   HCT 36.9 03/24/2018   MCV 82.0 03/24/2018   PLT 655 (H) 03/24/2018   Lab Results  Component Value Date   IRON 24 (L) 03/24/2018   TIBC 306 03/24/2018   IRONPCTSAT 8 (L) 03/24/2018   Lab Results  Component Value Date   FERRITIN 49 03/24/2018     STUDIES: No results found.  ASSESSMENT: Thrombocytosis  PLAN:    1. Thrombocytosis: Possibly secondary to iron deficiency.  Patient has decreased iron stores with a normal hemoglobin.  All of her other laboratory work including JAK-2 mutation is either negative or within  normal limits.  No intervention is needed at this time.  If patient cannot tolerate oral iron supplementation, she may benefit from IV iron in the future.  Return to clinic in 4 weeks with repeat laboratory work and further evaluation.   I spent a total of 45 minutes face-to-face with the patient of which greater than 50% of the visit was spent in counseling and coordination of care as detailed above.  Patient expressed understanding and was in agreement with this plan. She also understands that She can call clinic at any time with any questions, concerns, or complaints.   Cancer Staging No matching staging information was found for the patient.  Lloyd Huger, MD   03/28/2018 12:52 PM

## 2018-03-24 ENCOUNTER — Inpatient Hospital Stay: Payer: Medicare Other

## 2018-03-24 ENCOUNTER — Inpatient Hospital Stay: Payer: Medicare Other | Attending: Oncology | Admitting: Oncology

## 2018-03-24 ENCOUNTER — Other Ambulatory Visit: Payer: Self-pay

## 2018-03-24 ENCOUNTER — Encounter: Payer: Self-pay | Admitting: Oncology

## 2018-03-24 VITALS — BP 117/68 | HR 78 | Temp 98.7°F | Resp 18 | Ht 62.6 in | Wt 114.0 lb

## 2018-03-24 DIAGNOSIS — D75839 Thrombocytosis, unspecified: Secondary | ICD-10-CM

## 2018-03-24 DIAGNOSIS — Z87891 Personal history of nicotine dependence: Secondary | ICD-10-CM | POA: Diagnosis not present

## 2018-03-24 DIAGNOSIS — D473 Essential (hemorrhagic) thrombocythemia: Secondary | ICD-10-CM | POA: Diagnosis not present

## 2018-03-24 LAB — IRON AND TIBC
IRON: 24 ug/dL — AB (ref 28–170)
Saturation Ratios: 8 % — ABNORMAL LOW (ref 10.4–31.8)
TIBC: 306 ug/dL (ref 250–450)
UIBC: 282 ug/dL

## 2018-03-24 LAB — CBC
HCT: 36.9 % (ref 35.0–47.0)
Hemoglobin: 12 g/dL (ref 12.0–16.0)
MCH: 26.7 pg (ref 26.0–34.0)
MCHC: 32.5 g/dL (ref 32.0–36.0)
MCV: 82 fL (ref 80.0–100.0)
PLATELETS: 655 10*3/uL — AB (ref 150–440)
RBC: 4.51 MIL/uL (ref 3.80–5.20)
RDW: 16.6 % — ABNORMAL HIGH (ref 11.5–14.5)
WBC: 9.5 10*3/uL (ref 3.6–11.0)

## 2018-03-24 LAB — VITAMIN B12: Vitamin B-12: 192 pg/mL (ref 180–914)

## 2018-03-24 LAB — FERRITIN: Ferritin: 49 ng/mL (ref 11–307)

## 2018-03-24 LAB — FOLATE: Folate: 8.6 ng/mL (ref >5.9)

## 2018-03-24 NOTE — Progress Notes (Signed)
Here for new pt evaluation.  

## 2018-03-27 LAB — JAK2 GENOTYPR

## 2018-04-04 ENCOUNTER — Other Ambulatory Visit: Payer: Self-pay | Admitting: Internal Medicine

## 2018-04-27 NOTE — Progress Notes (Signed)
[place]  [phone]) [phone] Fax:(336) [phone]  ID: Olivia Lane OB: 04/18/1936  MR#: [id number]  IRW#:[id number]  Patient Care Team: Lavera Guise, MD as PCP - General (Internal Medicine)  CHIEF COMPLAINT: Thrombocytosis  INTERVAL HISTORY: Patient returns to clinic today for repeat laboratory work and further evaluation.  She continues to feel well and remains asymptomatic. She has no neurologic complaints.  She denies any recent fevers or illnesses.  She has good appetite and denies weight loss.  She has no chest pain or shortness of breath.  She denies any nausea, vomiting, constipation, or diarrhea.  She denies any melena or hematochezia.  She has no urinary complaints.  Patient offers no specific complaints today.  REVIEW OF SYSTEMS:   Review of Systems  Constitutional: Negative.  Negative for fever, malaise/fatigue and weight loss.  Respiratory: Negative.  Negative for cough, hemoptysis and shortness of breath.   Cardiovascular: Negative.  Negative for chest pain and leg swelling.  Gastrointestinal: Negative.  Negative for abdominal pain.  Genitourinary: Negative.  Negative for dysuria.  Musculoskeletal: Negative.  Negative for back pain.  Skin: Negative.  Negative for rash.  Neurological: Negative.  Negative for focal weakness, weakness and headaches.  Psychiatric/Behavioral: Negative.  The patient is not nervous/anxious.     As per HPI. Otherwise, a complete review of systems is negative.  PAST MEDICAL HISTORY: Past Medical History:  Diagnosis Date  . Anemia   . Gastro-esophageal reflux   . Hyperlipidemia   . Hypertension     PAST SURGICAL HISTORY: Past Surgical History:  Procedure Laterality Date  . APPENDECTOMY  2004  . BYPASS GRAFT  2014  . CATARACT EXTRACTION  [id number]  . COLONOSCOPY  2004    FAMILY HISTORY: Family History  Problem Relation Age of Onset  . Hypertension Father   . Coronary artery disease Sister   .  Hypertension Sister   . Diabetes Brother   . Hypertension Brother     ADVANCED DIRECTIVES (Y/N):  N  HEALTH MAINTENANCE: Social History   Tobacco Use  . Smoking status: Former Smoker    Packs/day: 1.00    Years: 40.00    Pack years: 40.00    Types: Cigarettes    Last attempt to quit: 03/24/2013    Years since quitting: 5.1  . Smokeless tobacco: Never Used  Substance Use Topics  . Alcohol use: No  . Drug use: No     Colonoscopy:  PAP:  Bone density:  Lipid panel:  No Known Allergies  Current Outpatient Medications  Medication Sig Dispense Refill  . amLODipine (NORVASC) 10 MG tablet TAKE ONE TABLET BY MOUTH EVERY NIGHT FORHYPERTENSION 90 tablet 1  . aspirin EC 81 MG tablet Take by mouth.    Marland Kitchen atorvastatin (LIPITOR) 40 MG tablet TAKE 1 TABLET BY MOUTH AT BEDTIME 90 tablet 3  . budesonide-formoterol (SYMBICORT) 80-4.5 MCG/ACT inhaler Inhale 1 puff into the lungs 2 (two) times daily.    . clopidogrel (PLAVIX) 75 MG tablet TAKE 1 TABLET BY MOUTH EVERY DAY 90 tablet 2  . dexlansoprazole (DEXILANT) 60 MG capsule Take 1 capsule (60 mg total) by mouth daily. 30 capsule 5  . metoprolol tartrate (LOPRESSOR) 50 MG tablet TAKE 1 TABLET BY MOUTH EVERY 12 HOURS 180 tablet 1  . nitroGLYCERIN (NITROSTAT) 0.4 MG SL tablet Place under the tongue.    . ondansetron (ZOFRAN ODT) 4 MG disintegrating tablet Take 1 tablet (4 mg total) by mouth every 8 (eight) hours as needed for  nausea or vomiting. 20 tablet 1  . benzonatate (TESSALON) 100 MG capsule Take 1 capsule (100 mg total) by mouth 2 (two) times daily as needed for cough. (Patient not taking: Reported on 04/28/2018) 20 capsule 0  . ergocalciferol (DRISDOL) 50000 units capsule Take 1 capsule (50,000 Units total) by mouth once a week. (Patient not taking: Reported on 03/24/2018) 4 capsule 5  . PROAIR HFA 108 (90 Base) MCG/ACT inhaler Inhale 1-2 puffs into the lungs every 6 (six) hours as needed for wheezing. (Patient not taking: Reported on  03/24/2018) 1 Inhaler 5   No current facility-administered medications for this visit.     OBJECTIVE: Vitals:   04/28/18 1102  BP: 127/68  Pulse: 68  Resp: 20  Temp: 98.5 F (36.9 C)     Body mass index is 19.88 kg/m.    ECOG FS:0 - Asymptomatic  General: Well-developed, well-nourished, no acute distress. Eyes: Pink conjunctiva, anicteric sclera. HEENT: Normocephalic, moist mucous membranes. Lungs: Clear to auscultation bilaterally. Heart: Regular rate and rhythm. No rubs, murmurs, or gallops. Abdomen: Soft, nontender, nondistended. No organomegaly noted, normoactive bowel sounds. Musculoskeletal: No edema, cyanosis, or clubbing. Neuro: Alert, answering all questions appropriately. Cranial nerves grossly intact. Skin: No rashes or petechiae noted. Psych: Normal affect.  LAB RESULTS:  Lab Results  Component Value Date   NA 142 03/13/2018   K 4.9 03/13/2018   CL 103 03/13/2018   CO2 18 (L) 03/13/2018   GLUCOSE 97 03/13/2018   BUN 10 03/13/2018   CREATININE 1.29 (H) 03/13/2018   CALCIUM 9.2 03/13/2018   PROT 6.7 07/08/2017   ALBUMIN 3.8 07/08/2017   AST 16 07/08/2017   ALT 9 07/08/2017   ALKPHOS 145 (H) 07/08/2017   BILITOT 0.3 07/08/2017   GFRNONAA 39 (L) 03/13/2018   GFRAA 45 (L) 03/13/2018    Lab Results  Component Value Date   WBC 9.5 04/28/2018   NEUTROABS 6.4 04/28/2018   HGB 11.6 (L) 04/28/2018   HCT 38.5 04/28/2018   MCV 81.9 04/28/2018   PLT 640 (H) 04/28/2018   Lab Results  Component Value Date   IRON 23 (L) 04/28/2018   TIBC 288 04/28/2018   IRONPCTSAT 8 (L) 04/28/2018   Lab Results  Component Value Date   FERRITIN 67 04/28/2018     STUDIES: No results found.  ASSESSMENT: Thrombocytosis  PLAN:    1. Thrombocytosis: Possibly secondary to iron deficiency.  Patient's hemoglobin has trended down slightly and her platelets remain elevated.  Previously, all of her other laboratory work including JAK-2 mutation was either negative or within  normal limits.  Have recommended patient initiate oral iron supplementation.  If she cannot tolerate this, she may benefit from IV iron in the future.  Return to clinic in 4 months with repeat laboratory work and further evaluation. 2.  Iron deficiency anemia: Oral iron supplementation as above.    Patient expressed understanding and was in agreement with this plan. She also understands that She can call clinic at any time with any questions, concerns, or complaints.    Lloyd Huger, MD   04/30/2018 6:34 AM

## 2018-04-28 ENCOUNTER — Encounter: Payer: Self-pay | Admitting: Oncology

## 2018-04-28 ENCOUNTER — Other Ambulatory Visit: Payer: Self-pay

## 2018-04-28 ENCOUNTER — Inpatient Hospital Stay: Payer: Medicare Other | Attending: Oncology

## 2018-04-28 ENCOUNTER — Inpatient Hospital Stay (HOSPITAL_BASED_OUTPATIENT_CLINIC_OR_DEPARTMENT_OTHER): Payer: Medicare Other | Admitting: Oncology

## 2018-04-28 VITALS — BP 127/68 | HR 68 | Temp 98.5°F | Resp 20 | Wt 110.8 lb

## 2018-04-28 DIAGNOSIS — D473 Essential (hemorrhagic) thrombocythemia: Secondary | ICD-10-CM

## 2018-04-28 DIAGNOSIS — D509 Iron deficiency anemia, unspecified: Secondary | ICD-10-CM

## 2018-04-28 DIAGNOSIS — D75839 Thrombocytosis, unspecified: Secondary | ICD-10-CM

## 2018-04-28 LAB — CBC WITH DIFFERENTIAL/PLATELET
Abs Immature Granulocytes: 0.07 10*3/uL (ref 0.00–0.07)
Basophils Absolute: 0.1 10*3/uL (ref 0.0–0.1)
Basophils Relative: 1 %
EOS ABS: 0.5 10*3/uL (ref 0.0–0.5)
Eosinophils Relative: 5 %
HEMATOCRIT: 38.5 % (ref 36.0–46.0)
Hemoglobin: 11.6 g/dL — ABNORMAL LOW (ref 12.0–15.0)
IMMATURE GRANULOCYTES: 1 %
LYMPHS ABS: 1.3 10*3/uL (ref 0.7–4.0)
Lymphocytes Relative: 14 %
MCH: 24.7 pg — ABNORMAL LOW (ref 26.0–34.0)
MCHC: 30.1 g/dL (ref 30.0–36.0)
MCV: 81.9 fL (ref 80.0–100.0)
MONOS PCT: 11 %
Monocytes Absolute: 1 10*3/uL (ref 0.1–1.0)
NEUTROS PCT: 68 %
Neutro Abs: 6.4 10*3/uL (ref 1.7–7.7)
Platelets: 640 10*3/uL — ABNORMAL HIGH (ref 150–400)
RBC: 4.7 MIL/uL (ref 3.87–5.11)
RDW: 15.9 % — AB (ref 11.5–15.5)
WBC: 9.5 10*3/uL (ref 4.0–10.5)
nRBC: 0 % (ref 0.0–0.2)

## 2018-04-28 LAB — IRON AND TIBC
IRON: 23 ug/dL — AB (ref 28–170)
Saturation Ratios: 8 % — ABNORMAL LOW (ref 10.4–31.8)
TIBC: 288 ug/dL (ref 250–450)
UIBC: 265 ug/dL

## 2018-04-28 LAB — FERRITIN: Ferritin: 67 ng/mL (ref 11–307)

## 2018-04-28 NOTE — Progress Notes (Signed)
Patient denies any concerns today.  

## 2018-05-13 ENCOUNTER — Other Ambulatory Visit: Payer: Self-pay | Admitting: Nurse Practitioner

## 2018-05-22 ENCOUNTER — Other Ambulatory Visit: Payer: Self-pay

## 2018-05-22 MED ORDER — PANTOPRAZOLE SODIUM 40 MG PO TBEC: 40.0000 mg | DELAYED_RELEASE_TABLET | Freq: Two times a day (BID) | ORAL | 3 refills | Status: DC

## 2018-07-10 DIAGNOSIS — Z1231 Encounter for screening mammogram for malignant neoplasm of breast: Secondary | ICD-10-CM | POA: Diagnosis not present

## 2018-07-18 ENCOUNTER — Encounter: Payer: Self-pay | Admitting: Nurse Practitioner

## 2018-07-18 ENCOUNTER — Ambulatory Visit (INDEPENDENT_AMBULATORY_CARE_PROVIDER_SITE_OTHER): Payer: Medicare Other | Admitting: Nurse Practitioner

## 2018-07-18 VITALS — BP 123/47 | HR 56 | Resp 16 | Ht 62.5 in | Wt 110.4 lb

## 2018-07-18 DIAGNOSIS — D473 Essential (hemorrhagic) thrombocythemia: Secondary | ICD-10-CM

## 2018-07-18 DIAGNOSIS — K2100 Gastro-esophageal reflux disease with esophagitis, without bleeding: Secondary | ICD-10-CM

## 2018-07-18 DIAGNOSIS — J449 Chronic obstructive pulmonary disease, unspecified: Secondary | ICD-10-CM

## 2018-07-18 DIAGNOSIS — I10 Essential (primary) hypertension: Secondary | ICD-10-CM | POA: Diagnosis not present

## 2018-07-18 DIAGNOSIS — E782 Mixed hyperlipidemia: Secondary | ICD-10-CM

## 2018-07-18 DIAGNOSIS — D75839 Thrombocytosis, unspecified: Secondary | ICD-10-CM

## 2018-07-18 MED ORDER — PROAIR HFA 108 (90 BASE) MCG/ACT IN AERS: 1.0000 | INHALATION_SPRAY | Freq: Four times a day (QID) | RESPIRATORY_TRACT | 5 refills | Status: DC | PRN

## 2018-07-18 MED ORDER — BUDESONIDE-FORMOTEROL FUMARATE 80-4.5 MCG/ACT IN AERO: 1.0000 | INHALATION_SPRAY | Freq: Two times a day (BID) | RESPIRATORY_TRACT | 5 refills | Status: DC

## 2018-07-18 NOTE — Progress Notes (Signed)
[facility] [place], [place] [address]  Internal MEDICINE  Office Visit Note  Patient Name: Jaquelynn Wanamaker  [id number]  [id number]  Date of Service: 07/27/2018  Chief Complaint  Patient presents with  . Medical Management of Chronic Issues    4 month follow up  . Hypertension  . Hyperlipidemia    Hypertension  This is a chronic problem. The problem is unchanged. The problem is controlled. Associated symptoms include headaches and shortness of breath. Pertinent negatives include no chest pain or palpitations. There are no associated agents to hypertension. Risk factors for coronary artery disease include dyslipidemia and post-menopausal state. Past treatments include calcium channel blockers. The current treatment provides moderate improvement. There are no compliance problems.  Hypertensive end-organ damage includes CVA.       Current Medication: Outpatient Encounter Medications as of 07/18/2018  Medication Sig  . amLODipine (NORVASC) 10 MG tablet TAKE ONE TABLET BY MOUTH EVERY NIGHT FORHYPERTENSION  . aspirin EC 81 MG tablet Take by mouth.  Marland Kitchen atorvastatin (LIPITOR) 40 MG tablet TAKE 1 TABLET BY MOUTH AT BEDTIME  . budesonide-formoterol (SYMBICORT) 80-4.5 MCG/ACT inhaler Inhale 1 puff into the lungs 2 (two) times daily.  . clopidogrel (PLAVIX) 75 MG tablet TAKE 1 TABLET BY MOUTH EVERY DAY  . dexlansoprazole (DEXILANT) 60 MG capsule Take 1 capsule (60 mg total) by mouth daily.  . metoprolol tartrate (LOPRESSOR) 50 MG tablet TAKE 1 TABLET BY MOUTH EVERY 12 HOURS  . nitroGLYCERIN (NITROSTAT) 0.4 MG SL tablet Place under the tongue.  . ondansetron (ZOFRAN ODT) 4 MG disintegrating tablet Take 1 tablet (4 mg total) by mouth every 8 (eight) hours as needed for nausea or vomiting.  . pantoprazole (PROTONIX) 40 MG tablet Take 1 tablet (40 mg total) by mouth 2 (two) times daily.  Marland Kitchen PROAIR HFA 108 (90 Base) MCG/ACT inhaler Inhale 1-2 puffs into the lungs  every 6 (six) hours as needed for wheezing.  . [DISCONTINUED] budesonide-formoterol (SYMBICORT) 80-4.5 MCG/ACT inhaler Inhale 1 puff into the lungs 2 (two) times daily.  . [DISCONTINUED] PROAIR HFA 108 (90 Base) MCG/ACT inhaler Inhale 1-2 puffs into the lungs every 6 (six) hours as needed for wheezing.  . ergocalciferol (DRISDOL) 50000 units capsule Take 1 capsule (50,000 Units total) by mouth once a week. (Patient not taking: Reported on 03/24/2018)  . [DISCONTINUED] benzonatate (TESSALON) 100 MG capsule Take 1 capsule (100 mg total) by mouth 2 (two) times daily as needed for cough. (Patient not taking: Reported on 04/28/2018)   No facility-administered encounter medications on file as of 07/18/2018.     Surgical History: Past Surgical History:  Procedure Laterality Date  . APPENDECTOMY  2004  . BYPASS GRAFT  2014  . CATARACT EXTRACTION  [id number]  . COLONOSCOPY  2004    Medical History: Past Medical History:  Diagnosis Date  . Anemia   . Gastro-esophageal reflux   . Hyperlipidemia   . Hypertension     Family History: Family History  Problem Relation Age of Onset  . Hypertension Father   . Coronary artery disease Sister   . Hypertension Sister   . Diabetes Brother   . Hypertension Brother     Social History   Socioeconomic History  . Marital status: Single    Spouse name: Not on file  . Number of children: Not on file  . Years of education: Not on file  . Highest education level: Not on file  Occupational History  . Not on file  Social Needs  . Financial resource strain: Not on file  . Food insecurity:    Worry: Not on file    Inability: Not on file  . Transportation needs:    Medical: Not on file    Non-medical: Not on file  Tobacco Use  . Smoking status: Former Smoker    Packs/day: 1.00    Years: 40.00    Pack years: 40.00    Types: Cigarettes    Last attempt to quit: 03/24/2013    Years since quitting: 5.3  . Smokeless tobacco: Never Used  Substance  and Sexual Activity  . Alcohol use: No  . Drug use: No  . Sexual activity: Not on file  Lifestyle  . Physical activity:    Days per week: Not on file    Minutes per session: Not on file  . Stress: Not on file  Relationships  . Social connections:    Talks on phone: Not on file    Gets together: Not on file    Attends religious service: Not on file    Active member of club or organization: Not on file    Attends meetings of clubs or organizations: Not on file    Relationship status: Not on file  . Intimate partner violence:    Fear of current or ex partner: Not on file    Emotionally abused: Not on file    Physically abused: Not on file    Forced sexual activity: Not on file  Other Topics Concern  . Not on file  Social History Narrative  . Not on file      Review of Systems  Constitutional: Negative for activity change, chills, fatigue and fever.  HENT: Negative for congestion, postnasal drip, rhinorrhea, sneezing, sore throat and voice change.   Respiratory: Positive for cough, shortness of breath and wheezing.        Intermittent and chronic  Cardiovascular: Negative for chest pain and palpitations.  Gastrointestinal: Negative for constipation, diarrhea, nausea and vomiting.  Endocrine: Negative for cold intolerance, heat intolerance, polydipsia, polyphagia and polyuria.  Musculoskeletal: Negative for arthralgias, back pain and myalgias.  Skin: Negative for rash.  Allergic/Immunologic: Positive for environmental allergies.  Neurological: Positive for headaches. Negative for dizziness.  Hematological: Negative for adenopathy.  Psychiatric/Behavioral: Negative for dysphoric mood. The patient is not nervous/anxious.     Today's Vitals   07/18/18 1055  BP: (!) 123/47  Pulse: (!) 56  Resp: 16  SpO2: 94%  Weight: 110 lb 6.4 oz (50.1 kg)  Height: 5' 2.5" (1.588 m)    Physical Exam Vitals signs and nursing note reviewed.  Constitutional:      Appearance: Normal  appearance. She is well-developed.  HENT:     Head: Normocephalic.     Nose: Nose normal.  Eyes:     Conjunctiva/sclera: Conjunctivae normal.     Pupils: Pupils are equal, round, and reactive to light.  Neck:     Musculoskeletal: Normal range of motion and neck supple.     Thyroid: No thyromegaly.     Vascular: No carotid bruit or JVD.     Trachea: No tracheal deviation.  Cardiovascular:     Rate and Rhythm: Normal rate and regular rhythm.     Heart sounds: Normal heart sounds.  Pulmonary:     Effort: Pulmonary effort is normal. No respiratory distress.     Breath sounds: Normal breath sounds. No wheezing.  Abdominal:     General: Bowel sounds are normal.  Palpations: Abdomen is soft.     Tenderness: There is no abdominal tenderness.  Musculoskeletal: Normal range of motion.  Lymphadenopathy:     Cervical: No cervical adenopathy.  Skin:    General: Skin is warm and dry.     Capillary Refill: Capillary refill takes 2 to 3 seconds.  Neurological:     Mental Status: She is alert and oriented to person, place, and time.     Cranial Nerves: No cranial nerve deficit.     Deep Tendon Reflexes: Reflexes normal.  Psychiatric:        Behavior: Behavior normal.        Thought Content: Thought content normal.        Judgment: Judgment normal.    Assessment/Plan: 1. Essential hypertension Stable. Continue bp medication as prescribed   2. Chronic obstructive pulmonary disease, unspecified COPD type ([place]) Well managed. Continue symbicort twice daily. Use rescue inhaler as needed and as prescribed. Refills provided for both tuday.  - budesonide-formoterol (SYMBICORT) 80-4.5 MCG/ACT inhaler; Inhale 1 puff into the lungs 2 (two) times daily.  Dispense: 1 Inhaler; Refill: 5 - PROAIR HFA 108 (90 Base) MCG/ACT inhaler; Inhale 1-2 puffs into the lungs every 6 (six) hours as needed for wheezing.  Dispense: 1 Inhaler; Refill: 5  3. Thrombocytosis ([place]) Patient followed per  hematology.  4. Gastro-esophageal reflux disease with esophagitis Improved. Continue dexilant as prescribed   5. Mixed hyperlipidemia Stable lipid panel. Continue atorvastatin as prescribed.   General Counseling: margarete horace understanding of the findings of todays visit and agrees with plan of treatment. I have discussed any further diagnostic evaluation that may be needed or ordered today. We also reviewed her medications today. she has been encouraged to call the office with any questions or concerns that should arise related to todays visit.  Hypertension Counseling:   The following hypertensive lifestyle modification were recommended and discussed:  1. Limiting alcohol intake to less than 1 oz/day of ethanol:(24 oz of beer or 8 oz of wine or 2 oz of 100-proof whiskey). 2. Take baby ASA 81 mg daily. 3. Importance of regular aerobic exercise and losing weight. 4. Reduce dietary saturated fat and cholesterol intake for overall cardiovascular health. 5. Maintaining adequate dietary potassium, calcium, and magnesium intake. 6. Regular monitoring of the blood pressure. 7. Reduce sodium intake to less than 100 mmol/day (less than 2.3 gm of sodium or less than 6 gm of sodium choride)   This patient was seen by [place] with Dr Lavera Guise as a part of collaborative care agreement  Meds ordered this encounter  Medications  . budesonide-formoterol (SYMBICORT) 80-4.5 MCG/ACT inhaler    Sig: Inhale 1 puff into the lungs 2 (two) times daily.    Dispense:  1 Inhaler    Refill:  5    Order Specific Question:   Supervising Provider    Answer:   Lavera Guise [[id number]]  . PROAIR HFA 108 (90 Base) MCG/ACT inhaler    Sig: Inhale 1-2 puffs into the lungs every 6 (six) hours as needed for wheezing.    Dispense:  1 Inhaler    Refill:  5    Order Specific Question:   Supervising Provider    Answer:   Lavera Guise [[id number]]    Time spent: 43 Minutes      Dr Lavera Guise Internal medicine

## 2018-08-13 ENCOUNTER — Other Ambulatory Visit: Payer: Self-pay

## 2018-08-13 ENCOUNTER — Ambulatory Visit: Payer: Self-pay | Admitting: Adult Health

## 2018-08-13 ENCOUNTER — Inpatient Hospital Stay
Admission: EM | Admit: 2018-08-13 | Discharge: 2018-08-15 | DRG: 193 | Disposition: A | Discharge status: Home / Self Care | Payer: Medicare Other | Attending: Internal Medicine | Admitting: Internal Medicine

## 2018-08-13 ENCOUNTER — Encounter: Payer: Self-pay | Admitting: *Deleted

## 2018-08-13 ENCOUNTER — Emergency Department: Payer: Medicare Other

## 2018-08-13 DIAGNOSIS — A419 Sepsis, unspecified organism: Secondary | ICD-10-CM | POA: Diagnosis not present

## 2018-08-13 DIAGNOSIS — K5730 Diverticulosis of large intestine without perforation or abscess without bleeding: Secondary | ICD-10-CM | POA: Diagnosis not present

## 2018-08-13 DIAGNOSIS — K219 Gastro-esophageal reflux disease without esophagitis: Secondary | ICD-10-CM | POA: Diagnosis present

## 2018-08-13 DIAGNOSIS — E872 Acidosis: Secondary | ICD-10-CM | POA: Diagnosis present

## 2018-08-13 DIAGNOSIS — J440 Chronic obstructive pulmonary disease with acute lower respiratory infection: Secondary | ICD-10-CM | POA: Diagnosis not present

## 2018-08-13 DIAGNOSIS — J181 Lobar pneumonia, unspecified organism: Secondary | ICD-10-CM

## 2018-08-13 DIAGNOSIS — Z833 Family history of diabetes mellitus: Secondary | ICD-10-CM | POA: Diagnosis not present

## 2018-08-13 DIAGNOSIS — Z8249 Family history of ischemic heart disease and other diseases of the circulatory system: Secondary | ICD-10-CM | POA: Diagnosis not present

## 2018-08-13 DIAGNOSIS — J189 Pneumonia, unspecified organism: Secondary | ICD-10-CM | POA: Diagnosis present

## 2018-08-13 DIAGNOSIS — J069 Acute upper respiratory infection, unspecified: Secondary | ICD-10-CM | POA: Diagnosis not present

## 2018-08-13 DIAGNOSIS — J9601 Acute respiratory failure with hypoxia: Secondary | ICD-10-CM | POA: Diagnosis present

## 2018-08-13 DIAGNOSIS — K5792 Diverticulitis of intestine, part unspecified, without perforation or abscess without bleeding: Secondary | ICD-10-CM

## 2018-08-13 DIAGNOSIS — J168 Pneumonia due to other specified infectious organisms: Secondary | ICD-10-CM | POA: Diagnosis not present

## 2018-08-13 DIAGNOSIS — R0902 Hypoxemia: Secondary | ICD-10-CM | POA: Diagnosis not present

## 2018-08-13 DIAGNOSIS — I10 Essential (primary) hypertension: Secondary | ICD-10-CM | POA: Diagnosis not present

## 2018-08-13 DIAGNOSIS — J101 Influenza due to other identified influenza virus with other respiratory manifestations: Secondary | ICD-10-CM

## 2018-08-13 DIAGNOSIS — E785 Hyperlipidemia, unspecified: Secondary | ICD-10-CM | POA: Diagnosis present

## 2018-08-13 DIAGNOSIS — J1000 Influenza due to other identified influenza virus with unspecified type of pneumonia: Secondary | ICD-10-CM | POA: Diagnosis not present

## 2018-08-13 DIAGNOSIS — K5732 Diverticulitis of large intestine without perforation or abscess without bleeding: Secondary | ICD-10-CM | POA: Diagnosis not present

## 2018-08-13 DIAGNOSIS — Z87891 Personal history of nicotine dependence: Secondary | ICD-10-CM

## 2018-08-13 LAB — URINALYSIS, COMPLETE (UACMP) WITH MICROSCOPIC
Bacteria, UA: NONE SEEN
Bilirubin Urine: NEGATIVE
Glucose, UA: NEGATIVE mg/dL
Ketones, ur: NEGATIVE mg/dL
Leukocytes,Ua: NEGATIVE
Nitrite: NEGATIVE
PH: 5 (ref 5.0–8.0)
Protein, ur: NEGATIVE mg/dL
Specific Gravity, Urine: >1.046 — ABNORMAL HIGH (ref 1.005–1.030)

## 2018-08-13 LAB — CBC
HCT: 42.7 % (ref 36.0–46.0)
Hemoglobin: 13.1 g/dL (ref 12.0–15.0)
MCH: 25.4 pg — ABNORMAL LOW (ref 26.0–34.0)
MCHC: 30.7 g/dL (ref 30.0–36.0)
MCV: 82.8 fL (ref 80.0–100.0)
Platelets: 301 10*3/uL (ref 150–400)
RBC: 5.16 MIL/uL — ABNORMAL HIGH (ref 3.87–5.11)
RDW: 19 % — ABNORMAL HIGH (ref 11.5–15.5)
WBC: 11.1 10*3/uL — ABNORMAL HIGH (ref 4.0–10.5)
nRBC: 0 % (ref 0.0–0.2)

## 2018-08-13 LAB — BLOOD GAS, VENOUS
Patient temperature: 37
pCO2, Ven: 23 mmHg — ABNORMAL LOW (ref 44.0–60.0)
pH, Ven: 7.34 (ref 7.250–7.430)
pO2, Ven: 55 mmHg — ABNORMAL HIGH (ref 32.0–45.0)

## 2018-08-13 LAB — COMPREHENSIVE METABOLIC PANEL
ALT: 15 U/L (ref 0–44)
ANION GAP: 10 (ref 5–15)
AST: 38 U/L (ref 15–41)
Albumin: 3.7 g/dL (ref 3.5–5.0)
Alkaline Phosphatase: 93 U/L (ref 38–126)
BILIRUBIN TOTAL: 0.7 mg/dL (ref 0.3–1.2)
BUN: 19 mg/dL (ref 8–23)
CO2: 17 mmol/L — ABNORMAL LOW (ref 22–32)
Calcium: 8.6 mg/dL — ABNORMAL LOW (ref 8.9–10.3)
Chloride: 109 mmol/L (ref 98–111)
Creatinine, Ser: 1.51 mg/dL — ABNORMAL HIGH (ref 0.44–1.00)
GFR calc Af Amer: 37 mL/min — ABNORMAL LOW (ref >60)
GFR calc non Af Amer: 32 mL/min — ABNORMAL LOW (ref >60)
Glucose, Bld: 141 mg/dL — ABNORMAL HIGH (ref 70–99)
Potassium: 4.5 mmol/L (ref 3.5–5.1)
Sodium: 136 mmol/L (ref 135–145)
TOTAL PROTEIN: 7.6 g/dL (ref 6.5–8.1)

## 2018-08-13 LAB — LACTIC ACID, PLASMA
Lactic Acid, Venous: 3.6 mmol/L (ref 0.5–1.9)
Lactic Acid, Venous: 2.6 mmol/L (ref 0.5–1.9)

## 2018-08-13 LAB — INFLUENZA PANEL BY PCR (TYPE A & B)
INFLBPCR: NEGATIVE
Influenza A By PCR: POSITIVE — AB

## 2018-08-13 LAB — TROPONIN I: Troponin I: <0.03 ng/mL (ref <0.03)

## 2018-08-13 LAB — LIPASE, BLOOD: Lipase: 50 U/L (ref 11–51)

## 2018-08-13 MED ORDER — SODIUM CHLORIDE 0.9 % IV SOLN
500.0000 mg | Freq: Once | INTRAVENOUS | Status: AC
  Administered 2018-08-13: 500 mg via INTRAVENOUS
  Filled 2018-08-13: qty 500

## 2018-08-13 MED ORDER — ACETAMINOPHEN 650 MG RE SUPP: 650.0000 mg | Freq: Four times a day (QID) | RECTAL | Status: DC | PRN

## 2018-08-13 MED ORDER — MOMETASONE FURO-FORMOTEROL FUM 100-5 MCG/ACT IN AERO
2.0000 | INHALATION_SPRAY | Freq: Two times a day (BID) | RESPIRATORY_TRACT | Status: DC
  Administered 2018-08-13 – 2018-08-15 (×4): 2 via RESPIRATORY_TRACT
  Filled 2018-08-13: qty 8.8

## 2018-08-13 MED ORDER — POLYETHYLENE GLYCOL 3350 17 G PO PACK: 17.0000 g | PACK | Freq: Every day | ORAL | Status: DC | PRN

## 2018-08-13 MED ORDER — SODIUM CHLORIDE 0.9 % IV SOLN
1.0000 g | Freq: Once | INTRAVENOUS | Status: AC
  Administered 2018-08-13: 1 g via INTRAVENOUS
  Filled 2018-08-13: qty 10

## 2018-08-13 MED ORDER — CIPROFLOXACIN HCL 500 MG PO TABS
500.0000 mg | ORAL_TABLET | Freq: Once | ORAL | Status: DC
  Filled 2018-08-13: qty 1

## 2018-08-13 MED ORDER — ONDANSETRON HCL 4 MG/2ML IJ SOLN
4.0000 mg | Freq: Once | INTRAMUSCULAR | Status: AC
  Administered 2018-08-13: 4 mg via INTRAVENOUS
  Filled 2018-08-13: qty 2

## 2018-08-13 MED ORDER — SODIUM CHLORIDE 0.9 % IV SOLN: Freq: Once | INTRAVENOUS | Status: DC

## 2018-08-13 MED ORDER — AMLODIPINE BESYLATE 10 MG PO TABS
10.0000 mg | ORAL_TABLET | Freq: Every day | ORAL | Status: DC
  Administered 2018-08-13 – 2018-08-15 (×2): 10 mg via ORAL
  Filled 2018-08-13 (×2): qty 1

## 2018-08-13 MED ORDER — ONDANSETRON HCL 4 MG PO TABS: 4.0000 mg | ORAL_TABLET | Freq: Four times a day (QID) | ORAL | Status: DC | PRN

## 2018-08-13 MED ORDER — ONDANSETRON HCL 4 MG/2ML IJ SOLN: 4.0000 mg | Freq: Four times a day (QID) | INTRAMUSCULAR | Status: DC | PRN

## 2018-08-13 MED ORDER — ATORVASTATIN CALCIUM 20 MG PO TABS
40.0000 mg | ORAL_TABLET | Freq: Every day | ORAL | Status: DC
  Administered 2018-08-13 – 2018-08-14 (×2): 40 mg via ORAL
  Filled 2018-08-13 (×2): qty 2

## 2018-08-13 MED ORDER — METOPROLOL TARTRATE 50 MG PO TABS
50.0000 mg | ORAL_TABLET | Freq: Two times a day (BID) | ORAL | Status: DC
  Administered 2018-08-13 – 2018-08-15 (×3): 50 mg via ORAL
  Filled 2018-08-13 (×3): qty 1

## 2018-08-13 MED ORDER — OSELTAMIVIR PHOSPHATE 75 MG PO CAPS: 75.0000 mg | ORAL_CAPSULE | Freq: Two times a day (BID) | ORAL | Status: DC

## 2018-08-13 MED ORDER — FENTANYL CITRATE (PF) 100 MCG/2ML IJ SOLN
50.0000 ug | Freq: Once | INTRAMUSCULAR | Status: AC
  Administered 2018-08-13: 50 ug via INTRAVENOUS
  Filled 2018-08-13: qty 2

## 2018-08-13 MED ORDER — CLOPIDOGREL BISULFATE 75 MG PO TABS
75.0000 mg | ORAL_TABLET | Freq: Every day | ORAL | Status: DC
  Administered 2018-08-13 – 2018-08-15 (×3): 75 mg via ORAL
  Filled 2018-08-13 (×3): qty 1

## 2018-08-13 MED ORDER — METRONIDAZOLE 500 MG PO TABS
500.0000 mg | ORAL_TABLET | Freq: Once | ORAL | Status: DC
  Filled 2018-08-13: qty 1

## 2018-08-13 MED ORDER — OSELTAMIVIR PHOSPHATE 75 MG PO CAPS
75.0000 mg | ORAL_CAPSULE | Freq: Once | ORAL | Status: AC
  Administered 2018-08-13: 75 mg via ORAL
  Filled 2018-08-13: qty 1

## 2018-08-13 MED ORDER — PANTOPRAZOLE SODIUM 40 MG PO TBEC
40.0000 mg | DELAYED_RELEASE_TABLET | Freq: Two times a day (BID) | ORAL | Status: DC
  Administered 2018-08-13 – 2018-08-15 (×4): 40 mg via ORAL
  Filled 2018-08-13 (×4): qty 1

## 2018-08-13 MED ORDER — SODIUM CHLORIDE 0.9% FLUSH: 3.0000 mL | Freq: Once | INTRAVENOUS | Status: DC

## 2018-08-13 MED ORDER — SODIUM CHLORIDE 0.9 % IV SOLN
3.0000 g | Freq: Two times a day (BID) | INTRAVENOUS | Status: DC
  Administered 2018-08-13 – 2018-08-14 (×3): 3 g via INTRAVENOUS
  Filled 2018-08-13 (×7): qty 3

## 2018-08-13 MED ORDER — ENOXAPARIN SODIUM 30 MG/0.3ML ~~LOC~~ SOLN
30.0000 mg | SUBCUTANEOUS | Status: DC
  Administered 2018-08-13 – 2018-08-14 (×2): 30 mg via SUBCUTANEOUS
  Filled 2018-08-13 (×2): qty 0.3

## 2018-08-13 MED ORDER — FAMOTIDINE IN NACL 20-0.9 MG/50ML-% IV SOLN
20.0000 mg | Freq: Once | INTRAVENOUS | Status: AC
  Administered 2018-08-13: 20 mg via INTRAVENOUS
  Filled 2018-08-13: qty 50

## 2018-08-13 MED ORDER — ONDANSETRON 4 MG PO TBDP: 4.0000 mg | ORAL_TABLET | Freq: Three times a day (TID) | ORAL | Status: DC | PRN

## 2018-08-13 MED ORDER — KETOROLAC TROMETHAMINE 30 MG/ML IJ SOLN: 30.0000 mg | Freq: Once | INTRAMUSCULAR | Status: DC

## 2018-08-13 MED ORDER — ACETAMINOPHEN 325 MG PO TABS: 650.0000 mg | ORAL_TABLET | Freq: Four times a day (QID) | ORAL | Status: DC | PRN

## 2018-08-13 MED ORDER — METHYLPREDNISOLONE SODIUM SUCC 125 MG IJ SOLR
125.0000 mg | Freq: Once | INTRAMUSCULAR | Status: AC
  Administered 2018-08-13: 125 mg via INTRAVENOUS
  Filled 2018-08-13: qty 2

## 2018-08-13 MED ORDER — ALBUTEROL SULFATE (2.5 MG/3ML) 0.083% IN NEBU: 2.5000 mg | INHALATION_SOLUTION | RESPIRATORY_TRACT | Status: DC | PRN

## 2018-08-13 MED ORDER — SODIUM CHLORIDE 0.9 % IV SOLN: 3.0000 g | Freq: Four times a day (QID) | INTRAVENOUS | Status: DC

## 2018-08-13 MED ORDER — OSELTAMIVIR PHOSPHATE 30 MG PO CAPS
30.0000 mg | ORAL_CAPSULE | Freq: Every day | ORAL | Status: DC
  Administered 2018-08-14: 30 mg via ORAL
  Filled 2018-08-13 (×2): qty 1

## 2018-08-13 MED ORDER — IOHEXOL 300 MG/ML  SOLN
75.0000 mL | Freq: Once | INTRAMUSCULAR | Status: AC | PRN
  Administered 2018-08-13: 75 mL via INTRAVENOUS

## 2018-08-13 MED ORDER — SODIUM CHLORIDE 0.9 % IV BOLUS
1000.0000 mL | Freq: Once | INTRAVENOUS | Status: AC
  Administered 2018-08-13: 1000 mL via INTRAVENOUS

## 2018-08-13 NOTE — ED Provider Notes (Signed)
[facility] Emergency Department Provider Note  ____________________________________________  Time seen: Approximately 4:29 PM  I have reviewed the triage vital signs and the nursing notes.   HISTORY  Chief Complaint Illness and Abdominal Pain    HPI Olivia Lane is a 83 y.o. female with a history of HTN, HL, and COPD not on supplemental oxygen presenting with cough and cold symptoms, shortness of breath, nausea vomiting and epigastric pain.  The patient reports that 8 days ago, she began to have a cough productive of sputum, with congestion and rhinorrhea.  She has had a progressively worsening dyspnea on exertion.  She did not have any associated sore throat except when she coughed, or ear pain.  No fevers or chills.  Since yesterday, the patient has been having nausea and vomiting with epigastric pain.  +diarrhea  No urinary symptoms.   Past Medical History:  Diagnosis Date  . Anemia   . Gastro-esophageal reflux   . Hyperlipidemia   . Hypertension     Patient Active Problem List   Diagnosis Date Noted  . Screening for breast cancer 03/18/2018  . Thrombocytosis ([place]) 12/08/2017  . Chronic obstructive pulmonary disease, unspecified ([place]) 07/11/2017  . Gastro-esophageal reflux disease with esophagitis 07/11/2017  . Mixed hyperlipidemia 07/11/2017  . Coronary artery disease 08/27/2016  . Nausea 12/02/2013  . Postoperative atrial fibrillation ([place]) 06/07/2013  . S/P CABG x 4 06/03/2013  . Abnormal stress electrocardiogram test 05/19/2013  . Anemia 05/19/2013  . Former smoker 05/19/2013  . Essential hypertension 05/19/2013  . PAD (peripheral artery disease) ([place]) 05/19/2013  . S/P appendectomy 05/19/2013    Past Surgical History:  Procedure Laterality Date  . APPENDECTOMY  2004  . BYPASS GRAFT  2014  . CATARACT EXTRACTION  [id number]  . COLONOSCOPY  2004    Current Outpatient Rx  . Order #: [id number] Class: Normal  . Order #:  [id number] Class: Normal  . Order #: [id number] Class: Normal  . Order #: [id number] Class: Normal  . Order #: [id number] Class: Normal  . Order #: [id number] Class: Normal  . Order #: [id number] Class: Normal    Allergies Patient has no known allergies.  Family History  Problem Relation Age of Onset  . Hypertension Father   . Coronary artery disease Sister   . Hypertension Sister   . Diabetes Brother   . Hypertension Brother     Social History Social History   Tobacco Use  . Smoking status: Former Smoker    Packs/day: 1.00    Years: 40.00    Pack years: 40.00    Types: Cigarettes    Last attempt to quit: 03/24/2013    Years since quitting: 5.3  . Smokeless tobacco: Never Used  Substance Use Topics  . Alcohol use: No  . Drug use: No    Review of Systems Constitutional: No fever/chills.  Positive general malaise.  No lightheadedness or syncope. Eyes: No visual changes.  No eye discharge. ENT: Sore throat only with cough..  Positive congestion and rhinorrhea. Cardiovascular: Denies chest pain. Denies palpitations. Respiratory: Positive exertional shortness of breath.  Positive productive cough. Gastrointestinal: Positive epigastric abdominal pain.  +nausea, +vomiting.  + diarrhea.  No constipation. Genitourinary: Negative for dysuria.  No urinary frequency. Musculoskeletal: Negative for back pain. Skin: Negative for rash. Neurological: Negative for headaches. No focal numbness, tingling or weakness.     ____________________________________________   PHYSICAL EXAM:  VITAL SIGNS: ED Triage Vitals  Enc Vitals Group     BP 08/13/18 1529 (!) 128/45  Pulse Rate 08/13/18 1529 (!) 116     Resp 08/13/18 1529 (!) 22     Temp 08/13/18 1529 99.5 F (37.5 C)     Temp src --      SpO2 08/13/18 1529 (!) 88 %     Weight 08/13/18 1530 115 lb (52.2 kg)     Height --      Head Circumference --      Peak Flow --      Pain Score 08/13/18 1530 10     Pain Loc --      Pain Edu?  --      Excl. in [place]? --     Constitutional: Alert and oriented. Answers questions appropriately.  Chronically ill-appearing and uncomfortable. Eyes: Conjunctivae are normal.  EOMI. No scleral icterus.  No eye discharge. Head: Atraumatic. Nose: No congestion/rhinnorhea. Mouth/Throat: Mucous membranes are very dry.  Neck: No stridor.  Supple.  No JVD.  No meningismus. Cardiovascular: Fast rate, regular rhythm. No murmurs, rubs or gallops.  Respiratory: The patient has a mild tachypnea without accessory muscle use or retractions.  She is on 3 L supplemental oxygen and satting 95% on my exam.  She has a prolonged expiratory phase without wheezes rales or rhonchi.. Gastrointestinal: Soft, and nondistended.  Tender to palpation in the epigastric greater than the right upper quadrant areas.  No Murphy sign.  No guarding or rebound.  No peritoneal signs. Musculoskeletal: No LE edema. No ttp in the calves or palpable cords.  Negative Homan's sign. Neurologic:  A&Ox3.  Speech is clear.  Face and smile are symmetric.  EOMI.  Moves all extremities well. Skin:  Skin is warm, dry and intact. No rash noted. Psychiatric: Mood and affect are normal. Speech and behavior are normal.  Normal judgement  ____________________________________________   LABS (all labs ordered are listed, but only abnormal results are displayed)  Labs Reviewed  COMPREHENSIVE METABOLIC PANEL - Abnormal; Notable for the following components:      Result Value   CO2 17 (*)    Glucose, Bld 141 (*)    Creatinine, Ser 1.51 (*)    Calcium 8.6 (*)    GFR calc non Af Amer 32 (*)    GFR calc Af Amer 37 (*)    All other components within normal limits  CBC - Abnormal; Notable for the following components:   WBC 11.1 (*)    RBC 5.16 (*)    MCH 25.4 (*)    RDW 19.0 (*)    All other components within normal limits  URINALYSIS, COMPLETE (UACMP) WITH MICROSCOPIC - Abnormal; Notable for the following components:   Color, Urine YELLOW  (*)    APPearance CLEAR (*)    Specific Gravity, Urine >1.046 (*)    Hgb urine dipstick SMALL (*)    All other components within normal limits  LACTIC ACID, PLASMA - Abnormal; Notable for the following components:   Lactic Acid, Venous 3.6 (*)    All other components within normal limits  BLOOD GAS, VENOUS - Abnormal; Notable for the following components:   pCO2, Ven 23 (*)    pO2, Ven 55.0 (*)    All other components within normal limits  CULTURE, BLOOD (ROUTINE X 2)  CULTURE, BLOOD (ROUTINE X 2)  URINE CULTURE  LIPASE, BLOOD  TROPONIN I  INFLUENZA PANEL BY PCR (TYPE A & B)  LACTIC ACID, PLASMA   ____________________________________________  EKG  ED ECG REPORT I, Anne-Caroline Mariea Clonts, the attending physician, personally viewed and  interpreted this ECG.   Date: 08/13/2018  EKG Time: 1727  Rate: 107  Rhythm: sinus tachycardia  Axis: normal  Intervals:none  ST&T Change: No STEMI; 1 mm ST depression in V3 and V4 with 0.5 mm ST elevation in aVR.  EKG is compared to 2011, where she does not have these discrete changes, but the morphology is similar.  ____________________________________________  RADIOLOGY  Dg Chest 2 View  Result Date: 08/13/2018 CLINICAL DATA:  Upper respiratory infection, dyspnea, hypoxia EXAM: CHEST - 2 VIEW COMPARISON:  08/29/2009 chest radiograph. FINDINGS: Intact sternotomy wires. Retained epicardial pacer leads overlie the heart. Stable cardiomediastinal silhouette with normal heart size. No pneumothorax. No pleural effusion. Mildly hyperinflated lungs. No pulmonary edema. No acute consolidative airspace disease. Minimal right lung base scarring. IMPRESSION: 1. No acute cardiopulmonary disease. 2. Mildly hyperinflated lungs, suggesting COPD. 3. Minimal right lung base scarring. Electronically Signed   By: Ilona Sorrel M.D.   On: 08/13/2018 16:22   Ct Abdomen Pelvis W Contrast  Result Date: 08/13/2018 CLINICAL DATA:  Flu like illness since last week,  congestion, body aches, generalized malaise, nausea, vomiting, diarrhea, history GERD and hypertension EXAM: CT ABDOMEN AND PELVIS WITH CONTRAST TECHNIQUE: Multidetector CT imaging of the abdomen and pelvis was performed using the standard protocol following bolus administration of intravenous contrast. Sagittal and coronal MPR images reconstructed from axial data set. CONTRAST:  81mL OMNIPAQUE IOHEXOL 300 MG/ML SOLN IV. No oral contrast. COMPARISON:  None FINDINGS: Lower chest: Peribronchial thickening. Infiltrate identified at posterior LEFT lower lobe base. Hepatobiliary: Gallbladder and liver normal appearance. Pancreas: Normal appearance Spleen: Normal appearance Adrenals/Urinary Tract: Adrenal glands normal appearance. BILATERAL renal cortical atrophy and cortical scarring. Small BILATERAL renal cysts. Small nonobstructing BILATERAL renal calculi. No hydronephrosis, hydroureter or ureteral calcification. Bladder decompressed. Stomach/Bowel: Normal appendix. Sigmoid diverticulosis. Minimal hazy pericolic infiltrative changes at sigmoid colon question subtle diverticulitis. No extraluminal gas or fluid collections. Stomach and bowel loops otherwise normal appearance. Vascular/Lymphatic: Atherosclerotic calcifications of aorta and iliac arteries. Stents identified RIGHT external iliac artery and LEFT superficial femoral artery. Aorta normal caliber. No adenopathy. Reproductive: Uterus and ovaries normal appearance Other: No free air or free fluid.  No hernia. Musculoskeletal: Bones demineralized. IMPRESSION: Sigmoid diverticulosis with subtle pericolic infiltrative change question subtle diverticulitis. BILATERAL renal cortical atrophy, renal cysts and nonobstructing calculi. Bronchitic changes with minimal LEFT lower lobe infiltrate. No other acute intra-abdominal or intrapelvic abnormalities. Electronically Signed   By: Lavonia Dana M.D.   On: 08/13/2018 16:58     ____________________________________________   PROCEDURES  Procedure(s) performed: None  Procedures  Critical Care performed: Yes ____________________________________________   INITIAL IMPRESSION / ASSESSMENT AND PLAN / ED COURSE  Pertinent labs & imaging results that were available during my care of the patient were reviewed by me and considered in my medical decision making (see chart for details).  83 y.o. female with 1 week of cough and cold symptoms, now with epigastric pain and GI symptoms.  Overall, the patient's symptoms are most consistent with an influenza-like illness.  However, I am concerned about her hypoxia given that she does not usually require supplemental oxygen.  Her chest x-ray does not show pneumonia but given that she is dehydrated, there may be a radiographic lag and I will cover her with azithromycin and ceftriaxone for possible bacterial infection.  She does have a history of COPD and Solu-Medrol has been ordered.  An influenza swab is pending and she will be treated with Tamiflu if it is positive.  The  patient's epigastric pain is likely because of her vomiting, but given that she does have a significant amount of pain and it is also in the right upper quadrant, will get a CT for further evaluation.  The patient will require admission to the hospital due to her new oxygen requirement.  ----------------------------------------- 5:56 PM on 08/13/2018 -----------------------------------------  The patient does have a left lower lobe infiltrate consistent with pneumonia and has been treated with azithromycin ceftriaxone.  Her CT abdomen may show early or subtle diverticulitis, and she has been having diarrhea.  Even though this may be due to her respiratory illness, and I am still awaiting his her flu result, I will cover her with oral ciprofloxacin and Flagyl, now that her nausea has been controlled.  The patient will be admitted to the hospital.  She remains in  stable condition and continues to have O2 saturations in the mid 90s on 3 L of supplemental oxygen.  Her blood gas is reassuring.  She has mild acute renal insufficiency and is being treated with intravenous fluids.  CRITICAL CARE Performed by: Eula Listen   Total critical care time: 35 minutes  Critical care time was exclusive of separately billable procedures and treating other patients.  Critical care was necessary to treat or prevent imminent or life-threatening deterioration.  Critical care was time spent personally by me on the following activities: development of treatment plan with patient and/or surrogate as well as nursing, discussions with consultants, evaluation of patient's response to treatment, examination of patient, obtaining history from patient or surrogate, ordering and performing treatments and interventions, ordering and review of laboratory studies, ordering and review of radiographic studies, pulse oximetry and re-evaluation of patient's condition.   ____________________________________________  FINAL CLINICAL IMPRESSION(S) / ED DIAGNOSES  Final diagnoses:  Hypoxia  Pneumonia of left lower lobe due to infectious organism ([place])  Sepsis, due to unspecified organism, unspecified whether acute organ dysfunction present [facility])  Diverticulitis         NEW MEDICATIONS STARTED DURING THIS VISIT:  New Prescriptions   No medications on file      Eula Listen, MD 08/13/18 1801

## 2018-08-13 NOTE — ED Notes (Signed)
While pt did not initially mention abdominal pain she states that she has 10/10 abdominal pain when I asked her about her pain score.  Pt is visibly uncomfortable in triage.

## 2018-08-13 NOTE — Progress Notes (Signed)
Anticoagulation monitoring(Lovenox):  83 yo female ordered Lovenox 40 mg Q24h  Filed Weights   08/13/18 1530  Weight: 115 lb (52.2 kg)   BMI    Lab Results  Component Value Date   CREATININE 1.51 (H) 08/13/2018   CREATININE 1.29 (H) 03/13/2018   CREATININE 1.45 (H) 08/05/2017   Estimated Creatinine Clearance: 23.7 mL/min (A) (by C-G formula based on SCr of 1.51 mg/dL (H)). Hemoglobin & Hematocrit     Component Value Date/Time   HGB 13.1 08/13/2018 1534   HGB 12.3 03/13/2018 0819   HCT 42.7 08/13/2018 1534   HCT 38.2 03/13/2018 0819     Per Protocol for Patient with estCrcl < 30 ml/min and BMI < 40, will transition to Lovenox 30 mg Q24h.

## 2018-08-13 NOTE — Progress Notes (Signed)
Family Meeting Note  Advance Directive:yes patient being admitted with pneumonia, flu, acute hypoxic respiratory failure and subtle diverticulitis. She has generalized weakness cough and congestion. Received IV fluids and IV antibiotics. Code status address patient is a full code. Son present in the ER. Time spent during discussion 16 mins Fritzi Mandes, MD

## 2018-08-13 NOTE — ED Notes (Signed)
ED TO INPATIENT HANDOFF REPORT  Name/Age/Gender Olivia Lane 83 y.o. female  Code Status   Home/SNF/Other Home  Chief Complaint abd pain  Level of Care/Admitting Diagnosis ED Disposition    ED Disposition Condition [place]: [place] [[id number]]  Level of Care: Med-Surg [16]  Diagnosis: Sepsis [facility]) [[id number]]  Admitting Physician: Odessa Fleming  Attending Physician: Odessa Fleming  Estimated length of stay: past midnight tomorrow  Certification:: I certify this patient will need inpatient services for at least 2 midnights  PT Class (Do Not Modify): Inpatient [101]  PT Acc Code (Do Not Modify): Private [1]       Medical History Past Medical History:  Diagnosis Date  . Anemia   . Gastro-esophageal reflux   . Hyperlipidemia   . Hypertension     Allergies No Known Allergies  IV Location/Drains/Wounds Patient Lines/Drains/Airways Status   Active Line/Drains/Airways    Name:   Placement date:   Placement time:   Site:   Days:   Peripheral IV 08/13/18 Right;Posterior Wrist   08/13/18    1533    Wrist   less than 1   Peripheral IV 08/13/18 Left Hand   08/13/18    1727    Hand   less than 1          Labs/Imaging Results for orders placed or performed during the hospital encounter of 08/13/18 (from the past 48 hour(s))  Lipase, blood     Status: None   Collection Time: 08/13/18  3:34 PM  Result Value Ref Range   Lipase 50 11 - 51 U/L    Comment: Performed at [facility], [address] [place], [place] [address]  Comprehensive metabolic panel     Status: Abnormal   Collection Time: 08/13/18  3:34 PM  Result Value Ref Range   Sodium 136 135 - 145 mmol/L   Potassium 4.5 3.5 - 5.1 mmol/L    Comment: HEMOLYSIS AT THIS LEVEL MAY AFFECT RESULT   Chloride 109 98 - 111 mmol/L   CO2 17 (L) 22 - 32 mmol/L   Glucose, Bld 141 (H) 70 - 99 mg/dL   BUN 19 8 - 23 mg/dL   Creatinine, Ser 1.51 (H)  0.44 - 1.00 mg/dL   Calcium 8.6 (L) 8.9 - 10.3 mg/dL   Total Protein 7.6 6.5 - 8.1 g/dL   Albumin 3.7 3.5 - 5.0 g/dL   AST 38 15 - 41 U/L    Comment: HEMOLYSIS AT THIS LEVEL MAY AFFECT RESULT   ALT 15 0 - 44 U/L   Alkaline Phosphatase 93 38 - 126 U/L   Total Bilirubin 0.7 0.3 - 1.2 mg/dL    Comment: HEMOLYSIS AT THIS LEVEL MAY AFFECT RESULT   GFR calc non Af Amer 32 (L) >60 mL/min   GFR calc Af Amer 37 (L) >60 mL/min   Anion gap 10 5 - 15    Comment: Performed at [facility], [address] [place],  [address]  CBC     Status: Abnormal   Collection Time: 08/13/18  3:34 PM  Result Value Ref Range   WBC 11.1 (H) 4.0 - 10.5 K/uL   RBC 5.16 (H) 3.87 - 5.11 MIL/uL   Hemoglobin 13.1 12.0 - 15.0 g/dL   HCT 42.7 36.0 - 46.0 %   MCV 82.8 80.0 - 100.0 fL   MCH 25.4 (L) 26.0 - 34.0 pg   MCHC 30.7 30.0 - 36.0  g/dL   RDW 19.0 (H) 11.5 - 15.5 %   Platelets 301 150 - 400 K/uL   nRBC 0.0 0.0 - 0.2 %    Comment: Performed at [facility], [address] [place], [place] [address]  Troponin I - Add-On to previous collection     Status: None   Collection Time: 08/13/18  3:34 PM  Result Value Ref Range   Troponin I <0.03 <0.03 ng/mL    Comment: Performed at [facility], [address] [place], [place] [address]  Urinalysis, Complete w Microscopic     Status: Abnormal   Collection Time: 08/13/18  5:02 PM  Result Value Ref Range   Color, Urine YELLOW (A) YELLOW   APPearance CLEAR (A) CLEAR   Specific Gravity, Urine >1.046 (H) 1.005 - 1.030   pH 5.0 5.0 - 8.0   Glucose, UA NEGATIVE NEGATIVE mg/dL   Hgb urine dipstick SMALL (A) NEGATIVE   Bilirubin Urine NEGATIVE NEGATIVE   Ketones, ur NEGATIVE NEGATIVE mg/dL   Protein, ur NEGATIVE NEGATIVE mg/dL   Nitrite NEGATIVE NEGATIVE   Leukocytes,Ua NEGATIVE NEGATIVE   RBC / HPF 0-5 0 - 5 RBC/hpf   WBC, UA 0-5 0 - 5 WBC/hpf   Bacteria, UA NONE SEEN NONE SEEN   Squamous Epithelial / LPF 0-5 0 - 5   Mucus  PRESENT    Granular Casts, UA PRESENT     Comment: Performed at [facility], [address] [place], [place] [address]  Influenza panel by PCR (type A & B)     Status: Abnormal   Collection Time: 08/13/18  5:02 PM  Result Value Ref Range   Influenza A By PCR POSITIVE (A) NEGATIVE   Influenza B By PCR NEGATIVE NEGATIVE    Comment: (NOTE) The Xpert Xpress Flu assay is intended as an aid in the diagnosis of  influenza and should not be used as a sole basis for treatment.  This  assay is FDA approved for nasopharyngeal swab specimens only. Nasal  washings and aspirates are unacceptable for Xpert Xpress Flu testing. Performed at [facility], [address] [place], [place] [address]   Lactic acid, plasma     Status: Abnormal   Collection Time: 08/13/18  5:02 PM  Result Value Ref Range   Lactic Acid, Venous 3.6 (HH) 0.5 - 1.9 mmol/L    Comment: CRITICAL RESULT CALLED TO, READ BACK BY AND VERIFIED WITH JESSICA COLTRANE AT 5277 ON 08/13/2018 [place]. Performed at [facility], [address] [place], [place] [address]   Blood gas, venous     Status: Abnormal   Collection Time: 08/13/18  5:02 PM  Result Value Ref Range   pH, Ven 7.34 7.250 - 7.430   pCO2, Ven 23 (L) 44.0 - 60.0 mmHg   pO2, Ven 55.0 (H) 32.0 - 45.0 mmHg   Patient temperature 37.0    Collection site VENOUS    Sample type VENOUS     Comment: Performed at [facility], [address] [place], [place] [address]  Lactic acid, plasma     Status: Abnormal   Collection Time: 08/13/18  6:52 PM  Result Value Ref Range   Lactic Acid, Venous 2.6 (HH) 0.5 - 1.9 mmol/L    Comment: CRITICAL RESULT CALLED TO, READ BACK BY AND VERIFIED WITH ALLY Danyia Borunda @1924  08/13/18 AKT Performed at [facility], [address] [place], [place] [address]    Dg Chest 2 View  Result Date: 08/13/2018  CLINICAL DATA:  Upper respiratory infection, dyspnea, hypoxia EXAM: CHEST - 2 VIEW COMPARISON:   08/29/2009 chest radiograph. FINDINGS: Intact sternotomy wires. Retained epicardial pacer leads overlie the heart. Stable cardiomediastinal silhouette with normal heart size. No pneumothorax. No pleural effusion. Mildly hyperinflated lungs. No pulmonary edema. No acute consolidative airspace disease. Minimal right lung base scarring. IMPRESSION: 1. No acute cardiopulmonary disease. 2. Mildly hyperinflated lungs, suggesting COPD. 3. Minimal right lung base scarring. Electronically Signed   By: Ilona Sorrel M.D.   On: 08/13/2018 16:22   Ct Abdomen Pelvis W Contrast  Result Date: 08/13/2018 CLINICAL DATA:  Flu like illness since last week, congestion, body aches, generalized malaise, nausea, vomiting, diarrhea, history GERD and hypertension EXAM: CT ABDOMEN AND PELVIS WITH CONTRAST TECHNIQUE: Multidetector CT imaging of the abdomen and pelvis was performed using the standard protocol following bolus administration of intravenous contrast. Sagittal and coronal MPR images reconstructed from axial data set. CONTRAST:  72mL OMNIPAQUE IOHEXOL 300 MG/ML SOLN IV. No oral contrast. COMPARISON:  None FINDINGS: Lower chest: Peribronchial thickening. Infiltrate identified at posterior LEFT lower lobe base. Hepatobiliary: Gallbladder and liver normal appearance. Pancreas: Normal appearance Spleen: Normal appearance Adrenals/Urinary Tract: Adrenal glands normal appearance. BILATERAL renal cortical atrophy and cortical scarring. Small BILATERAL renal cysts. Small nonobstructing BILATERAL renal calculi. No hydronephrosis, hydroureter or ureteral calcification. Bladder decompressed. Stomach/Bowel: Normal appendix. Sigmoid diverticulosis. Minimal hazy pericolic infiltrative changes at sigmoid colon question subtle diverticulitis. No extraluminal gas or fluid collections. Stomach and bowel loops otherwise normal appearance. Vascular/Lymphatic: Atherosclerotic calcifications of aorta and iliac arteries. Stents identified RIGHT  external iliac artery and LEFT superficial femoral artery. Aorta normal caliber. No adenopathy. Reproductive: Uterus and ovaries normal appearance Other: No free air or free fluid.  No hernia. Musculoskeletal: Bones demineralized. IMPRESSION: Sigmoid diverticulosis with subtle pericolic infiltrative change question subtle diverticulitis. BILATERAL renal cortical atrophy, renal cysts and nonobstructing calculi. Bronchitic changes with minimal LEFT lower lobe infiltrate. No other acute intra-abdominal or intrapelvic abnormalities. Electronically Signed   By: Lavonia Dana M.D.   On: 08/13/2018 16:58    Pending Labs Unresulted Labs (From admission, onward)    Start     Ordered   08/13/18 1636  Blood culture (routine x 2)  BLOOD CULTURE X 2,   STAT     08/13/18 1635   08/13/18 1636  Urine culture  ONCE - STAT,   STAT     08/13/18 1635   Signed and Held  CBC  (enoxaparin (LOVENOX)    CrCl >/= 30 ml/min)  Once,   R    Comments:  Baseline for enoxaparin therapy IF NOT ALREADY DRAWN.  Notify MD if PLT < 100 K.    Signed and Held   Signed and Held  Creatinine, serum  (enoxaparin (LOVENOX)    CrCl >/= 30 ml/min)  Once,   R    Comments:  Baseline for enoxaparin therapy IF NOT ALREADY DRAWN.    Signed and Held   Signed and Held  Creatinine, serum  (enoxaparin (LOVENOX)    CrCl >/= 30 ml/min)  Weekly,   R    Comments:  while on enoxaparin therapy    Signed and Held          Vitals/Pain Today's Vitals   08/13/18 1803 08/13/18 1830 08/13/18 2025 08/13/18 2026  BP:  (!) 107/49  (!) 125/53  Pulse:  97  86  Resp:  (!) 21  20  Temp:      SpO2:  99%  99%  Weight:      PainSc: 0-No pain  0-No pain     Isolation Precautions Droplet precaution  Medications Medications  sodium chloride flush (NS) 0.9 % injection 3 mL (3 mLs Intravenous Not Given 08/13/18 2025)  oseltamivir (TAMIFLU) capsule 75 mg (has no administration in time range)  Ampicillin-Sulbactam (UNASYN) 3 g in sodium chloride 0.9 % 100  mL IVPB (3 g Intravenous New Bag/Given 08/13/18 2024)  ondansetron (ZOFRAN) injection 4 mg (4 mg Intravenous Given 08/13/18 1716)  sodium chloride 0.9 % bolus 1,000 mL (1,000 mLs Intravenous Bolus 08/13/18 1737)  iohexol (OMNIPAQUE) 300 MG/ML solution 75 mL (75 mLs Intravenous Contrast Given 08/13/18 1632)  methylPREDNISolone sodium succinate (SOLU-MEDROL) 125 mg/2 mL injection 125 mg (125 mg Intravenous Given 08/13/18 1716)  azithromycin (ZITHROMAX) 500 mg in sodium chloride 0.9 % 250 mL IVPB (0 mg Intravenous Stopped 08/13/18 1839)  cefTRIAXone (ROCEPHIN) 1 g in sodium chloride 0.9 % 100 mL IVPB ( Intravenous Stopped 08/13/18 1834)  famotidine (PEPCID) IVPB 20 mg premix ( Intravenous Stopped 08/13/18 1759)  fentaNYL (SUBLIMAZE) injection 50 mcg (50 mcg Intravenous Given 08/13/18 1716)  oseltamivir (TAMIFLU) capsule 75 mg (75 mg Oral Given 08/13/18 1851)    Mobility walks

## 2018-08-13 NOTE — ED Triage Notes (Signed)
Flu like illness which began last week.  Pt describes congestion and body aches and generalized malaise.  She also adds that she has had nausea and vomiting and diarrhea.  Pt spo2 is 88% on RA, pt states that she has been having some sob when I ask.

## 2018-08-13 NOTE — Progress Notes (Signed)
PHARMACY NOTE:  ANTIMICROBIAL RENAL DOSAGE ADJUSTMENT  Current antimicrobial regimen includes a mismatch between antimicrobial dosage and estimated renal function.  As per policy approved by the Pharmacy & Therapeutics and Medical Executive Committees, the antimicrobial dosage will be adjusted accordingly.  Current antimicrobial dosage:  Tamiflu 75 mg BID  Indication: Influenza A  Renal Function:  Estimated Creatinine Clearance: 23.7 mL/min (A) (by C-G formula based on SCr of 1.51 mg/dL (H)). []      On intermittent HD, scheduled: []      On CRRT    Antimicrobial dosage has been changed to:  Tamiflu 30 mg QD  Additional comments:   Thank you for allowing pharmacy to be a part of this patient's care.  Forrest Moron, PharmD Clinical Pharmacist 08/13/2018 8:28 PM

## 2018-08-13 NOTE — H&P (Signed)
[place] [facility] [place] NAME: Olivia Lane    MR#:  [id number]  DATE OF BIRTH:  Feb 11, 1936  DATE OF ADMISSION:  08/13/2018  PRIMARY CARE PHYSICIAN: Lavera Guise, MD   REQUESTING/REFERRING PHYSICIAN: Dr. Mariea Clonts  CHIEF COMPLAINT:  increasing shortness of breath cough and congestion  HISTORY OF PRESENT ILLNESS:  Olivia Lane  is a 83 y.o. female with a known history of hyperlipidemia, hypertension, COPD comes to the emergency room with increasing shortness of breath cold and cough symptoms along with nausea and epigastric pain. Patient has some productive cough and rhinorrhea. She is will progressively worsening dyspnea. She was found to be hypoxic in the 80s in the ER.  Coping the ER showed elevated lactic acid, tachycardia, positive for flu and bronchitis changes with left lower lobe infiltrate on CT of the abdomen  CT of the abdomen also showed some subtle changes of diverticulitis sigmoid colon along with sigmoid diverticulosis. Patient did not have any pain in the left lower quadrant.  Received broad-spectrum antibiotics in the ER and Tamiflu   PAST MEDICAL HISTORY:   Past Medical History:  Diagnosis Date  . Anemia   . Gastro-esophageal reflux   . Hyperlipidemia   . Hypertension     PAST SURGICAL HISTOIRY:   Past Surgical History:  Procedure Laterality Date  . APPENDECTOMY  2004  . BYPASS GRAFT  2014  . CATARACT EXTRACTION  [id number]  . COLONOSCOPY  2004    SOCIAL HISTORY:   Social History   Tobacco Use  . Smoking status: Former Smoker    Packs/day: 1.00    Years: 40.00    Pack years: 40.00    Types: Cigarettes    Last attempt to quit: 03/24/2013    Years since quitting: 5.3  . Smokeless tobacco: Never Used  Substance Use Topics  . Alcohol use: No    FAMILY HISTORY:   Family History  Problem Relation Age of Onset  . Hypertension Father   . Coronary artery disease Sister   . Hypertension Sister    . Diabetes Brother   . Hypertension Brother     DRUG ALLERGIES:  No Known Allergies  REVIEW OF SYSTEMS:  Review of Systems  Constitutional: Positive for fever and malaise/fatigue. Negative for chills and weight loss.  HENT: Negative for ear discharge, ear pain and nosebleeds.   Eyes: Negative for blurred vision, pain and discharge.  Respiratory: Positive for sputum production and shortness of breath. Negative for wheezing and stridor.   Cardiovascular: Negative for chest pain, palpitations, orthopnea and PND.  Gastrointestinal: Negative for abdominal pain, diarrhea, nausea and vomiting.  Genitourinary: Negative for frequency and urgency.  Musculoskeletal: Negative for back pain and joint pain.  Neurological: Positive for weakness. Negative for sensory change, speech change and focal weakness.  Psychiatric/Behavioral: Negative for depression and hallucinations. The patient is not nervous/anxious.      MEDICATIONS AT HOME:   Prior to Admission medications   Medication Sig Start Date End Date Taking? Authorizing Provider  amLODipine (NORVASC) 10 MG tablet TAKE ONE TABLET BY MOUTH EVERY NIGHT FORHYPERTENSION 04/07/18  Yes Ronnell Freshwater, NP  atorvastatin (LIPITOR) 40 MG tablet TAKE 1 TABLET BY MOUTH AT BEDTIME 10/28/17  Yes Lavera Guise, MD  budesonide-formoterol [facility]) 80-4.5 MCG/ACT inhaler Inhale 1 puff into the lungs 2 (two) times daily. 07/18/18  Yes Ronnell Freshwater, NP  clopidogrel (PLAVIX) 75 MG tablet TAKE 1 TABLET BY MOUTH EVERY DAY 01/29/18  Yes Boscia, Heather E, NP  metoprolol tartrate (LOPRESSOR) 50 MG tablet TAKE 1 TABLET BY MOUTH EVERY 12 HOURS 01/29/18  Yes Boscia, Heather E, NP  ondansetron (ZOFRAN ODT) 4 MG disintegrating tablet Take 1 tablet (4 mg total) by mouth every 8 (eight) hours as needed for nausea or vomiting. 08/27/16  Yes Lucilla Lame, MD  pantoprazole (PROTONIX) 40 MG tablet Take 1 tablet (40 mg total) by mouth 2 (two) times daily. 05/22/18  Yes Boscia,  Heather E, NP      VITAL SIGNS:  Blood pressure (!) 107/49, pulse 97, temperature 99.5 F (37.5 C), resp. rate (!) 21, weight 52.2 kg, SpO2 99 %.  PHYSICAL EXAMINATION:  GENERAL:  83 y.o.-year-old patient lying in the bed with no acute distress.  EYES: Pupils equal, round, reactive to light and accommodation. No scleral icterus. Extraocular muscles intact.  HEENT: Head atraumatic, normocephalic. Oropharynx and nasopharynx clear.  NECK:  Supple, no jugular venous distention. No thyroid enlargement, no tenderness.  LUNGS: coarse breath sounds bilaterally, no wheezing, rales,rhonchi or crepitation. No use of accessory muscles of respiration.  CARDIOVASCULAR: S1, S2 normal. No murmurs, rubs, or gallops.  ABDOMEN: Soft, nontender, nondistended. Bowel sounds present. No organomegaly or mass.  EXTREMITIES: No pedal edema, cyanosis, or clubbing.  NEUROLOGIC: Cranial nerves II through XII are intact. Muscle strength 5/5 in all extremities. Sensation intact. Gait not checked.  PSYCHIATRIC: The patient is alert and oriented x 3.  SKIN: No obvious rash, lesion, or ulcer.   LABORATORY PANEL:   CBC Recent Labs  Lab 08/13/18 1534  WBC 11.1*  HGB 13.1  HCT 42.7  PLT 301   ------------------------------------------------------------------------------------------------------------------  Chemistries  Recent Labs  Lab 08/13/18 1534  NA 136  K 4.5  CL 109  CO2 17*  GLUCOSE 141*  BUN 19  CREATININE 1.51*  CALCIUM 8.6*  AST 38  ALT 15  ALKPHOS 93  BILITOT 0.7   ------------------------------------------------------------------------------------------------------------------  Cardiac Enzymes Recent Labs  Lab 08/13/18 1534  TROPONINI <0.03   ------------------------------------------------------------------------------------------------------------------  RADIOLOGY:  Dg Chest 2 View  Result Date: 08/13/2018 CLINICAL DATA:  Upper respiratory infection, dyspnea, hypoxia EXAM:  CHEST - 2 VIEW COMPARISON:  08/29/2009 chest radiograph. FINDINGS: Intact sternotomy wires. Retained epicardial pacer leads overlie the heart. Stable cardiomediastinal silhouette with normal heart size. No pneumothorax. No pleural effusion. Mildly hyperinflated lungs. No pulmonary edema. No acute consolidative airspace disease. Minimal right lung base scarring. IMPRESSION: 1. No acute cardiopulmonary disease. 2. Mildly hyperinflated lungs, suggesting COPD. 3. Minimal right lung base scarring. Electronically Signed   By: Ilona Sorrel M.D.   On: 08/13/2018 16:22   Ct Abdomen Pelvis W Contrast  Result Date: 08/13/2018 CLINICAL DATA:  Flu like illness since last week, congestion, body aches, generalized malaise, nausea, vomiting, diarrhea, history GERD and hypertension EXAM: CT ABDOMEN AND PELVIS WITH CONTRAST TECHNIQUE: Multidetector CT imaging of the abdomen and pelvis was performed using the standard protocol following bolus administration of intravenous contrast. Sagittal and coronal MPR images reconstructed from axial data set. CONTRAST:  59mL OMNIPAQUE IOHEXOL 300 MG/ML SOLN IV. No oral contrast. COMPARISON:  None FINDINGS: Lower chest: Peribronchial thickening. Infiltrate identified at posterior LEFT lower lobe base. Hepatobiliary: Gallbladder and liver normal appearance. Pancreas: Normal appearance Spleen: Normal appearance Adrenals/Urinary Tract: Adrenal glands normal appearance. BILATERAL renal cortical atrophy and cortical scarring. Small BILATERAL renal cysts. Small nonobstructing BILATERAL renal calculi. No hydronephrosis, hydroureter or ureteral calcification. Bladder decompressed. Stomach/Bowel: Normal appendix. Sigmoid diverticulosis. Minimal hazy pericolic infiltrative changes at sigmoid colon  question subtle diverticulitis. No extraluminal gas or fluid collections. Stomach and bowel loops otherwise normal appearance. Vascular/Lymphatic: Atherosclerotic calcifications of aorta and iliac arteries.  Stents identified RIGHT external iliac artery and LEFT superficial femoral artery. Aorta normal caliber. No adenopathy. Reproductive: Uterus and ovaries normal appearance Other: No free air or free fluid.  No hernia. Musculoskeletal: Bones demineralized. IMPRESSION: Sigmoid diverticulosis with subtle pericolic infiltrative change question subtle diverticulitis. BILATERAL renal cortical atrophy, renal cysts and nonobstructing calculi. Bronchitic changes with minimal LEFT lower lobe infiltrate. No other acute intra-abdominal or intrapelvic abnormalities. Electronically Signed   By: Lavonia Dana M.D.   On: 08/13/2018 16:58    EKG:  tachycardia  IMPRESSION AND PLAN:   Olivia Lane  is a 83 y.o. female with a known history of hyperlipidemia, hypertension, COPD comes to the emergency room with increasing shortness of breath cold and cough symptoms along with nausea and epigastric pain. Patient has some productive cough and rhinorrhea. She is will progressively worsening dyspnea. She was found to be hypoxic in the 80s in the ER.  1. Acute hypoxic respiratory failure secondary to pneumonia and influenza -IV antibiotics -Tamiflu for five days -wean off oxygen when able to -incentive spirometer  2. Tachycardia due to number one  3. Pneumonia -Iv Unasyn  4. Subtle changes of diverticulitis -Iv unasyn should cover  5. DVT prophylaxis lovenox  D/wwith patient son All the records are reviewed and case discussed with ED provider.   CODE STATUS: full  TOTAL TIME TAKING CARE OF THIS PATIENT: 50 minutes.    Fritzi Mandes M.D on 08/13/2018 at 7:50 PM  Between 7am to 6pm - Pager - [phone]  After 6pm go to [email or web address] - password [email or web address] [facility]  [facility]  Office  [phone]  CC: Primary care physician; Lavera Guise, MD

## 2018-08-13 NOTE — Progress Notes (Signed)
Pharmacy Antibiotic Note  Olivia Lane is a 83 y.o. female admitted on 08/13/2018 with pneumonia and possible diverticulitis.  Pharmacy has been consulted for Unasyn dosing for CAP and GI anaerobic coverage.  Plan: Unasyn 3 g IV q12h (adjusted for CrCl 23.7)  Weight: 115 lb (52.2 kg)  Temp (24hrs), Avg:99.5 F (37.5 C), Min:99.5 F (37.5 C), Max:99.5 F (37.5 C)  Recent Labs  Lab 08/13/18 1534 08/13/18 1702  WBC 11.1*  --   CREATININE 1.51*  --   LATICACIDVEN  --  3.6*    Estimated Creatinine Clearance: 23.7 mL/min (A) (by C-G formula based on SCr of 1.51 mg/dL (H)).    No Known Allergies  Antimicrobials this admission: Ceftriaxone 2/12 x 1 Azithromycin 2/12 x 1 Unasyn 2/12 >>  Dose adjustments this admission: NA  Microbiology results: 2/12 BCx: pending 2/12 UCx: pending   Thank you for allowing pharmacy to be a part of this patient's care.  Tawnya Crook, PharmD Pharmacy Resident

## 2018-08-13 NOTE — ED Notes (Signed)
Pt off the floor at this time via stretcher with NA and Son.

## 2018-08-14 LAB — BASIC METABOLIC PANEL
Anion gap: 4 — ABNORMAL LOW (ref 5–15)
BUN: 18 mg/dL (ref 8–23)
CO2: 20 mmol/L — AB (ref 22–32)
Calcium: 8.1 mg/dL — ABNORMAL LOW (ref 8.9–10.3)
Chloride: 114 mmol/L — ABNORMAL HIGH (ref 98–111)
Creatinine, Ser: 1.26 mg/dL — ABNORMAL HIGH (ref 0.44–1.00)
GFR calc Af Amer: 46 mL/min — ABNORMAL LOW (ref >60)
GFR calc non Af Amer: 40 mL/min — ABNORMAL LOW (ref >60)
GLUCOSE: 139 mg/dL — AB (ref 70–99)
Potassium: 4.4 mmol/L (ref 3.5–5.1)
Sodium: 138 mmol/L (ref 135–145)

## 2018-08-14 MED ORDER — SODIUM CHLORIDE 0.9 % IV SOLN: INTRAVENOUS | Status: DC | PRN

## 2018-08-14 NOTE — Progress Notes (Signed)
[place] [facility] [place] NAME: Olivia Lane    MR#:  [id number]  DATE OF BIRTH:  08/18/1935  SUBJECTIVE:  CHIEF COMPLAINT:   Chief Complaint  Patient presents with  . Illness  . Abdominal Pain   -Came in with flu and pneumonia.  Feels some better.  Still short of breath on exertion  REVIEW OF SYSTEMS:  Review of Systems  Constitutional: Negative for chills, fever and malaise/fatigue.  HENT: Negative for congestion, ear discharge, hearing loss and nosebleeds.   Eyes: Negative for blurred vision and double vision.  Respiratory: Positive for cough and shortness of breath. Negative for wheezing.   Cardiovascular: Negative for chest pain, palpitations and leg swelling.  Gastrointestinal: Negative for abdominal pain, constipation, diarrhea, nausea and vomiting.  Genitourinary: Negative for dysuria.  Musculoskeletal: Positive for myalgias.  Neurological: Negative for dizziness, focal weakness, seizures, weakness and headaches.  Psychiatric/Behavioral: Negative for depression.    DRUG ALLERGIES:  No Known Allergies  VITALS:  Blood pressure (!) 107/47, pulse 68, temperature 97.6 F (36.4 C), temperature source Oral, resp. rate 20, weight 52.2 kg, SpO2 91 %.  PHYSICAL EXAMINATION:  Physical Exam   GENERAL:  83 y.o.-year-old patient lying in the bed with no acute distress.  EYES: Pupils equal, round, reactive to light and accommodation. No scleral icterus. Extraocular muscles intact.  HEENT: Head atraumatic, normocephalic. Oropharynx and nasopharynx clear.  NECK:  Supple, no jugular venous distention. No thyroid enlargement, no tenderness.  LUNGS: Normal breath sounds bilaterally, no wheezing, rales  or crepitation. No use of accessory muscles of respiration.  Left basilar rhonchi CARDIOVASCULAR: S1, S2 normal. No rubs, or gallops.  3/6 systolic murmur is present ABDOMEN: Soft, nontender, nondistended. Bowel sounds present. No organomegaly  or mass.  EXTREMITIES: No pedal edema, cyanosis, or clubbing.  NEUROLOGIC: Cranial nerves II through XII are intact. Muscle strength 5/5 in all extremities. Sensation intact. Gait not checked.  Global weakness PSYCHIATRIC: The patient is alert and oriented x 3.  SKIN: No obvious rash, lesion, or ulcer.    LABORATORY PANEL:   CBC Recent Labs  Lab 08/13/18 1534  WBC 11.1*  HGB 13.1  HCT 42.7  PLT 301   ------------------------------------------------------------------------------------------------------------------  Chemistries  Recent Labs  Lab 08/13/18 1534 08/14/18 0755  NA 136 138  K 4.5 4.4  CL 109 114*  CO2 17* 20*  GLUCOSE 141* 139*  BUN 19 18  CREATININE 1.51* 1.26*  CALCIUM 8.6* 8.1*  AST 38  --   ALT 15  --   ALKPHOS 93  --   BILITOT 0.7  --    ------------------------------------------------------------------------------------------------------------------  Cardiac Enzymes Recent Labs  Lab 08/13/18 1534  TROPONINI <0.03   ------------------------------------------------------------------------------------------------------------------  RADIOLOGY:  Dg Chest 2 View  Result Date: 08/13/2018 CLINICAL DATA:  Upper respiratory infection, dyspnea, hypoxia EXAM: CHEST - 2 VIEW COMPARISON:  08/29/2009 chest radiograph. FINDINGS: Intact sternotomy wires. Retained epicardial pacer leads overlie the heart. Stable cardiomediastinal silhouette with normal heart size. No pneumothorax. No pleural effusion. Mildly hyperinflated lungs. No pulmonary edema. No acute consolidative airspace disease. Minimal right lung base scarring. IMPRESSION: 1. No acute cardiopulmonary disease. 2. Mildly hyperinflated lungs, suggesting COPD. 3. Minimal right lung base scarring. Electronically Signed   By: Ilona Sorrel M.D.   On: 08/13/2018 16:22   Ct Abdomen Pelvis W Contrast  Result Date: 08/13/2018 CLINICAL DATA:  Flu like illness since last week, congestion, body aches, generalized  malaise, nausea, vomiting, diarrhea, history GERD and  hypertension EXAM: CT ABDOMEN AND PELVIS WITH CONTRAST TECHNIQUE: Multidetector CT imaging of the abdomen and pelvis was performed using the standard protocol following bolus administration of intravenous contrast. Sagittal and coronal MPR images reconstructed from axial data set. CONTRAST:  38mL OMNIPAQUE IOHEXOL 300 MG/ML SOLN IV. No oral contrast. COMPARISON:  None FINDINGS: Lower chest: Peribronchial thickening. Infiltrate identified at posterior LEFT lower lobe base. Hepatobiliary: Gallbladder and liver normal appearance. Pancreas: Normal appearance Spleen: Normal appearance Adrenals/Urinary Tract: Adrenal glands normal appearance. BILATERAL renal cortical atrophy and cortical scarring. Small BILATERAL renal cysts. Small nonobstructing BILATERAL renal calculi. No hydronephrosis, hydroureter or ureteral calcification. Bladder decompressed. Stomach/Bowel: Normal appendix. Sigmoid diverticulosis. Minimal hazy pericolic infiltrative changes at sigmoid colon question subtle diverticulitis. No extraluminal gas or fluid collections. Stomach and bowel loops otherwise normal appearance. Vascular/Lymphatic: Atherosclerotic calcifications of aorta and iliac arteries. Stents identified RIGHT external iliac artery and LEFT superficial femoral artery. Aorta normal caliber. No adenopathy. Reproductive: Uterus and ovaries normal appearance Other: No free air or free fluid.  No hernia. Musculoskeletal: Bones demineralized. IMPRESSION: Sigmoid diverticulosis with subtle pericolic infiltrative change question subtle diverticulitis. BILATERAL renal cortical atrophy, renal cysts and nonobstructing calculi. Bronchitic changes with minimal LEFT lower lobe infiltrate. No other acute intra-abdominal or intrapelvic abnormalities. Electronically Signed   By: Lavonia Dana M.D.   On: 08/13/2018 16:58    EKG:   Orders placed or performed during the hospital encounter of 08/13/18  .  ED EKG  . ED EKG    ASSESSMENT AND PLAN:   83 year old female with past medical history significant for hypertension, COPD and hyperlipidemia presents worsening shortness of breath and cough.  1.  Acute hypoxic respiratory failure-secondary to influenza illness and also left lower lobe pneumonia -Follow blood cultures. -Continue supplemental oxygen as needed -On Tamiflu -Currently on Unasyn given her abdominal illness as well.  2.  Diverticulitis-currently on IV Unasyn. -We will change to oral antibiotics tomorrow  3.  COPD-stable, continue inhalers.  4.  GERD-Protonix  5.  DVT prophylaxis-Lovenox     All the records are reviewed and case discussed with Care Management/Social Workerr. Management plans discussed with the patient, family and they are in agreement.  CODE STATUS: Full code  TOTAL TIME TAKING CARE OF THIS PATIENT: 37 minutes.   POSSIBLE D/C IN 1-2 DAYS, DEPENDING ON CLINICAL CONDITION.   Gladstone Lighter M.D on 08/14/2018 at 10:56 AM  Between 7am to 6pm - Pager - [phone]  After 6pm go to [email or web address] - password [email or web address] [facility]  Office  [phone]  CC: Primary care physician; Lavera Guise, MD

## 2018-08-15 LAB — BASIC METABOLIC PANEL
ANION GAP: 6 (ref 5–15)
BUN: 22 mg/dL (ref 8–23)
CO2: 20 mmol/L — ABNORMAL LOW (ref 22–32)
Calcium: 8.3 mg/dL — ABNORMAL LOW (ref 8.9–10.3)
Chloride: 115 mmol/L — ABNORMAL HIGH (ref 98–111)
Creatinine, Ser: 1.31 mg/dL — ABNORMAL HIGH (ref 0.44–1.00)
GFR calc Af Amer: 44 mL/min — ABNORMAL LOW (ref >60)
GFR calc non Af Amer: 38 mL/min — ABNORMAL LOW (ref >60)
Glucose, Bld: 122 mg/dL — ABNORMAL HIGH (ref 70–99)
Potassium: 4.2 mmol/L (ref 3.5–5.1)
Sodium: 141 mmol/L (ref 135–145)

## 2018-08-15 LAB — URINE CULTURE: Culture: <10000 — AB

## 2018-08-15 MED ORDER — AMOXICILLIN-POT CLAVULANATE 875-125 MG PO TABS: 1.0000 | ORAL_TABLET | Freq: Two times a day (BID) | ORAL | 0 refills | Status: DC

## 2018-08-15 MED ORDER — OSELTAMIVIR PHOSPHATE 30 MG PO CAPS: 30.0000 mg | ORAL_CAPSULE | Freq: Every day | ORAL | 0 refills | Status: AC

## 2018-08-15 NOTE — Discharge Instructions (Signed)

## 2018-08-15 NOTE — Discharge Summary (Signed)
[facility] [facility] [place] NAME: Olivia Lane    MR#:  [id number]  DATE OF BIRTH:  05-21-1936  DATE OF ADMISSION:  08/13/2018   ADMITTING PHYSICIAN: Fritzi Mandes, MD  DATE OF DISCHARGE: 08/15/18  PRIMARY CARE PHYSICIAN: Lavera Guise, MD   ADMISSION DIAGNOSIS:   Diverticulitis [K57.92] Influenza A [J10.1] Hypoxia [R09.02] Pneumonia of left lower lobe due to infectious organism ([place]) [J18.1] Sepsis, due to unspecified organism, unspecified whether acute organ dysfunction present ([place]) [A41.9]  DISCHARGE DIAGNOSIS:   Active Problems:   Sepsis ([name])   SECONDARY DIAGNOSIS:   Past Medical History:  Diagnosis Date  . Anemia   . Gastro-esophageal reflux   . Hyperlipidemia   . Hypertension     HOSPITAL COURSE:   83 year old female with past medical history significant for hypertension, COPD and hyperlipidemia presents worsening shortness of breath and cough.  1.  Acute hypoxic respiratory failure-secondary to influenza illness and also left lower lobe pneumonia -negative blood cultures. -off supplemental oxygen as needed -On Tamiflu- finish the course -received Unasyn-changed to Augmentin at discharge.  2.  Diverticulitis-symptoms resolved.  On Augmentin at discharge  3.  COPD-stable, continue inhalers.  4.  GERD-Protonix  5.    Hypertension-on Norvasc  Patient is up and ambulatory.  Will be discharged home today  DISCHARGE CONDITIONS:   Guarded  CONSULTS OBTAINED:   None  DRUG ALLERGIES:   No Known Allergies DISCHARGE MEDICATIONS:   Allergies as of 08/15/2018   No Known Allergies     Medication List    TAKE these medications   amLODipine 10 MG tablet Commonly known as:  NORVASC TAKE ONE TABLET BY MOUTH EVERY NIGHT FORHYPERTENSION   amoxicillin-clavulanate 875-125 MG tablet Commonly known as:  AUGMENTIN Take 1 tablet by mouth every 12 (twelve) hours for 10 days.   atorvastatin 40 MG  tablet Commonly known as:  LIPITOR TAKE 1 TABLET BY MOUTH AT BEDTIME   budesonide-formoterol 80-4.5 MCG/ACT inhaler Commonly known as:  SYMBICORT Inhale 1 puff into the lungs 2 (two) times daily.   clopidogrel 75 MG tablet Commonly known as:  PLAVIX TAKE 1 TABLET BY MOUTH EVERY DAY   metoprolol tartrate 50 MG tablet Commonly known as:  LOPRESSOR TAKE 1 TABLET BY MOUTH EVERY 12 HOURS   ondansetron 4 MG disintegrating tablet Commonly known as:  ZOFRAN ODT Take 1 tablet (4 mg total) by mouth every 8 (eight) hours as needed for nausea or vomiting.   oseltamivir 30 MG capsule Commonly known as:  TAMIFLU Take 1 capsule (30 mg total) by mouth daily for 3 days.   pantoprazole 40 MG tablet Commonly known as:  PROTONIX Take 1 tablet (40 mg total) by mouth 2 (two) times daily.        DISCHARGE INSTRUCTIONS:   1. PCP f/u in 1 week  DIET:   Cardiac diet  ACTIVITY:   Activity as tolerated  OXYGEN:   Home Oxygen: No.  Oxygen Delivery: room air  DISCHARGE LOCATION:   home   If you experience worsening of your admission symptoms, develop shortness of breath, life threatening emergency, suicidal or homicidal thoughts you must seek medical attention immediately by calling 911 or calling your MD immediately  if symptoms less severe.  You Must read complete instructions/literature along with all the possible adverse reactions/side effects for all the Medicines you take and that have been prescribed to you. Take any new Medicines after you have completely understood and accpet all the  possible adverse reactions/side effects.   Please note  You were cared for by a hospitalist during your hospital stay. If you have any questions about your discharge medications or the care you received while you were in the hospital after you are discharged, you can call the unit and asked to speak with the hospitalist on call if the hospitalist that took care of you is not available. Once you are  discharged, your primary care physician will handle any further medical issues. Please note that NO REFILLS for any discharge medications will be authorized once you are discharged, as it is imperative that you return to your primary care physician (or establish a relationship with a primary care physician if you do not have one) for your aftercare needs so that they can reassess your need for medications and monitor your lab values.    On the day of Discharge:  VITAL SIGNS:   Blood pressure 114/72, pulse 82, temperature (!) 97.5 F (36.4 C), temperature source Oral, resp. rate 20, height 5\' 2"  (1.575 m), weight 52.2 kg, SpO2 93 %.  PHYSICAL EXAMINATION:    GENERAL:  83 y.o.-year-old patient lying in the bed with no acute distress.  EYES: Pupils equal, round, reactive to light and accommodation. No scleral icterus. Extraocular muscles intact.  HEENT: Head atraumatic, normocephalic. Oropharynx and nasopharynx clear.  NECK:  Supple, no jugular venous distention. No thyroid enlargement, no tenderness.  LUNGS: Normal breath sounds bilaterally, no wheezing, rales  or crepitation. No use of accessory muscles of respiration.  Left basilar rhonchi CARDIOVASCULAR: S1, S2 normal. No rubs, or gallops.  3/6 systolic murmur is present ABDOMEN: Soft, nontender, nondistended. Bowel sounds present. No organomegaly or mass.  EXTREMITIES: No pedal edema, cyanosis, or clubbing.  NEUROLOGIC: Cranial nerves II through XII are intact. Muscle strength 5/5 in all extremities. Sensation intact. Gait not checked.  Global weakness PSYCHIATRIC: The patient is alert and oriented x 3.  SKIN: No obvious rash, lesion, or ulcer.   DATA REVIEW:   CBC Recent Labs  Lab 08/13/18 1534  WBC 11.1*  HGB 13.1  HCT 42.7  PLT 301    Chemistries  Recent Labs  Lab 08/13/18 1534  08/15/18 0451  NA 136   < > 141  K 4.5   < > 4.2  CL 109   < > 115*  CO2 17*   < > 20*  GLUCOSE 141*   < > 122*  BUN 19   < > 22   CREATININE 1.51*   < > 1.31*  CALCIUM 8.6*   < > 8.3*  AST 38  --   --   ALT 15  --   --   ALKPHOS 93  --   --   BILITOT 0.7  --   --    < > = values in this interval not displayed.     Microbiology Results  Results for orders placed or performed during the hospital encounter of 08/13/18  Blood culture (routine x 2)     Status: None (Preliminary result)   Collection Time: 08/13/18  5:02 PM  Result Value Ref Range Status   Specimen Description BLOOD BLOOD RIGHT FOREARM  Final   Special Requests   Final    BOTTLES DRAWN AEROBIC AND ANAEROBIC Blood Culture adequate volume   Culture   Final    NO GROWTH 2 DAYS Performed at [facility], [address] [place], [place] [address]    Report Status PENDING  Incomplete  Urine culture     Status: Abnormal   Collection Time: 08/13/18  5:02 PM  Result Value Ref Range Status   Specimen Description   Final    URINE, RANDOM Performed at [facility], [address], [place], [place] [address]    Special Requests   Final    NONE Performed at [facility], [address] [place], [place] [address]    Culture (A)  Final    <10,000 COLONIES/mL INSIGNIFICANT GROWTH Performed at [facility] [address], [place], [place] [address]    Report Status 08/15/2018 FINAL  Final  Blood culture (routine x 2)     Status: None (Preliminary result)   Collection Time: 08/13/18  5:06 PM  Result Value Ref Range Status   Specimen Description BLOOD BLOOD LEFT FOREARM  Final   Special Requests   Final    BOTTLES DRAWN AEROBIC AND ANAEROBIC Blood Culture adequate volume   Culture   Final    NO GROWTH 2 DAYS Performed at [facility], [address], [place], [place] [address]    Report Status PENDING  Incomplete    RADIOLOGY:  No results found.   Management plans discussed with the patient, family and they are in agreement.  CODE STATUS:     Code Status Orders  (From admission, onward)          Start     Ordered   08/13/18 2145  Full code  Continuous     08/13/18 2144        Code Status History    This patient has a current code status but no historical code status.      TOTAL TIME TAKING CARE OF THIS PATIENT: 38 minutes.    Gladstone Lighter M.D on 08/15/2018 at 3:33 PM  Between 7am to 6pm - Pager - [phone]  After 6pm go to [email or web address] - password [email or web address]  [facility]  Office  [phone]  CC: Primary care physician; Lavera Guise, MD   Note: This dictation was prepared with Dragon dictation along with smaller phrase technology. Any transcriptional errors that result from this process are unintentional.

## 2018-08-18 LAB — CULTURE, BLOOD (ROUTINE X 2)
Culture: NO GROWTH
Culture: NO GROWTH
SPECIAL REQUESTS: ADEQUATE
Special Requests: ADEQUATE

## 2018-08-21 ENCOUNTER — Other Ambulatory Visit: Payer: Self-pay

## 2018-08-21 NOTE — Patient Outreach (Signed)
[facility]) Care Management  08/21/2018  Olivia Lane [facility] 01-Dec-1935 [id number]  EMMI: general discharge red alert Referral date: 08/21/18 Referral reason: lost interest in things.  Insurance: united [employer] care Day # 4 Attempt #1  Telephone call to patient regarding EMMI general discharge red alert. Unable to reach patient or leave voice message. Phone only rang.    PLAN: RNCm will attempt 2nd telephone call to patient within 4 business days.  RNCM will send outreach letter to attempt contact   Quinn Plowman RN,BSN,CCM [facility] Telephonic  [phone]

## 2018-08-22 ENCOUNTER — Inpatient Hospital Stay: Payer: Medicare Other

## 2018-08-25 ENCOUNTER — Inpatient Hospital Stay: Payer: Medicare Other | Admitting: Oncology

## 2018-08-27 ENCOUNTER — Other Ambulatory Visit: Payer: Self-pay

## 2018-08-27 NOTE — Patient Outreach (Signed)
[facility]) Care Management  08/27/2018  Olivia Lane [facility] [birth year] [id number]  EMMI: general discharge red alert Referral date: 08/21/18 Referral reason: lost interest in things.  Insurance: united [employer] care Day # 1 Attempt #2  Telephone call to patient regarding EMMI general discharge red alert.  Unable to reach patient. HIPAA compliant voice message left with call back phone number.   PLAN: RNCM will attempt #3  telephone call to patient within 4 business days.   Quinn Plowman RN,BSN, [place] Telephonic  [phone]

## 2018-08-29 ENCOUNTER — Ambulatory Visit: Payer: Self-pay | Admitting: Nurse Practitioner

## 2018-09-01 ENCOUNTER — Ambulatory Visit: Payer: Self-pay

## 2018-09-01 ENCOUNTER — Other Ambulatory Visit: Payer: Self-pay

## 2018-09-01 NOTE — Patient Outreach (Signed)
[facility]) Care Management  09/01/2018  Olivia Lane [facility] 1935-12-08 [id number]    EMMI-General Discharge RED ON EMMI ALERT Day # 4 Date: 08/21/2018 Red Alert Reason: " Lost interest in things? Yes"   Outreach attempt # 1 to patient. No answer at present and RN CM left HIPAA compliant voicemail message along with contact info.      Plan: Primary assigned RN CM will close case if no response from letter sent to patient.   Enzo [place], RN,BSN,CCM [facility] Telephonic Care Management Coordinator Direct Phone: [phone] Toll Free: [phone] Fax: [phone]

## 2018-09-11 ENCOUNTER — Other Ambulatory Visit: Payer: Self-pay

## 2018-09-11 MED ORDER — AMLODIPINE BESYLATE 10 MG PO TABS: ORAL_TABLET | ORAL | 1 refills | Status: DC

## 2018-09-11 MED ORDER — METOPROLOL TARTRATE 50 MG PO TABS: 50.0000 mg | ORAL_TABLET | Freq: Two times a day (BID) | ORAL | 1 refills | Status: DC

## 2018-09-11 MED ORDER — CLOPIDOGREL BISULFATE 75 MG PO TABS: 75.0000 mg | ORAL_TABLET | Freq: Every day | ORAL | 2 refills | Status: DC

## 2018-09-11 MED ORDER — PANTOPRAZOLE SODIUM 40 MG PO TBEC: 40.0000 mg | DELAYED_RELEASE_TABLET | Freq: Two times a day (BID) | ORAL | 3 refills | Status: DC

## 2018-09-22 ENCOUNTER — Other Ambulatory Visit: Payer: Self-pay

## 2018-09-22 NOTE — Patient Outreach (Signed)
[facility]) Care Management  09/22/2018  L[place] [employer] 1936/03/27 [id number]  EMMI:general discharge red alert Referral date:08/21/18 Referral reason:lost interest in things. Insurance: united health care Day #1  Case Closure:  No response after 3 telephone calls and outreach letter attempt.  PLAN: RNCM will close patient due to being unable to reach.  RNCM will send closure notification to patient's primary MD   Quinn Plowman RN,BSN,CCM [facility] Telephonic  [phone]

## 2018-09-26 ENCOUNTER — Ambulatory Visit: Payer: Self-pay | Admitting: Nurse Practitioner

## 2018-11-20 ENCOUNTER — Other Ambulatory Visit: Payer: Self-pay

## 2018-11-20 ENCOUNTER — Encounter: Payer: Self-pay | Admitting: Nurse Practitioner

## 2018-11-20 ENCOUNTER — Ambulatory Visit (INDEPENDENT_AMBULATORY_CARE_PROVIDER_SITE_OTHER): Payer: Medicare Other | Admitting: Nurse Practitioner

## 2018-11-20 VITALS — Ht 62.5 in | Wt 115.0 lb

## 2018-11-20 DIAGNOSIS — I10 Essential (primary) hypertension: Secondary | ICD-10-CM

## 2018-11-20 DIAGNOSIS — E782 Mixed hyperlipidemia: Secondary | ICD-10-CM

## 2018-11-20 DIAGNOSIS — J449 Chronic obstructive pulmonary disease, unspecified: Secondary | ICD-10-CM | POA: Diagnosis not present

## 2018-11-20 NOTE — Progress Notes (Signed)
[facility] [place], [place] [address]  Internal MEDICINE  Telephone Visit  Patient Name: Olivia Lane  [id number]  [id number]  Date of Service: 12/03/2018  I connected with the patient at 12:04pm by telephone and verified the patients identity using two identifiers.   I discussed the limitations, risks, security and privacy concerns of performing an evaluation and management service by telephone and the availability of in person appointments. I also discussed with the patient that there may be a patient responsible charge related to the service.  The patient expressed understanding and agrees to proceed.    Chief Complaint  Patient presents with  . Telephone Screen    PHONE VISIT [phone]  . Telephone Assessment  . Medical Management of Chronic Issues  . Cough    cough with congestion   . Hypertension  . Hyperlipidemia  . Gastroesophageal Reflux    The patient has been contacted via telephone for follow up visit due to concerns for spread of novel coronavirus. The patient states tat she was hospitalized in February. Had contracted te flu and developed pneumonia. Since then, he has remained fatigued and has mild, congested cough she can't seem to get rid of. She denies chest pain, pressure, or shortness of breath. She states that, overall, she feels good .      Current Medication: Outpatient Encounter Medications as of 11/20/2018  Medication Sig  . amLODipine (NORVASC) 10 MG tablet TAKE ONE TABLET BY MOUTH EVERY NIGHT FORHYPERTENSION  . atorvastatin (LIPITOR) 40 MG tablet TAKE 1 TABLET BY MOUTH AT BEDTIME  . budesonide-formoterol (SYMBICORT) 80-4.5 MCG/ACT inhaler Inhale 1 puff into the lungs 2 (two) times daily.  . clopidogrel (PLAVIX) 75 MG tablet Take 1 tablet (75 mg total) by mouth daily.  . metoprolol tartrate (LOPRESSOR) 50 MG tablet Take 1 tablet (50 mg total) by mouth every 12 (twelve) hours.  . ondansetron (ZOFRAN ODT) 4 MG  disintegrating tablet Take 1 tablet (4 mg total) by mouth every 8 (eight) hours as needed for nausea or vomiting.  . pantoprazole (PROTONIX) 40 MG tablet Take 1 tablet (40 mg total) by mouth 2 (two) times daily.  Marland Kitchen PROAIR HFA 108 (90 Base) MCG/ACT inhaler    No facility-administered encounter medications on file as of 11/20/2018.     Surgical History: Past Surgical History:  Procedure Laterality Date  . APPENDECTOMY  2004  . BYPASS GRAFT  2014  . CATARACT EXTRACTION  [id number]  . COLONOSCOPY  2004    Medical History: Past Medical History:  Diagnosis Date  . Anemia   . Gastro-esophageal reflux   . Hyperlipidemia   . Hypertension     Family History: Family History  Problem Relation Age of Onset  . Hypertension Father   . Coronary artery disease Sister   . Hypertension Sister   . Diabetes Brother   . Hypertension Brother     Social History   Socioeconomic History  . Marital status: Single    Spouse name: Not on file  . Number of children: Not on file  . Years of education: Not on file  . Highest education level: Not on file  Occupational History  . Not on file  Social Needs  . Financial resource strain: Not on file  . Food insecurity:    Worry: Not on file    Inability: Not on file  . Transportation needs:    Medical: Not on file    Non-medical: Not on file  Tobacco Use  .  Smoking status: Former Smoker    Packs/day: 1.00    Years: 40.00    Pack years: 40.00    Types: Cigarettes    Last attempt to quit: 03/24/2013    Years since quitting: 5.6  . Smokeless tobacco: Never Used  Substance and Sexual Activity  . Alcohol use: No  . Drug use: No  . Sexual activity: Not on file  Lifestyle  . Physical activity:    Days per week: Not on file    Minutes per session: Not on file  . Stress: Not on file  Relationships  . Social connections:    Talks on phone: Not on file    Gets together: Not on file    Attends religious service: Not on file    Active member  of club or organization: Not on file    Attends meetings of clubs or organizations: Not on file    Relationship status: Not on file  . Intimate partner violence:    Fear of current or ex partner: Not on file    Emotionally abused: Not on file    Physically abused: Not on file    Forced sexual activity: Not on file  Other Topics Concern  . Not on file  Social History Narrative  . Not on file      Review of Systems  Constitutional: Positive for fatigue. Negative for activity change, chills and fever.  HENT: Negative for congestion, postnasal drip, rhinorrhea, sneezing, sore throat and voice change.   Respiratory: Positive for cough, shortness of breath and wheezing.        Intermittent and chronic  Cardiovascular: Negative for chest pain and palpitations.  Gastrointestinal: Negative for constipation, diarrhea, nausea and vomiting.  Endocrine: Negative for cold intolerance, heat intolerance, polydipsia and polyuria.  Musculoskeletal: Negative for arthralgias, back pain and myalgias.  Skin: Negative for rash.  Allergic/Immunologic: Positive for environmental allergies.  Neurological: Positive for headaches. Negative for dizziness.  Hematological: Negative for adenopathy.  Psychiatric/Behavioral: Negative for dysphoric mood. The patient is not nervous/anxious.     Today's Vitals   11/20/18 1108  Weight: 115 lb (52.2 kg)  Height: 5' 2.5" (1.588 m)   Body mass index is 20.7 kg/m.  Observation/Objective:   The patient is alert and oriented. She is pleasant and answers all questions appropriately. Breathing is non-labored. She is in no acute distress at this time.    Assessment/Plan: 1. Chronic obstructive pulmonary disease, unspecified COPD type ([place]) Overall, well controlled. Continue to use inhalers as prescribed   2. Essential hypertension Stable.   3. Mixed hyperlipidemia contniue atorvastatin as prescribed   General Counseling: Olivia Lane understanding of  the findings of today's phone visit and agrees with plan of treatment. I have discussed any further diagnostic evaluation that may be needed or ordered today. We also reviewed her medications today. she has been encouraged to call the office with any questions or concerns that should arise related to todays visit.  Hypertension Counseling:   The following hypertensive lifestyle modification were recommended and discussed:  1. Limiting alcohol intake to less than 1 oz/day of ethanol:(24 oz of beer or 8 oz of wine or 2 oz of 100-proof whiskey). 2. Take baby ASA 81 mg daily. 3. Importance of regular aerobic exercise and losing weight. 4. Reduce dietary saturated fat and cholesterol intake for overall cardiovascular health. 5. Maintaining adequate dietary potassium, calcium, and magnesium intake. 6. Regular monitoring of the blood pressure. 7. Reduce sodium intake to less than 100  mmol/day (less than 2.3 gm of sodium or less than 6 gm of sodium choride)   This patient was seen by [place] with Dr Lavera Guise as a part of collaborative care agreement  Time spent: 25 Minutes    Dr Lavera Guise Internal medicine

## 2018-12-10 ENCOUNTER — Other Ambulatory Visit: Payer: Self-pay | Admitting: Nurse Practitioner

## 2018-12-10 MED ORDER — METOPROLOL TARTRATE 50 MG PO TABS: 50.0000 mg | ORAL_TABLET | Freq: Two times a day (BID) | ORAL | 1 refills | Status: DC

## 2019-03-23 ENCOUNTER — Ambulatory Visit (INDEPENDENT_AMBULATORY_CARE_PROVIDER_SITE_OTHER): Payer: Medicare Other | Admitting: Nurse Practitioner

## 2019-03-23 ENCOUNTER — Other Ambulatory Visit: Payer: Self-pay

## 2019-03-23 ENCOUNTER — Encounter: Payer: Self-pay | Admitting: Nurse Practitioner

## 2019-03-23 VITALS — BP 146/59 | HR 60 | Temp 97.3°F | Resp 16 | Ht 62.0 in | Wt 110.0 lb

## 2019-03-23 DIAGNOSIS — L209 Atopic dermatitis, unspecified: Secondary | ICD-10-CM

## 2019-03-23 DIAGNOSIS — I2510 Atherosclerotic heart disease of native coronary artery without angina pectoris: Secondary | ICD-10-CM

## 2019-03-23 DIAGNOSIS — Z0001 Encounter for general adult medical examination with abnormal findings: Secondary | ICD-10-CM | POA: Diagnosis not present

## 2019-03-23 DIAGNOSIS — I10 Essential (primary) hypertension: Secondary | ICD-10-CM

## 2019-03-23 DIAGNOSIS — R21 Rash and other nonspecific skin eruption: Secondary | ICD-10-CM

## 2019-03-23 DIAGNOSIS — J449 Chronic obstructive pulmonary disease, unspecified: Secondary | ICD-10-CM | POA: Diagnosis not present

## 2019-03-23 DIAGNOSIS — R300 Dysuria: Secondary | ICD-10-CM

## 2019-03-23 DIAGNOSIS — I2583 Coronary atherosclerosis due to lipid rich plaque: Secondary | ICD-10-CM

## 2019-03-23 MED ORDER — TRIAMCINOLONE ACETONIDE 0.025 % EX CREA: 1.0000 "application " | TOPICAL_CREAM | Freq: Two times a day (BID) | CUTANEOUS | 2 refills | Status: DC

## 2019-03-23 MED ORDER — METHYLPREDNISOLONE 4 MG PO TBPK: ORAL_TABLET | ORAL | 0 refills | Status: DC

## 2019-03-23 NOTE — Progress Notes (Signed)
[facility] [place], [place] [address]  Internal MEDICINE  Office Visit Note  Patient Name: Olivia Lane  [id number]  [id number]  Date of Service: 03/24/2019   Pt is here for routine health maintenance examination   Chief Complaint  Patient presents with  . Annual Exam  . Hypertension  . Hyperlipidemia  . Gastroesophageal Reflux  . Anemia  . [place]    pt has what she believes to be poison oak for over a week  . chest congestion    pt states she has had congestion since she got the flu last year, wants to discuss flu shot     The patient is here for health maintenance exam. Today, she states that she has noted a rash on her right forearm. This quickly spread to left forearm, neck, chest, and right eyelid. She is unsure if she came in contact with poison ivy, poison oak, or something her skin is allergic too. She states that the rash is very itchy. Has actually used a brush to scratch this as it feels itchy down to the bone. She states symptoms started over a week ago. Has gradually become worse.  She is reporting chest congestion. States that this has been going on since she had the flu/pneumonia last year. She is mor congested in the mornings. She was a smoker for about 40 or 50 years and quit 7 years ago. States that she has not been using the symbicort or rescue inhaler. Will use them if needed. She feels as though albuterol works better.    Current Medication: Outpatient Encounter Medications as of 03/23/2019  Medication Sig  . amLODipine (NORVASC) 10 MG tablet TAKE ONE TABLET BY MOUTH EVERY NIGHT FORHYPERTENSION  . atorvastatin (LIPITOR) 40 MG tablet TAKE 1 TABLET BY MOUTH AT BEDTIME  . budesonide-formoterol (SYMBICORT) 80-4.5 MCG/ACT inhaler Inhale 1 puff into the lungs 2 (two) times daily.  . clopidogrel (PLAVIX) 75 MG tablet Take 1 tablet (75 mg total) by mouth daily.  . metoprolol tartrate (LOPRESSOR) 50 MG tablet Take 1 tablet (50  mg total) by mouth every 12 (twelve) hours.  . ondansetron (ZOFRAN ODT) 4 MG disintegrating tablet Take 1 tablet (4 mg total) by mouth every 8 (eight) hours as needed for nausea or vomiting.  . pantoprazole (PROTONIX) 40 MG tablet Take 1 tablet (40 mg total) by mouth 2 (two) times daily.  Marland Kitchen PROAIR HFA 108 (90 Base) MCG/ACT inhaler   . methylPREDNISolone (MEDROL) 4 MG TBPK tablet Take by mouth as directed for 6 days  . triamcinolone (KENALOG) 0.025 % cream Apply 1 application topically 2 (two) times daily.   No facility-administered encounter medications on file as of 03/23/2019.     Surgical History: Past Surgical History:  Procedure Laterality Date  . APPENDECTOMY  2004  . BYPASS GRAFT  2014  . CATARACT EXTRACTION  [id number]  . COLONOSCOPY  2004    Medical History: Past Medical History:  Diagnosis Date  . Anemia   . Gastro-esophageal reflux   . Hyperlipidemia   . Hypertension     Family History: Family History  Problem Relation Age of Onset  . Hypertension Father   . Coronary artery disease Sister   . Hypertension Sister   . Diabetes Brother   . Hypertension Brother       Review of Systems  Constitutional: Positive for fatigue. Negative for activity change, chills and fever.  HENT: Negative for congestion, postnasal drip, rhinorrhea, sneezing, sore throat and  voice change.   Respiratory: Positive for cough, shortness of breath and wheezing.        Intermittent and chronic  Cardiovascular: Negative for chest pain and palpitations.  Gastrointestinal: Negative for constipation, diarrhea, nausea and vomiting.  Endocrine: Negative for cold intolerance, heat intolerance, polydipsia and polyuria.  Genitourinary: Negative for dysuria, frequency and urgency.  Musculoskeletal: Negative for arthralgias, back pain and myalgias.  Skin: Positive for rash.       Very itchy rash on the arms, chest, neck, and face.   Allergic/Immunologic: Positive for environmental allergies.   Neurological: Positive for headaches. Negative for dizziness.  Hematological: Negative for adenopathy.  Psychiatric/Behavioral: Negative for dysphoric mood. The patient is not nervous/anxious.      Today's Vitals   03/23/19 0956  BP: (!) 146/59  Pulse: 60  Resp: 16  Temp: (!) 97.3 F (36.3 C)  SpO2: 97%  Weight: 110 lb (49.9 kg)  Height: 5\' 2"  (1.575 m)   Body mass index is 20.12 kg/m.  Physical Exam Vitals signs and nursing note reviewed.  Constitutional:      Appearance: Normal appearance. She is well-developed.  HENT:     Head: Normocephalic and atraumatic.     Nose: Nose normal.  Eyes:     Conjunctiva/sclera: Conjunctivae normal.     Pupils: Pupils are equal, round, and reactive to light.  Neck:     Musculoskeletal: Normal range of motion and neck supple.     Thyroid: No thyromegaly.     Vascular: No carotid bruit or JVD.     Trachea: No tracheal deviation.  Cardiovascular:     Rate and Rhythm: Normal rate and regular rhythm.     Pulses: Normal pulses.     Heart sounds: Normal heart sounds.  Pulmonary:     Effort: Pulmonary effort is normal. No respiratory distress.     Breath sounds: Normal breath sounds. No wheezing.  Chest:     Breasts:        Right: Normal. No swelling, bleeding, inverted nipple, mass, nipple discharge, skin change or tenderness.        Left: Normal. No bleeding, inverted nipple, mass, nipple discharge, skin change or tenderness.  Abdominal:     General: Bowel sounds are normal.     Palpations: Abdomen is soft.     Tenderness: There is no abdominal tenderness.  Musculoskeletal: Normal range of motion.  Lymphadenopathy:     Cervical: No cervical adenopathy.  Skin:    General: Skin is warm and dry.     Capillary Refill: Capillary refill takes 2 to 3 seconds.  Neurological:     Mental Status: She is alert and oriented to person, place, and time.     Cranial Nerves: No cranial nerve deficit.     Deep Tendon Reflexes: Reflexes normal.   Psychiatric:        Behavior: Behavior normal.        Thought Content: Thought content normal.        Judgment: Judgment normal.    Depression screen [facility] 2/9 11/20/2018 07/18/2018 03/18/2018 02/18/2018 11/15/2017  Decreased Interest 0 0 0 0 0  Down, Depressed, Hopeless 0 0 0 0 0  PHQ - 2 Score 0 0 0 0 0    Functional Status Survey: Is the patient deaf or have difficulty hearing?: No Does the patient have difficulty seeing, even when wearing glasses/contacts?: No Does the patient have difficulty concentrating, remembering, or making decisions?: No Does the patient have difficulty walking or climbing  stairs?: No Does the patient have difficulty doing errands alone such as visiting a doctor's office or shopping?: No  MMSE - Mini Mental State Exam 03/18/2018  Orientation to time 5  Orientation to Place 5  Registration 3  Attention/ Calculation 5  Recall 3  Language- name 2 objects 2  Language- repeat 1  Language- follow 3 step command 3  Language- read & follow direction 1  Write a sentence 0  Copy design 1  Total score 29    Fall Risk  11/20/2018 07/18/2018 03/18/2018 02/18/2018 11/15/2017  Falls in the past year? 0 0 No No No      LABS: Recent Results (from the past 2160 hour(s))  UA/M w/rflx Culture, Routine     Status: None   Collection Time: 03/23/19  9:57 AM   Specimen: Urine   URINE  Result Value Ref Range   Specific Gravity, UA 1.019 1.005 - 1.030   pH, UA 5.0 5.0 - 7.5   Color, UA Yellow Yellow   Appearance Ur Clear Clear   Leukocytes,UA Negative Negative   Protein,UA Trace Negative/Trace   Glucose, UA Negative Negative   Ketones, UA Negative Negative   RBC, UA Negative Negative   Bilirubin, UA Negative Negative   Urobilinogen, Ur 0.2 0.2 - 1.0 mg/dL   Nitrite, UA Negative Negative   Microscopic Examination Comment     Comment: Microscopic follows if indicated.   Microscopic Examination See below:     Comment: Microscopic was indicated and was performed.    Urinalysis Reflex Comment     Comment: This specimen will not reflex to a Urine Culture.  Microscopic Examination     Status: Abnormal   Collection Time: 03/23/19  9:57 AM   URINE  Result Value Ref Range   WBC, UA 0-5 0 - 5 /hpf   RBC 0-2 0 - 2 /hpf   Epithelial Cells (non renal) 0-10 0 - 10 /hpf   Casts Present (A) None seen /lpf   Cast Type Hyaline casts N/A   Mucus, UA Present Not Estab.   Bacteria, UA Few None seen/Few    Assessment/Plan: 1. Encounter for general adult medical examination with abnormal findings Annual health maintenance exam today  2. Atopic dermatitis, unspecified type May apply triamcinolone cream to affected areas twice daily as needed to help with itching and irritation. Add medrol dose pack. Take as directed for 6 days. Recommend use of OTC benadryl and pepcid to help with acute symptoms.  - methylPREDNISolone (MEDROL) 4 MG TBPK tablet; Take by mouth as directed for 6 days  Dispense: 21 tablet; Refill: 0 - triamcinolone (KENALOG) 0.025 % cream; Apply 1 application topically 2 (two) times daily.  Dispense: 80 g; Refill: 2  3. Rash and nonspecific skin eruption May apply triamcinolone cream to affected areas twice daily as needed to help with itching and irritation. Add medrol dose pack. Take as directed for 6 days. Recommend use of OTC benadryl and pepcid to help with acute symptoms.  - methylPREDNISolone (MEDROL) 4 MG TBPK tablet; Take by mouth as directed for 6 days  Dispense: 21 tablet; Refill: 0  4. Essential hypertension Stable.   5. Chronic obstructive pulmonary disease, unspecified COPD type ([place]) Continue inhalers as prescribed   6. Coronary artery disease due to lipid rich plaque Stable.   7. Dysuria - UA/M w/rflx Culture, Routine  General Counseling: maliha sado understanding of the findings of todays visit and agrees with plan of treatment. I have discussed any further diagnostic  evaluation that may be needed or ordered today. We also  reviewed her medications today. she has been encouraged to call the office with any questions or concerns that should arise related to todays visit.    Counseling:  Hypertension Counseling:   The following hypertensive lifestyle modification were recommended and discussed:  1. Limiting alcohol intake to less than 1 oz/day of ethanol:(24 oz of beer or 8 oz of wine or 2 oz of 100-proof whiskey). 2. Take baby ASA 81 mg daily. 3. Importance of regular aerobic exercise and losing weight. 4. Reduce dietary saturated fat and cholesterol intake for overall cardiovascular health. 5. Maintaining adequate dietary potassium, calcium, and magnesium intake. 6. Regular monitoring of the blood pressure. 7. Reduce sodium intake to less than 100 mmol/day (less than 2.3 gm of sodium or less than 6 gm of sodium choride)   This patient was seen by [place] with Dr Lavera Guise as a part of collaborative care agreement  Orders Placed This Encounter  Procedures  . Microscopic Examination  . UA/M w/rflx Culture, Routine    Meds ordered this encounter  Medications  . methylPREDNISolone (MEDROL) 4 MG TBPK tablet    Sig: Take by mouth as directed for 6 days    Dispense:  21 tablet    Refill:  0    Order Specific Question:   Supervising Provider    Answer:   Lavera Guise [place]  . triamcinolone (KENALOG) 0.025 % cream    Sig: Apply 1 application topically 2 (two) times daily.    Dispense:  80 g    Refill:  2    Order Specific Question:   Supervising Provider    Answer:   Lavera Guise [[id number]]    Time spent: [place], MD  Internal Medicine

## 2019-03-24 DIAGNOSIS — R300 Dysuria: Secondary | ICD-10-CM | POA: Insufficient documentation

## 2019-03-24 DIAGNOSIS — Z0001 Encounter for general adult medical examination with abnormal findings: Secondary | ICD-10-CM | POA: Insufficient documentation

## 2019-03-24 DIAGNOSIS — R21 Rash and other nonspecific skin eruption: Secondary | ICD-10-CM | POA: Insufficient documentation

## 2019-03-24 DIAGNOSIS — L209 Atopic dermatitis, unspecified: Secondary | ICD-10-CM | POA: Insufficient documentation

## 2019-03-24 LAB — MICROSCOPIC EXAMINATION

## 2019-03-24 LAB — UA/M W/RFLX CULTURE, ROUTINE
Bilirubin, UA: NEGATIVE
Glucose, UA: NEGATIVE
Ketones, UA: NEGATIVE
Leukocytes,UA: NEGATIVE
Nitrite, UA: NEGATIVE
RBC, UA: NEGATIVE
Specific Gravity, UA: 1.019 (ref 1.005–1.030)
Urobilinogen, Ur: 0.2 mg/dL (ref 0.2–1.0)
pH, UA: 5 (ref 5.0–7.5)

## 2019-04-15 ENCOUNTER — Ambulatory Visit (INDEPENDENT_AMBULATORY_CARE_PROVIDER_SITE_OTHER): Payer: Medicare Other | Admitting: Adult Health

## 2019-04-15 ENCOUNTER — Other Ambulatory Visit: Payer: Self-pay

## 2019-04-15 VITALS — Ht 62.5 in | Wt 115.0 lb

## 2019-04-15 DIAGNOSIS — L209 Atopic dermatitis, unspecified: Secondary | ICD-10-CM

## 2019-04-15 DIAGNOSIS — J45909 Unspecified asthma, uncomplicated: Secondary | ICD-10-CM | POA: Diagnosis not present

## 2019-04-15 NOTE — Progress Notes (Signed)
[facility] [place],  [address]  Internal MEDICINE  Telephone Visit  Patient Name: Olivia Lane  [id number]  [id number]  Date of Service: 04/15/2019  I connected with the patient at 352 by telephone and verified the patients identity using two identifiers.   I discussed the limitations, risks, security and privacy concerns of performing an evaluation and management service by telephone and the availability of in person appointments. I also discussed with the patient that there may be a patient responsible charge related to the service.  The patient expressed understanding and agrees to proceed.    Chief Complaint  Patient presents with  . Telephone Assessment  . Telephone Screen  . Sinusitis  . Sore Throat  . Cough    HPI  PT seen via telephone. She reports she had coughing, nose running and sore throat.  She reports she is feeling better.    Current Medication: Outpatient Encounter Medications as of 04/15/2019  Medication Sig  . amLODipine (NORVASC) 10 MG tablet TAKE ONE TABLET BY MOUTH EVERY NIGHT FORHYPERTENSION  . atorvastatin (LIPITOR) 40 MG tablet TAKE 1 TABLET BY MOUTH AT BEDTIME  . budesonide-formoterol (SYMBICORT) 80-4.5 MCG/ACT inhaler Inhale 1 puff into the lungs 2 (two) times daily.  . clopidogrel (PLAVIX) 75 MG tablet Take 1 tablet (75 mg total) by mouth daily.  . methylPREDNISolone (MEDROL) 4 MG TBPK tablet Take by mouth as directed for 6 days  . metoprolol tartrate (LOPRESSOR) 50 MG tablet Take 1 tablet (50 mg total) by mouth every 12 (twelve) hours.  . ondansetron (ZOFRAN ODT) 4 MG disintegrating tablet Take 1 tablet (4 mg total) by mouth every 8 (eight) hours as needed for nausea or vomiting.  . pantoprazole (PROTONIX) 40 MG tablet Take 1 tablet (40 mg total) by mouth 2 (two) times daily.  Marland Kitchen PROAIR HFA 108 (90 Base) MCG/ACT inhaler   . triamcinolone (KENALOG) 0.025 % cream Apply 1 application topically 2 (two) times  daily.   No facility-administered encounter medications on file as of 04/15/2019.     Surgical History: Past Surgical History:  Procedure Laterality Date  . APPENDECTOMY  2004  . BYPASS GRAFT  2014  . CATARACT EXTRACTION  [id number]  . COLONOSCOPY  2004    Medical History: Past Medical History:  Diagnosis Date  . Anemia   . Gastro-esophageal reflux   . Hyperlipidemia   . Hypertension     Family History: Family History  Problem Relation Age of Onset  . Hypertension Father   . Coronary artery disease Sister   . Hypertension Sister   . Diabetes Brother   . Hypertension Brother     Social History   Socioeconomic History  . Marital status: Single    Spouse name: Not on file  . Number of children: Not on file  . Years of education: Not on file  . Highest education level: Not on file  Occupational History  . Not on file  Social Needs  . Financial resource strain: Not on file  . Food insecurity    Worry: Not on file    Inability: Not on file  . Transportation needs    Medical: Not on file    Non-medical: Not on file  Tobacco Use  . Smoking status: Former Smoker    Packs/day: 1.00    Years: 40.00    Pack years: 40.00    Types: Cigarettes    Quit date: 03/24/2013    Years since quitting: 6.0  .  Smokeless tobacco: Never Used  Substance and Sexual Activity  . Alcohol use: No  . Drug use: No  . Sexual activity: Not on file  Lifestyle  . Physical activity    Days per week: Not on file    Minutes per session: Not on file  . Stress: Not on file  Relationships  . Social Herbalist on phone: Not on file    Gets together: Not on file    Attends religious service: Not on file    Active member of club or organization: Not on file    Attends meetings of clubs or organizations: Not on file    Relationship status: Not on file  . Intimate partner violence    Fear of current or ex partner: Not on file    Emotionally abused: Not on file    Physically  abused: Not on file    Forced sexual activity: Not on file  Other Topics Concern  . Not on file  Social History Narrative  . Not on file      Review of Systems  Vital Signs: Ht 5' 2.5" (1.588 m)   Wt 115 lb (52.2 kg)   BMI 20.70 kg/m    Observation/Objective:  Well sounding, NAD noted.   Assessment/Plan: 1. Asthma due to environmental allergies Instructed patient to start taking OTC allergy medication like Claritin, or allegra. Also use Flonase 2 sprays in each nostril twice a day. Call office if symptoms to do not get better, or worsen. Would consider antibiotics.    2. Atopic dermatitis, unspecified type Intermittent flares, continue present management.   General Counseling: tru sainz understanding of the findings of today's phone visit and agrees with plan of treatment. I have discussed any further diagnostic evaluation that may be needed or ordered today. We also reviewed her medications today. she has been encouraged to call the office with any questions or concerns that should arise related to todays visit.    No orders of the defined types were placed in this encounter.   No orders of the defined types were placed in this encounter.   Time spent: [place] AGNP-C Internal medicine

## 2019-04-21 ENCOUNTER — Other Ambulatory Visit: Payer: Self-pay | Admitting: Nurse Practitioner

## 2019-04-21 MED ORDER — PANTOPRAZOLE SODIUM 40 MG PO TBEC: 40.0000 mg | DELAYED_RELEASE_TABLET | Freq: Two times a day (BID) | ORAL | 3 refills | Status: DC

## 2019-05-14 ENCOUNTER — Other Ambulatory Visit: Payer: Self-pay

## 2019-05-14 MED ORDER — METOPROLOL TARTRATE 50 MG PO TABS: 50.0000 mg | ORAL_TABLET | Freq: Two times a day (BID) | ORAL | 1 refills | Status: DC

## 2019-05-14 MED ORDER — AMLODIPINE BESYLATE 10 MG PO TABS: ORAL_TABLET | ORAL | 1 refills | Status: DC

## 2019-05-21 ENCOUNTER — Other Ambulatory Visit: Payer: Self-pay

## 2019-05-21 DIAGNOSIS — Z20822 Contact with and (suspected) exposure to covid-19: Secondary | ICD-10-CM

## 2019-05-24 LAB — NOVEL CORONAVIRUS, NAA: SARS-CoV-2, NAA: NOT DETECTED

## 2019-06-19 ENCOUNTER — Encounter: Payer: Self-pay | Admitting: Nurse Practitioner

## 2019-06-19 ENCOUNTER — Other Ambulatory Visit: Payer: Self-pay

## 2019-06-19 ENCOUNTER — Ambulatory Visit (INDEPENDENT_AMBULATORY_CARE_PROVIDER_SITE_OTHER): Payer: Medicare Other | Admitting: Nurse Practitioner

## 2019-06-19 VITALS — BP 130/58 | HR 89 | Temp 96.9°F | Resp 16 | Ht 62.5 in | Wt 108.4 lb

## 2019-06-19 DIAGNOSIS — B372 Candidiasis of skin and nail: Secondary | ICD-10-CM | POA: Diagnosis not present

## 2019-06-19 DIAGNOSIS — L209 Atopic dermatitis, unspecified: Secondary | ICD-10-CM

## 2019-06-19 DIAGNOSIS — R21 Rash and other nonspecific skin eruption: Secondary | ICD-10-CM

## 2019-06-19 MED ORDER — CLOTRIMAZOLE-BETAMETHASONE 1-0.05 % EX CREA: TOPICAL_CREAM | CUTANEOUS | 1 refills | Status: DC

## 2019-06-19 MED ORDER — TRIAMCINOLONE ACETONIDE 0.5 % EX OINT: 1.0000 "application " | TOPICAL_OINTMENT | Freq: Two times a day (BID) | CUTANEOUS | 0 refills | Status: DC

## 2019-06-19 MED ORDER — METHYLPREDNISOLONE 4 MG PO TBPK: ORAL_TABLET | ORAL | 0 refills | Status: DC

## 2019-06-19 NOTE — Progress Notes (Signed)
[facility] [place], [place] [address]  Internal MEDICINE  Office Visit Note  Patient Name: Olivia Lane  [id number]  [id number]  Date of Service: 06/19/2019  Chief Complaint  Patient presents with  . Rash    breaking out on chest , back , itching , been going on for over a month      The patient presents for acute visit. She has developed a rash. Present on her arms, back, and chest. Has a dried out area on the left side of her chin. She states that rash is very itchy. She was treated for similar symptoms in September. Was put on a medrol dose pack for six days and triamcinolone cream as needed. She states that she thought it was getting better, but has now, been getting worse. She denies using new lotions, detergents, fabric softeners, or beauty products. She also has area under the breasts, along the bra line which is red, itchy, and irritated.   Pt is here for a sick visit.     Current Medication:  Outpatient Encounter Medications as of 06/19/2019  Medication Sig  . amLODipine (NORVASC) 10 MG tablet TAKE ONE TABLET BY MOUTH EVERY NIGHT FORHYPERTENSION  . atorvastatin (LIPITOR) 40 MG tablet TAKE 1 TABLET BY MOUTH AT BEDTIME  . budesonide-formoterol (SYMBICORT) 80-4.5 MCG/ACT inhaler Inhale 1 puff into the lungs 2 (two) times daily.  . clopidogrel (PLAVIX) 75 MG tablet Take 1 tablet (75 mg total) by mouth daily.  . metoprolol tartrate (LOPRESSOR) 50 MG tablet Take 1 tablet (50 mg total) by mouth every 12 (twelve) hours.  . ondansetron (ZOFRAN ODT) 4 MG disintegrating tablet Take 1 tablet (4 mg total) by mouth every 8 (eight) hours as needed for nausea or vomiting.  . pantoprazole (PROTONIX) 40 MG tablet Take 1 tablet (40 mg total) by mouth 2 (two) times daily.  Marland Kitchen PROAIR HFA 108 (90 Base) MCG/ACT inhaler   . [DISCONTINUED] triamcinolone (KENALOG) 0.025 % cream Apply 1 application topically 2 (two) times daily.  . clotrimazole-betamethasone  (LOTRISONE) cream Apply to skin of upper abdomen twice daily as needed  . methylPREDNISolone (MEDROL) 4 MG TBPK tablet Take by mouth as directed for 6 days  . triamcinolone ointment (KENALOG) 0.5 % Apply 1 application topically 2 (two) times daily.  . [DISCONTINUED] methylPREDNISolone (MEDROL) 4 MG TBPK tablet Take by mouth as directed for 6 days (Patient not taking: Reported on 06/19/2019)   No facility-administered encounter medications on file as of 06/19/2019.      Medical History: Past Medical History:  Diagnosis Date  . Anemia   . Gastro-esophageal reflux   . Hyperlipidemia   . Hypertension      Today's Vitals   06/19/19 1356  BP: (!) 130/58  Pulse: 89  Resp: 16  Temp: (!) 96.9 F (36.1 C)  SpO2: 96%  Weight: 108 lb 6.4 oz (49.2 kg)  Height: 5' 2.5" (1.588 m)   Body mass index is 19.51 kg/m.  Review of Systems  Constitutional: Negative for chills, fatigue and unexpected weight change.  HENT: Negative for congestion, postnasal drip, rhinorrhea, sneezing and sore throat.   Respiratory: Negative for cough, chest tightness and shortness of breath.   Cardiovascular: Negative for chest pain and palpitations.  Gastrointestinal: Negative for abdominal pain, constipation, diarrhea, nausea and vomiting.  Musculoskeletal: Negative for arthralgias, back pain, joint swelling and neck pain.  Skin: Positive for rash.       Itchy, red, irritated rash under the breasts  Very dry and itchy skin all over the body  Allergic/Immunologic: Negative for environmental allergies.  Neurological: Negative for tremors, numbness and headaches.  Hematological: Negative for adenopathy. Does not bruise/bleed easily.  Psychiatric/Behavioral: Negative for behavioral problems (Depression), sleep disturbance and suicidal ideas. The patient is nervous/anxious.     Physical Exam Vitals and nursing note reviewed.  Constitutional:      General: She is not in acute distress.    Appearance: Normal  appearance. She is well-developed. She is not diaphoretic.  HENT:     Head: Normocephalic and atraumatic.     Mouth/Throat:     Pharynx: No oropharyngeal exudate.  Eyes:     Pupils: Pupils are equal, round, and reactive to light.  Neck:     Thyroid: No thyromegaly.     Vascular: No JVD.     Trachea: No tracheal deviation.  Cardiovascular:     Rate and Rhythm: Normal rate and regular rhythm.     Heart sounds: Normal heart sounds. No murmur. No friction rub. No gallop.   Pulmonary:     Effort: Pulmonary effort is normal. No respiratory distress.     Breath sounds: Normal breath sounds. No wheezing or rales.  Chest:     Chest wall: No tenderness.  Abdominal:     Palpations: Abdomen is soft.  Musculoskeletal:        General: Normal range of motion.     Cervical back: Normal range of motion and neck supple.  Lymphadenopathy:     Cervical: No cervical adenopathy.  Skin:    General: Skin is warm and dry.     Comments: 1. Very red, irritated rash under the breasts. Skin intact with no drainage present.  2. Dry, itchy, and flaky skin al over the body. No rash present.   Neurological:     Mental Status: She is alert and oriented to person, place, and time.     Cranial Nerves: No cranial nerve deficit.  Psychiatric:        Behavior: Behavior normal.        Thought Content: Thought content normal.        Judgment: Judgment normal.    Assessment/Plan:  1. Cutaneous candidiasis Use lotrisone cream under the breasts twice daily as needed  - clotrimazole-betamethasone (LOTRISONE) cream; Apply to skin of upper abdomen twice daily as needed  Dispense: 45 g; Refill: 1  2. Atopic dermatitis, unspecified type Due to dry skin. Add medrol dose pack. Take as directed for 6 days. Increase strength of triamcinolone cream to 0.05% cream and apply twice daily. Recommended she use Eucerin calming cream or other product like this to soothe dry and itchy skin.  - methylPREDNISolone (MEDROL) 4 MG TBPK  tablet; Take by mouth as directed for 6 days  Dispense: 21 tablet; Refill: 0 - triamcinolone ointment (KENALOG) 0.5 %; Apply 1 application topically 2 (two) times daily.  Dispense: 30 g; Refill: 0  3. Rash and nonspecific skin eruption Due to dry skin. Add medrol dose pack. Take as directed for 6 days. Increase strength of triamcinolone cream to 0.05% cream and apply twice daily. Recommended she use Eucerin calming cream or other product like this to soothe dry and itchy skin.  - methylPREDNISolone (MEDROL) 4 MG TBPK tablet; Take by mouth as directed for 6 days  Dispense: 21 tablet; Refill: 0 General Counseling: launi tomasko understanding of the findings of todays visit and agrees with plan of treatment. I have discussed any further diagnostic evaluation that may  be needed or ordered today. We also reviewed her medications today. she has been encouraged to call the office with any questions or concerns that should arise related to todays visit.    Counseling:  This patient was seen by [place] with Dr Lavera Guise as a part of collaborative care agreement  Meds ordered this encounter  Medications  . methylPREDNISolone (MEDROL) 4 MG TBPK tablet    Sig: Take by mouth as directed for 6 days    Dispense:  21 tablet    Refill:  0    Order Specific Question:   Supervising Provider    Answer:   Lavera Guise [place]  . triamcinolone ointment (KENALOG) 0.5 %    Sig: Apply 1 application topically 2 (two) times daily.    Dispense:  30 g    Refill:  0    Order Specific Question:   Supervising Provider    Answer:   Lavera Guise [id number]  . clotrimazole-betamethasone (LOTRISONE) cream    Sig: Apply to skin of upper abdomen twice daily as needed    Dispense:  45 g    Refill:  1    Order Specific Question:   Supervising Provider    Answer:   Lavera Guise [id number]    Time spent: 15 Minutes

## 2019-07-23 DIAGNOSIS — L309 Dermatitis, unspecified: Secondary | ICD-10-CM | POA: Diagnosis not present

## 2019-07-23 DIAGNOSIS — L308 Other specified dermatitis: Secondary | ICD-10-CM | POA: Diagnosis not present

## 2019-07-23 DIAGNOSIS — R21 Rash and other nonspecific skin eruption: Secondary | ICD-10-CM | POA: Diagnosis not present

## 2019-07-24 ENCOUNTER — Telehealth: Payer: Self-pay

## 2019-07-24 NOTE — Telephone Encounter (Signed)
Confirmed telephone visit with patient.klh 

## 2019-07-28 ENCOUNTER — Encounter: Payer: Self-pay | Admitting: Adult Health

## 2019-07-28 ENCOUNTER — Ambulatory Visit (INDEPENDENT_AMBULATORY_CARE_PROVIDER_SITE_OTHER): Payer: Medicare Other | Admitting: Adult Health

## 2019-07-28 VITALS — Ht 62.5 in

## 2019-07-28 DIAGNOSIS — J45909 Unspecified asthma, uncomplicated: Secondary | ICD-10-CM

## 2019-07-28 DIAGNOSIS — L308 Other specified dermatitis: Secondary | ICD-10-CM | POA: Diagnosis not present

## 2019-07-28 DIAGNOSIS — J449 Chronic obstructive pulmonary disease, unspecified: Secondary | ICD-10-CM

## 2019-07-28 DIAGNOSIS — I10 Essential (primary) hypertension: Secondary | ICD-10-CM

## 2019-07-28 DIAGNOSIS — K219 Gastro-esophageal reflux disease without esophagitis: Secondary | ICD-10-CM | POA: Diagnosis not present

## 2019-07-28 MED ORDER — BUDESONIDE-FORMOTEROL FUMARATE 80-4.5 MCG/ACT IN AERO: 1.0000 | INHALATION_SPRAY | Freq: Two times a day (BID) | RESPIRATORY_TRACT | 6 refills | Status: DC

## 2019-07-28 NOTE — Progress Notes (Signed)
[facility] [place], [place] [address]  Internal MEDICINE  Telephone Visit  Patient Name: Olivia Lane  [id number]  [id number]  Date of Service: 07/28/2019  I connected with the patient at 1104 by telephone and verified the patients identity using two identifiers.   I discussed the limitations, risks, security and privacy concerns of performing an evaluation and management service by telephone and the availability of in person appointments. I also discussed with the patient that there may be a patient responsible charge related to the service.  The patient expressed understanding and agrees to proceed.    Chief Complaint  Patient presents with  . Telephone Assessment  . Telephone Screen    HPI  PT is seen via telephone. Pt is seen for routine follow up on htn, copd and asthma.  She denies any issues at this time. Her BP is wnl, Denies Chest pain, Shortness of breath, palpitations, headache, or blurred vision.  She reports her breathing has been at baseline, and she is using her Symbicort as prescribed.    Current Medication: Outpatient Encounter Medications as of 07/28/2019  Medication Sig  . amLODipine (NORVASC) 10 MG tablet TAKE ONE TABLET BY MOUTH EVERY NIGHT FORHYPERTENSION  . atorvastatin (LIPITOR) 40 MG tablet TAKE 1 TABLET BY MOUTH AT BEDTIME  . budesonide-formoterol (SYMBICORT) 80-4.5 MCG/ACT inhaler Inhale 1 puff into the lungs 2 (two) times daily.  . clopidogrel (PLAVIX) 75 MG tablet Take 1 tablet (75 mg total) by mouth daily.  . clotrimazole-betamethasone (LOTRISONE) cream Apply to skin of upper abdomen twice daily as needed  . metoprolol tartrate (LOPRESSOR) 50 MG tablet Take 1 tablet (50 mg total) by mouth every 12 (twelve) hours.  . ondansetron (ZOFRAN ODT) 4 MG disintegrating tablet Take 1 tablet (4 mg total) by mouth every 8 (eight) hours as needed for nausea or vomiting.  . pantoprazole (PROTONIX) 40 MG tablet Take 1 tablet (40 mg  total) by mouth 2 (two) times daily.  Marland Kitchen PROAIR HFA 108 (90 Base) MCG/ACT inhaler   . triamcinolone ointment (KENALOG) 0.5 % Apply 1 application topically 2 (two) times daily.  . [DISCONTINUED] budesonide-formoterol (SYMBICORT) 80-4.5 MCG/ACT inhaler Inhale 1 puff into the lungs 2 (two) times daily.  . [DISCONTINUED] methylPREDNISolone (MEDROL) 4 MG TBPK tablet Take by mouth as directed for 6 days (Patient not taking: Reported on 07/28/2019)   No facility-administered encounter medications on file as of 07/28/2019.    Surgical History: Past Surgical History:  Procedure Laterality Date  . APPENDECTOMY  2004  . BYPASS GRAFT  2014  . CATARACT EXTRACTION  [id number]  . COLONOSCOPY  2004    Medical History: Past Medical History:  Diagnosis Date  . Anemia   . Gastro-esophageal reflux   . Hyperlipidemia   . Hypertension     Family History: Family History  Problem Relation Age of Onset  . Hypertension Father   . Coronary artery disease Sister   . Hypertension Sister   . Diabetes Brother   . Hypertension Brother     Social History   Socioeconomic History  . Marital status: Single    Spouse name: Not on file  . Number of children: Not on file  . Years of education: Not on file  . Highest education level: Not on file  Occupational History  . Not on file  Tobacco Use  . Smoking status: Former Smoker    Packs/day: 1.00    Years: 40.00    Pack years: 40.00  Types: Cigarettes    Quit date: 03/24/2013    Years since quitting: 6.3  . Smokeless tobacco: Never Used  Substance and Sexual Activity  . Alcohol use: No  . Drug use: No  . Sexual activity: Not on file  Other Topics Concern  . Not on file  Social History Narrative  . Not on file   Social Determinants of Health   Financial Resource Strain:   . Difficulty of Paying Living Expenses: Not on file  Food Insecurity:   . Worried About Charity fundraiser in the Last Year: Not on file  . Ran Out of Food in the Last  Year: Not on file  Transportation Needs:   . Lack of Transportation (Medical): Not on file  . Lack of Transportation (Non-Medical): Not on file  Physical Activity:   . Days of Exercise per Week: Not on file  . Minutes of Exercise per Session: Not on file  Stress:   . Feeling of Stress : Not on file  Social Connections:   . Frequency of Communication with Friends and Family: Not on file  . Frequency of Social Gatherings with Friends and Family: Not on file  . Attends Religious Services: Not on file  . Active Member of Clubs or Organizations: Not on file  . Attends Archivist Meetings: Not on file  . Marital Status: Not on file  Intimate Partner Violence:   . Fear of Current or Ex-Partner: Not on file  . Emotionally Abused: Not on file  . Physically Abused: Not on file  . Sexually Abused: Not on file      Review of Systems  Constitutional: Negative for chills, fatigue and unexpected weight change.  HENT: Negative for congestion, rhinorrhea, sneezing and sore throat.   Eyes: Negative for photophobia, pain and redness.  Respiratory: Negative for cough, chest tightness and shortness of breath.   Cardiovascular: Negative for chest pain and palpitations.  Gastrointestinal: Negative for abdominal pain, constipation, diarrhea, nausea and vomiting.  Endocrine: Negative.   Genitourinary: Negative for dysuria and frequency.  Musculoskeletal: Negative for arthralgias, back pain, joint swelling and neck pain.  Skin: Negative for rash.  Allergic/Immunologic: Negative.   Neurological: Negative for tremors and numbness.  Hematological: Negative for adenopathy. Does not bruise/bleed easily.  Psychiatric/Behavioral: Negative for behavioral problems and sleep disturbance. The patient is not nervous/anxious.     Vital Signs: Ht 5' 2.5" (1.588 m)   BMI 19.51 kg/m    Observation/Objective:  Well sounding, nad noted.   Assessment/Plan: 1. Chronic obstructive pulmonary disease,  unspecified COPD type ([place]) Stable, continue Symbicort as directed.   2. Essential hypertension Controlled, continue present management.  3. Asthma due to environmental allergies Stable, continue to use proair as discussed for rescue.   4. Gastroesophageal reflux disease without esophagitis No recent symptoms.  Continue Protonix as prescribed.   General Counseling: verle pattan understanding of the findings of today's phone visit and agrees with plan of treatment. I have discussed any further diagnostic evaluation that may be needed or ordered today. We also reviewed her medications today. she has been encouraged to call the office with any questions or concerns that should arise related to todays visit.    No orders of the defined types were placed in this encounter.   Meds ordered this encounter  Medications  . budesonide-formoterol (SYMBICORT) 80-4.5 MCG/ACT inhaler    Sig: Inhale 1 puff into the lungs 2 (two) times daily.    Dispense:  1 Inhaler  Refill:  6    Time spent: [place] AGNP-C Internal medicine

## 2019-07-30 DIAGNOSIS — L308 Other specified dermatitis: Secondary | ICD-10-CM | POA: Diagnosis not present

## 2019-08-04 DIAGNOSIS — L209 Atopic dermatitis, unspecified: Secondary | ICD-10-CM | POA: Diagnosis not present

## 2019-08-04 DIAGNOSIS — L258 Unspecified contact dermatitis due to other agents: Secondary | ICD-10-CM | POA: Diagnosis not present

## 2019-09-10 IMAGING — CT CT ABD-PELV W/ CM
2 of 5 series · 16 of 46 positions shown, 18 images · IV contrast (APPLIED)
Comparison: None

CLINICAL DATA: Flu like illness since last week, congestion, body
aches, generalized malaise, nausea, vomiting, diarrhea, history GERD
and hypertension

EXAM:
CT ABDOMEN AND PELVIS WITH CONTRAST
TECHNIQUE: Multidetector CT imaging of the abdomen and pelvis was performed
using the standard protocol following bolus administration of
intravenous contrast. Sagittal and coronal MPR images reconstructed
from axial data set.
CONTRAST:  75mL OMNIPAQUE IOHEXOL 300 MG/ML SOLN IV. No oral
contrast.

[Series 2: axial st · axial · 0.68mm/px · z∈[-459,-99]mm · 13 of 81 slices shown, 15 images]
[im 5/81  soft-tissue]
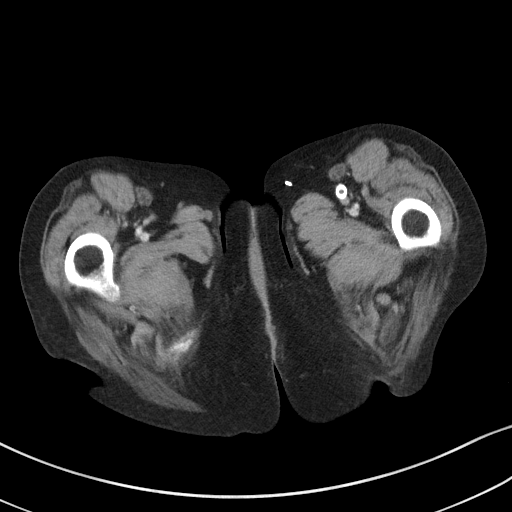
[im 5/81  bone]
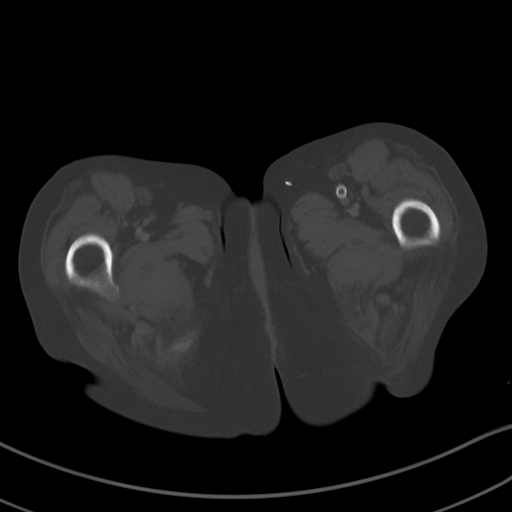
[im 13/81  soft-tissue]
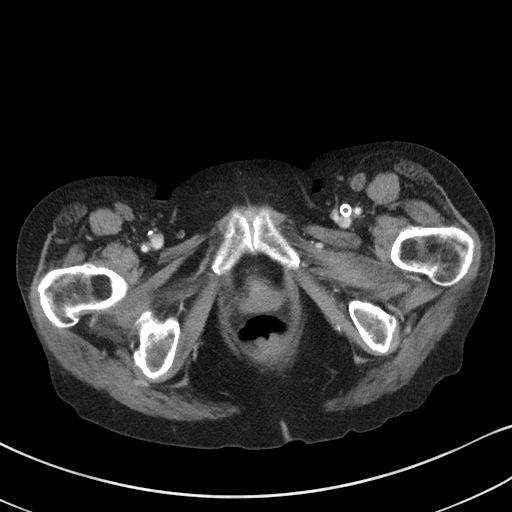
[im 17/81  soft-tissue]
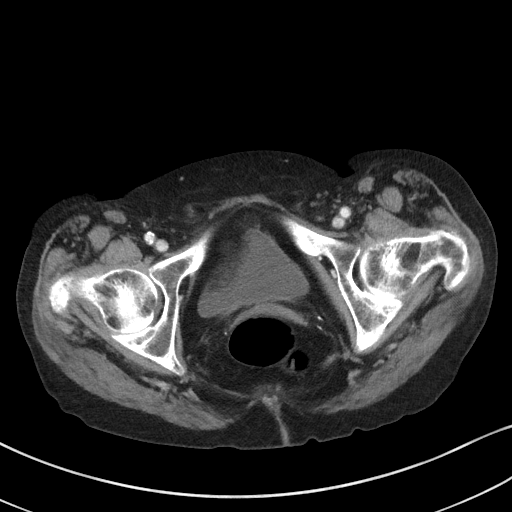
[im 25/81  soft-tissue]
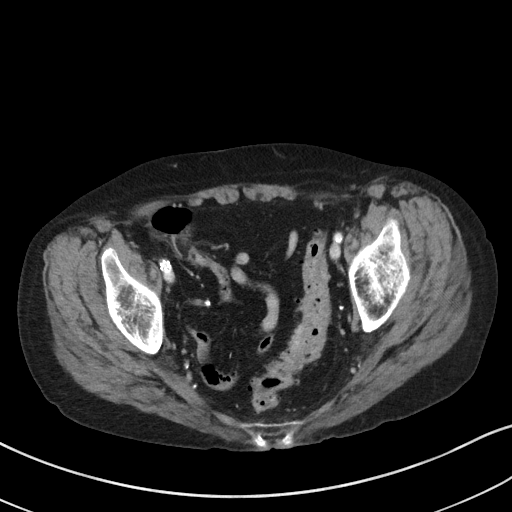
[im 29/81  soft-tissue]
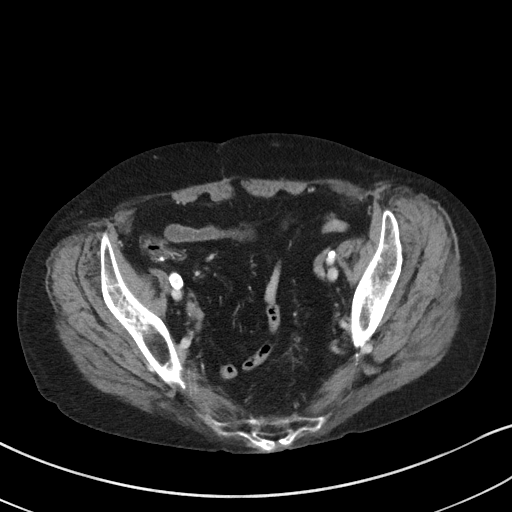
[im 37/81  soft-tissue]
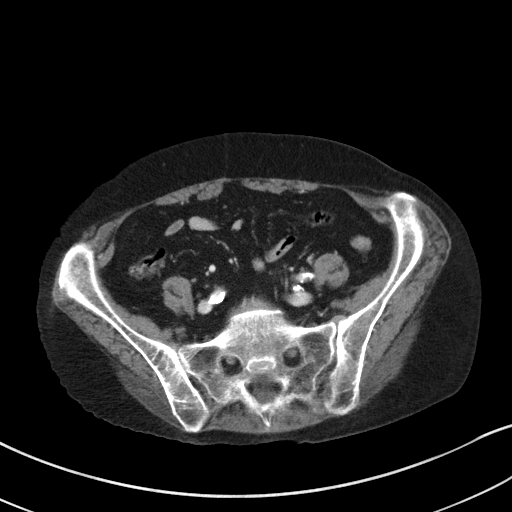
[im 41/81  soft-tissue]
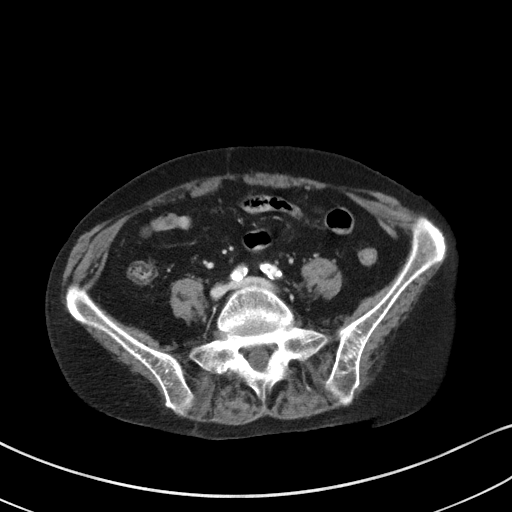
[im 45/81  soft-tissue]
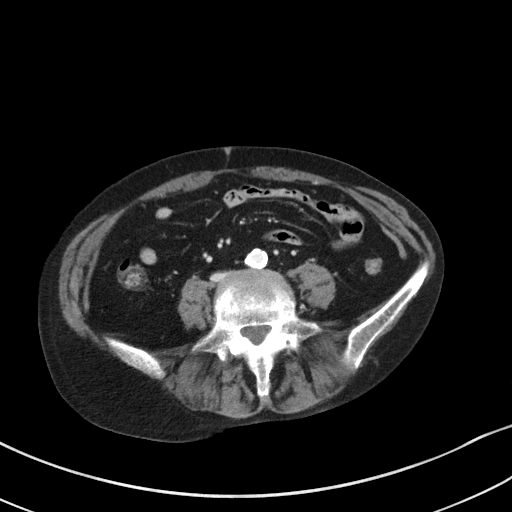
[im 53/81  soft-tissue]
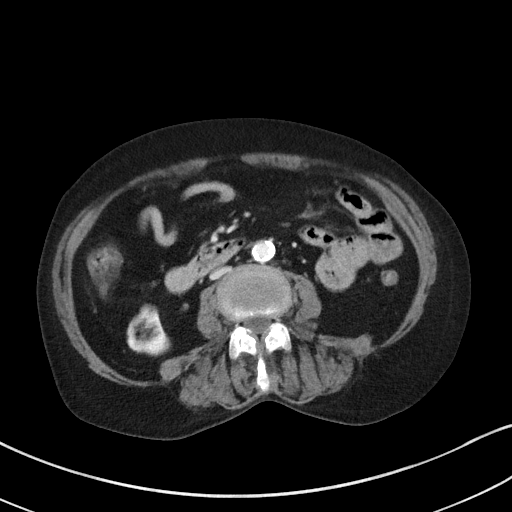
[im 53/81  bone]
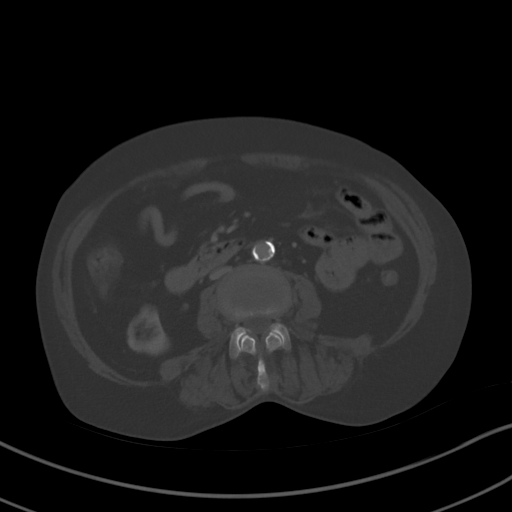
[im 57/81  soft-tissue]
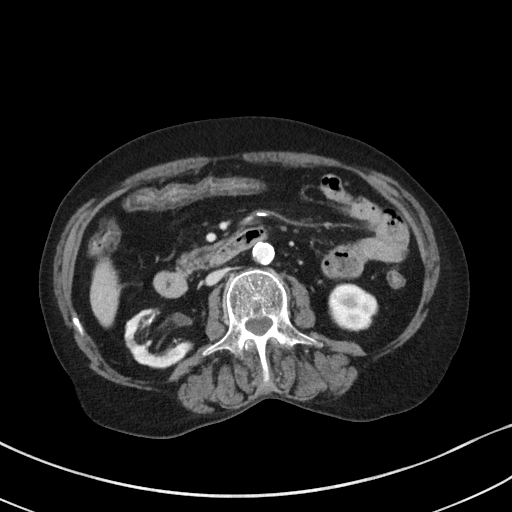
[im 65/81  soft-tissue]
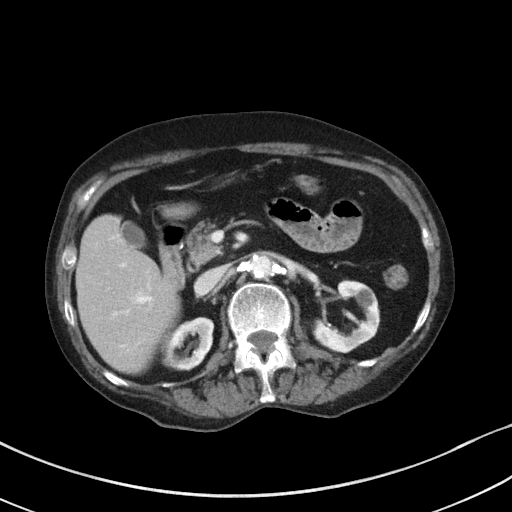
[im 69/81  soft-tissue]
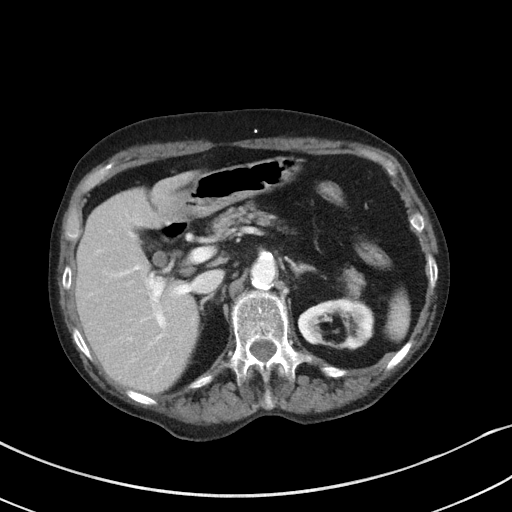
[im 77/81  soft-tissue]
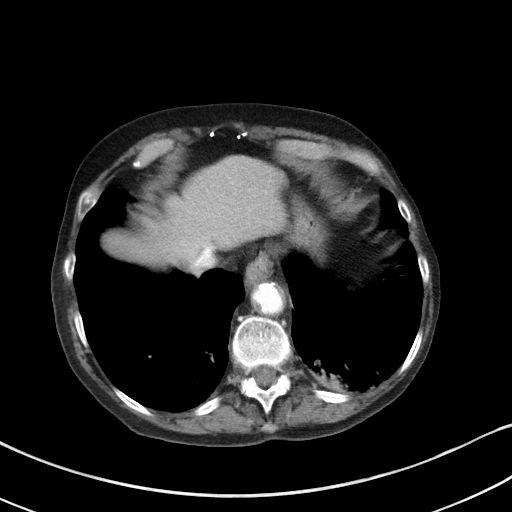

[Series 5: coronal st · coronal · 0.66mm/px · 3 of 68 slices shown]
[im 23/68  soft-tissue]
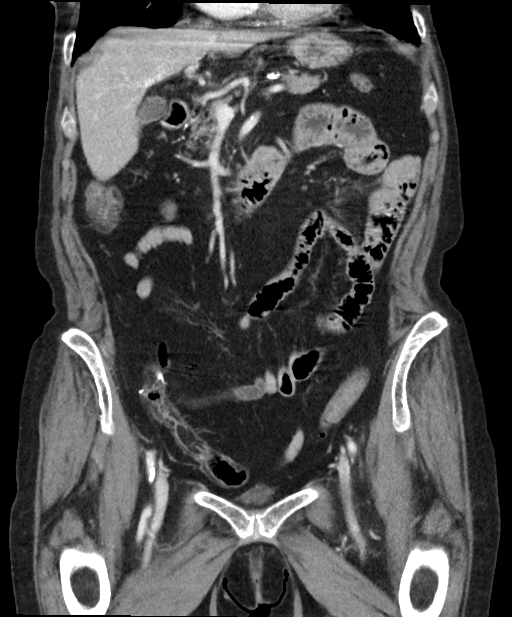
[im 30/68  soft-tissue]
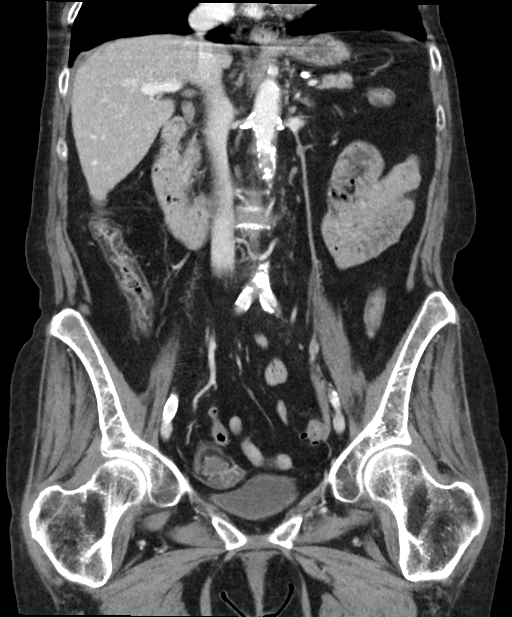
[im 38/68  soft-tissue]
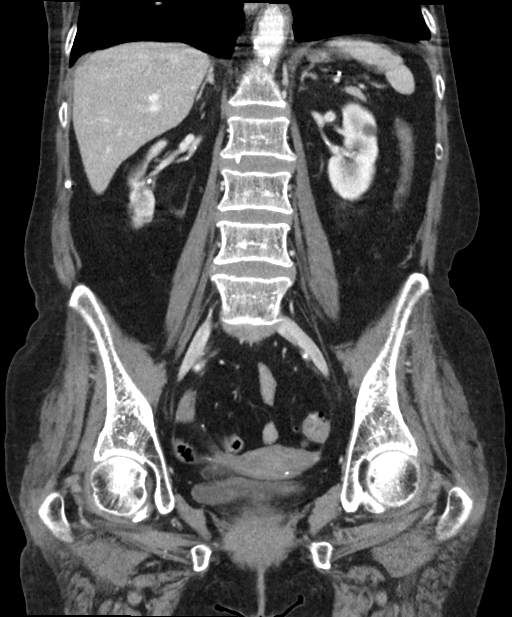

[16 of 46 positions shown; findings below may reference images not displayed]

FINDINGS: Lower chest: Peribronchial thickening. Infiltrate identified at
posterior LEFT lower lobe base.

Hepatobiliary: Gallbladder and liver normal appearance.

Pancreas: Normal appearance

Spleen: Normal appearance

Adrenals/Urinary Tract: Adrenal glands normal appearance. BILATERAL
renal cortical atrophy and cortical scarring. Small BILATERAL renal
cysts. Small nonobstructing BILATERAL renal calculi. No
hydronephrosis, hydroureter or ureteral calcification. Bladder
decompressed.

Stomach/Bowel: Normal appendix. Sigmoid diverticulosis. Minimal hazy
pericolic infiltrative changes at sigmoid colon question subtle
diverticulitis. No extraluminal gas or fluid collections. Stomach
and bowel loops otherwise normal appearance.

Vascular/Lymphatic: Atherosclerotic calcifications of aorta and
iliac arteries. Stents identified RIGHT external iliac artery and
LEFT superficial femoral artery. Aorta normal caliber. No
adenopathy.

Reproductive: Uterus and ovaries normal appearance

Other: No free air or free fluid.  No hernia.

Musculoskeletal: Bones demineralized.
IMPRESSION: Sigmoid diverticulosis with subtle pericolic infiltrative change
question subtle diverticulitis.

BILATERAL renal cortical atrophy, renal cysts and nonobstructing
calculi.

Bronchitic changes with minimal LEFT lower lobe infiltrate.

No other acute intra-abdominal or intrapelvic abnormalities.

## 2019-09-10 IMAGING — CR DG CHEST 2V
2 series · 2 of 2 positions shown · non-contrast
Comparison: 08/29/2009 chest radiograph.

CLINICAL DATA: Upper respiratory infection, dyspnea, hypoxia

EXAM:
CHEST - 2 VIEW

[chest lat]
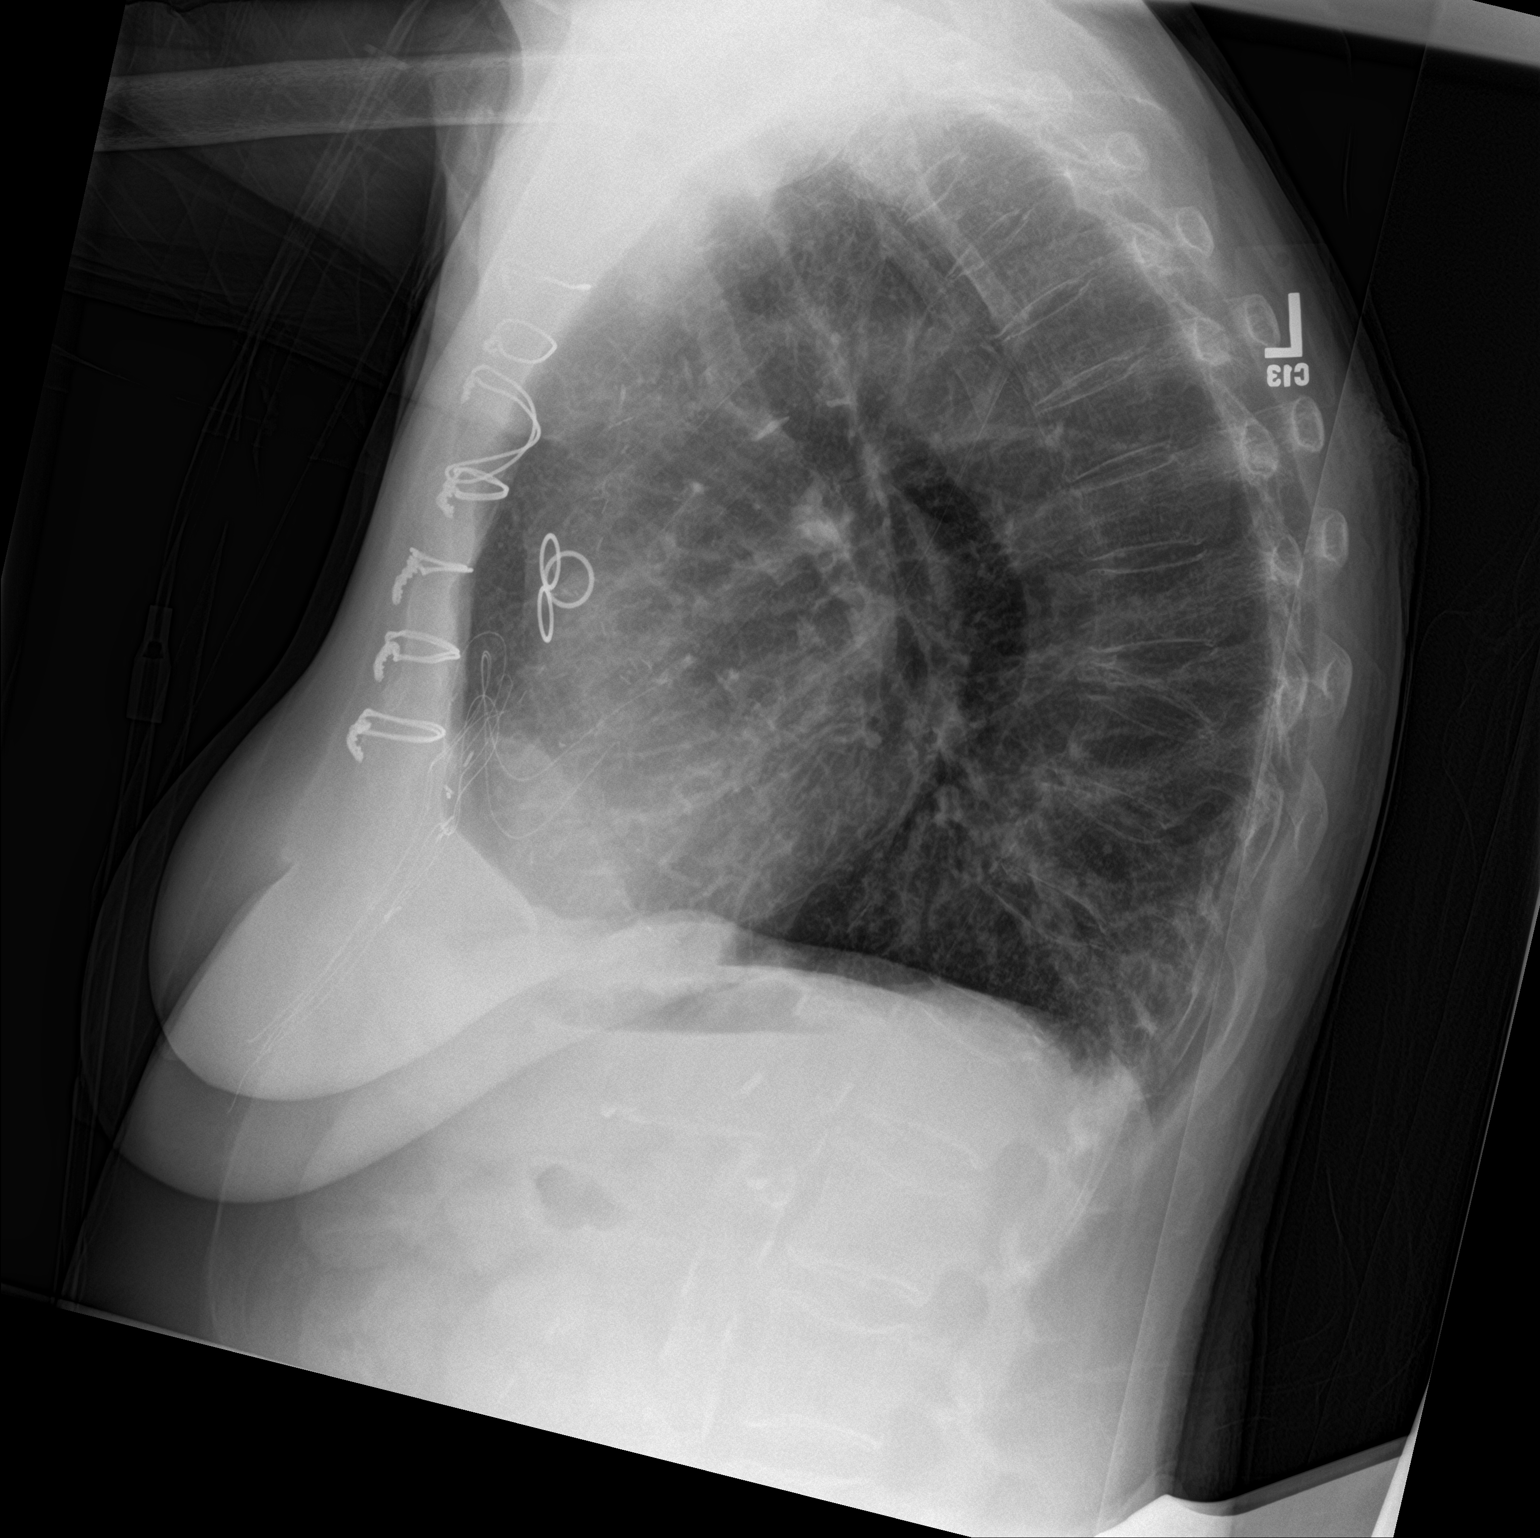

[chest ap]
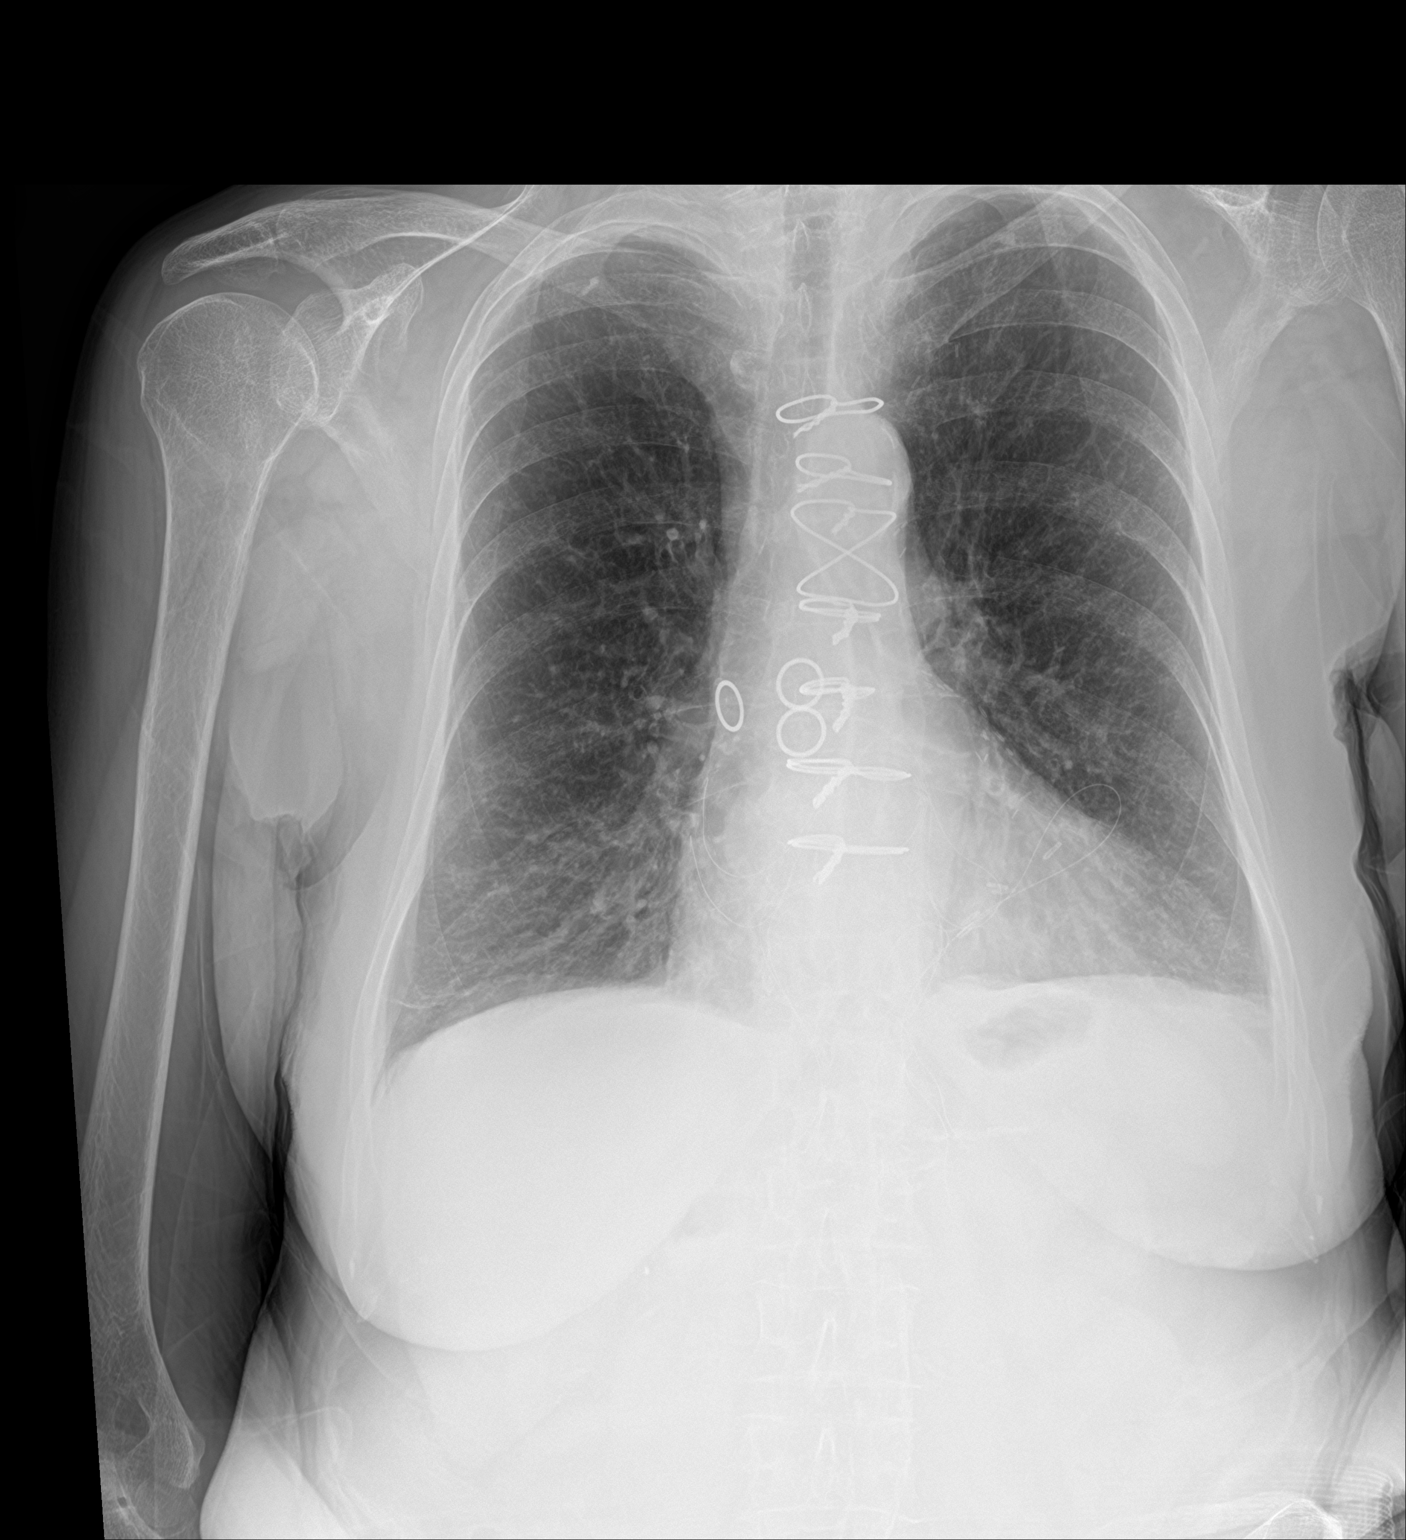

[2 of 2 positions shown; findings below may reference images not displayed]

FINDINGS: Intact sternotomy wires. Retained epicardial pacer leads overlie the
heart. Stable cardiomediastinal silhouette with normal heart size.
No pneumothorax. No pleural effusion. Mildly hyperinflated lungs. No
pulmonary edema. No acute consolidative airspace disease. Minimal
right lung base scarring.
IMPRESSION: 1. No acute cardiopulmonary disease.
2. Mildly hyperinflated lungs, suggesting COPD.
3. Minimal right lung base scarring.

## 2019-09-16 ENCOUNTER — Other Ambulatory Visit: Payer: Self-pay

## 2019-09-16 ENCOUNTER — Ambulatory Visit (INDEPENDENT_AMBULATORY_CARE_PROVIDER_SITE_OTHER): Payer: Medicare Other | Admitting: Dermatology

## 2019-09-16 DIAGNOSIS — L853 Xerosis cutis: Secondary | ICD-10-CM | POA: Diagnosis not present

## 2019-09-16 DIAGNOSIS — L578 Other skin changes due to chronic exposure to nonionizing radiation: Secondary | ICD-10-CM | POA: Diagnosis not present

## 2019-09-16 DIAGNOSIS — L820 Inflamed seborrheic keratosis: Secondary | ICD-10-CM | POA: Diagnosis not present

## 2019-09-16 DIAGNOSIS — L821 Other seborrheic keratosis: Secondary | ICD-10-CM

## 2019-09-16 DIAGNOSIS — R21 Rash and other nonspecific skin eruption: Secondary | ICD-10-CM

## 2019-09-16 NOTE — Progress Notes (Signed)
   Follow-Up Visit   Subjective  Olivia Lane is a 84 y.o. female who presents for the following: Rash (atopic dermatitis vs contact dermatitis - pt states that itching has improved. She is currently using Allegra 180mg  po QD, and Clobetasol/CeraVe mix QD-BID. Pt would like to know if it is OK to get the COVID vaccine since she has a hx of rash.). Rash She is on medications as noted and overall her rash has improved. Her rash affects trunk and extremities. She has been experiencing itching. Overall, symptoms have been moderate.   The following portions of the chart were reviewed this encounter and updated as appropriate:     Review of Systems: No other skin or systemic complaints.  Objective  Well appearing patient in no apparent distress; mood and affect are within normal limits.  A focused examination was performed including the trunk and extremities. Relevant physical exam findings are noted in the Assessment and Plan.  Objective  face, trunk, extremities: Diffuse scaly erythematous macules with underlying dyspigmentation.   Objective  back x 2: Erythematous keratotic or waxy stuck-on papule or plaque.   Objective  Trunk, extremities: Xerosis of the trunk and extremities - otherwise clear.  Objective  trunk, extremities, face: Stuck-on, waxy, tan-brown papule or plaque --Discussed benign etiology and prognosis.   Objective  trunk, extremities: Dry scaly patches  Assessment & Plan  Actinic skin damage face, trunk, extremities  The patient will observe these symptoms, and report promptly any worsening or unexpected persistence.  If well, may return prn.   Inflamed seborrheic keratosis back x 2  Destruction of lesion - back x 2  Destruction method: cryotherapy   Lesion destroyed using liquid nitrogen: Yes (x 2)    Rash Trunk, extremities  ATOPIC DERMATITIS VS CONTACT DERMATITIS - (+) reaction to nickel and an questionable reaction to Thiuram mix  during patch testing. Most likely a reaction from above two agents, or spraying done around the home. Controlled and improved today. Continue Allegra 180mg  po QAM and Clobetasol/CeraVe mix QD-BID PRN. Advised pt OK to receive COVID vaccine even though she has a hx of rash.   Seborrheic keratosis trunk, extremities, face  benign, observe   Xerosis cutis trunk, extremities  Recommend CeraVe cream moisturizer daily  Return in about 3 months (around 12/17/2019) for F/U appt. - recheck rash .

## 2019-09-21 ENCOUNTER — Ambulatory Visit: Payer: Medicare Other | Attending: Internal Medicine

## 2019-09-21 DIAGNOSIS — Z23 Encounter for immunization: Secondary | ICD-10-CM

## 2019-09-21 NOTE — Progress Notes (Signed)
   Covid-19 Vaccination Clinic  Name:  Haillee Firebaugh    MRN: [id number] DOB: April 28, 1936  09/21/2019  Ms. Quigg was observed post Covid-19 immunization for 15 minutes without incident. She was provided with Vaccine Information Sheet and instruction to access the V-Safe system.   Ms. Hansman was instructed to call 911 with any severe reactions post vaccine: Marland Kitchen Difficulty breathing  . Swelling of face and throat  . A fast heartbeat  . A bad rash all over body  . Dizziness and weakness   Immunizations Administered    Name Date Dose VIS Date Route   Pfizer COVID-19 Vaccine 09/21/2019 10:50 AM 0.3 mL 06/12/2019 Intramuscular   Manufacturer: [employer], [employer]   Lot: [id number]   [place]: [id number]

## 2019-10-12 ENCOUNTER — Other Ambulatory Visit: Payer: Self-pay

## 2019-10-12 MED ORDER — PROAIR HFA 108 (90 BASE) MCG/ACT IN AERS: 1.0000 | INHALATION_SPRAY | Freq: Four times a day (QID) | RESPIRATORY_TRACT | 3 refills | Status: DC | PRN

## 2019-10-14 ENCOUNTER — Ambulatory Visit: Payer: Medicare Other | Attending: Internal Medicine

## 2019-10-14 DIAGNOSIS — Z23 Encounter for immunization: Secondary | ICD-10-CM

## 2019-10-14 NOTE — Progress Notes (Signed)
   Covid-19 Vaccination Clinic  Name:  Olivia Lane    MRN: [id number] DOB: 05-30-1936  10/14/2019  Olivia Lane was observed post Covid-19 immunization for 15 minutes without incident. She was provided with Vaccine Information Sheet and instruction to access the V-Safe system.   Olivia Lane was instructed to call 911 with any severe reactions post vaccine: Marland Kitchen Difficulty breathing  . Swelling of face and throat  . A fast heartbeat  . A bad rash all over body  . Dizziness and weakness   Immunizations Administered    Name Date Dose VIS Date Route   Pfizer COVID-19 Vaccine 10/14/2019 11:09 AM 0.3 mL 06/12/2019 Intramuscular   Manufacturer: [place]   Lot: [id number]   [place]: [id number]

## 2019-11-12 ENCOUNTER — Other Ambulatory Visit: Payer: Self-pay

## 2019-11-12 MED ORDER — CLOPIDOGREL BISULFATE 75 MG PO TABS: 75.0000 mg | ORAL_TABLET | Freq: Every day | ORAL | 2 refills | Status: DC

## 2019-11-12 MED ORDER — PANTOPRAZOLE SODIUM 40 MG PO TBEC: 40.0000 mg | DELAYED_RELEASE_TABLET | Freq: Two times a day (BID) | ORAL | 3 refills | Status: DC

## 2019-11-20 ENCOUNTER — Telehealth: Payer: Self-pay

## 2019-11-20 NOTE — Telephone Encounter (Signed)
Confirmed appointment on 11/24/2019 and screened for covid. klh 

## 2019-11-24 ENCOUNTER — Encounter: Payer: Self-pay | Admitting: Adult Health

## 2019-11-24 ENCOUNTER — Ambulatory Visit (INDEPENDENT_AMBULATORY_CARE_PROVIDER_SITE_OTHER): Payer: Medicare Other | Admitting: Adult Health

## 2019-11-24 ENCOUNTER — Other Ambulatory Visit: Payer: Self-pay

## 2019-11-24 VITALS — BP 132/80 | HR 70 | Temp 97.2°F | Resp 16 | Ht 62.5 in | Wt 107.8 lb

## 2019-11-24 DIAGNOSIS — J449 Chronic obstructive pulmonary disease, unspecified: Secondary | ICD-10-CM | POA: Diagnosis not present

## 2019-11-24 DIAGNOSIS — E782 Mixed hyperlipidemia: Secondary | ICD-10-CM

## 2019-11-24 DIAGNOSIS — I10 Essential (primary) hypertension: Secondary | ICD-10-CM

## 2019-11-24 DIAGNOSIS — K219 Gastro-esophageal reflux disease without esophagitis: Secondary | ICD-10-CM

## 2019-11-24 DIAGNOSIS — J45909 Unspecified asthma, uncomplicated: Secondary | ICD-10-CM | POA: Diagnosis not present

## 2019-11-24 NOTE — Progress Notes (Signed)
[facility] [place], [place] [address]  Internal MEDICINE  Office Visit Note  Patient Name: Olivia Lane  [id number]  MC:[id number]  Date of Service: 11/24/2019  Chief Complaint  Patient presents with  . Follow-up  . Gastroesophageal Reflux  . Hypertension  . Hyperlipidemia    HPI  Pt is here for follow up on GERD, COPD, HTN, HLD. Overall she is doing well and denies any complaints.  Her blood pressure has been controlled (per patient) she Denies Chest pain, Shortness of breath, palpitations, headache, or blurred vision. Her Jerrye Bushy is well controlled with protonix.  Her copd is also controlled.          Current Medication: Outpatient Encounter Medications as of 11/24/2019  Medication Sig  . amLODipine (NORVASC) 10 MG tablet TAKE ONE TABLET BY MOUTH EVERY NIGHT FORHYPERTENSION  . atorvastatin (LIPITOR) 40 MG tablet TAKE 1 TABLET BY MOUTH AT BEDTIME  . budesonide-formoterol (SYMBICORT) 80-4.5 MCG/ACT inhaler Inhale 1 puff into the lungs 2 (two) times daily.  . clopidogrel (PLAVIX) 75 MG tablet Take 1 tablet (75 mg total) by mouth daily.  . metoprolol tartrate (LOPRESSOR) 50 MG tablet Take 1 tablet (50 mg total) by mouth every 12 (twelve) hours.  . ondansetron (ZOFRAN ODT) 4 MG disintegrating tablet Take 1 tablet (4 mg total) by mouth every 8 (eight) hours as needed for nausea or vomiting.  . pantoprazole (PROTONIX) 40 MG tablet Take 1 tablet (40 mg total) by mouth 2 (two) times daily.  Marland Kitchen PROAIR HFA 108 (90 Base) MCG/ACT inhaler Inhale 1-2 puffs into the lungs every 6 (six) hours as needed for wheezing or shortness of breath.  . [DISCONTINUED] clotrimazole-betamethasone (LOTRISONE) cream Apply to skin of upper abdomen twice daily as needed (Patient not taking: Reported on 09/16/2019)  . [DISCONTINUED] triamcinolone ointment (KENALOG) 0.5 % Apply 1 application topically 2 (two) times daily. (Patient not taking: Reported on 09/16/2019)   No  facility-administered encounter medications on file as of 11/24/2019.    Surgical History: Past Surgical History:  Procedure Laterality Date  . APPENDECTOMY  2004  . BYPASS GRAFT  2014  . CATARACT EXTRACTION  [id number]  . COLONOSCOPY  2004    Medical History: Past Medical History:  Diagnosis Date  . Anemia   . Gastro-esophageal reflux   . Hyperlipidemia   . Hypertension     Family History: Family History  Problem Relation Age of Onset  . Hypertension Father   . Coronary artery disease Sister   . Hypertension Sister   . Diabetes Brother   . Hypertension Brother     Social History   Socioeconomic History  . Marital status: Single    Spouse name: Not on file  . Number of children: Not on file  . Years of education: Not on file  . Highest education level: Not on file  Occupational History  . Not on file  Tobacco Use  . Smoking status: Former Smoker    Packs/day: 1.00    Years: 40.00    Pack years: 40.00    Types: Cigarettes    Quit date: 03/24/2013    Years since quitting: 6.6  . Smokeless tobacco: Never Used  Substance and Sexual Activity  . Alcohol use: No  . Drug use: No  . Sexual activity: Not on file  Other Topics Concern  . Not on file  Social History Narrative  . Not on file   Social Determinants of Health   Financial Resource Strain:   .  Difficulty of Paying Living Expenses:   Food Insecurity:   . Worried About Charity fundraiser in the Last Year:   . Arboriculturist in the Last Year:   Transportation Needs:   . Film/video editor (Medical):   Marland Kitchen Lack of Transportation (Non-Medical):   Physical Activity:   . Days of Exercise per Week:   . Minutes of Exercise per Session:   Stress:   . Feeling of Stress :   Social Connections:   . Frequency of Communication with Friends and Family:   . Frequency of Social Gatherings with Friends and Family:   . Attends Religious Services:   . Active Member of Clubs or Organizations:   . Attends English as a second language teacher Meetings:   Marland Kitchen Marital Status:   Intimate Partner Violence:   . Fear of Current or Ex-Partner:   . Emotionally Abused:   Marland Kitchen Physically Abused:   . Sexually Abused:       Review of Systems  Constitutional: Negative for chills, fatigue and unexpected weight change.  HENT: Negative for congestion, rhinorrhea, sneezing and sore throat.   Eyes: Negative for photophobia, pain and redness.  Respiratory: Negative for cough, chest tightness and shortness of breath.   Cardiovascular: Negative for chest pain and palpitations.  Gastrointestinal: Negative for abdominal pain, constipation, diarrhea, nausea and vomiting.  Endocrine: Negative.   Genitourinary: Negative for dysuria and frequency.  Musculoskeletal: Negative for arthralgias, back pain, joint swelling and neck pain.  Skin: Negative for rash.  Allergic/Immunologic: Negative.   Neurological: Negative for tremors and numbness.  Hematological: Negative for adenopathy. Does not bruise/bleed easily.  Psychiatric/Behavioral: Negative for behavioral problems and sleep disturbance. The patient is not nervous/anxious.     Vital Signs: BP (!) 147/48   Pulse 70   Temp (!) 97.2 F (36.2 C)   Resp 16   Ht 5' 2.5" (1.588 m)   Wt 107 lb 12.8 oz (48.9 kg)   SpO2 95%   BMI 19.40 kg/m    Physical Exam Vitals and nursing note reviewed.  Constitutional:      General: She is not in acute distress.    Appearance: She is well-developed. She is not diaphoretic.  HENT:     Head: Normocephalic and atraumatic.     Mouth/Throat:     Pharynx: No oropharyngeal exudate.  Eyes:     Pupils: Pupils are equal, round, and reactive to light.  Neck:     Thyroid: No thyromegaly.     Vascular: No JVD.     Trachea: No tracheal deviation.  Cardiovascular:     Rate and Rhythm: Normal rate and regular rhythm.     Heart sounds: Normal heart sounds. No murmur. No friction rub. No gallop.   Pulmonary:     Effort: Pulmonary effort is normal. No  respiratory distress.     Breath sounds: Normal breath sounds. No wheezing or rales.  Chest:     Chest wall: No tenderness.  Abdominal:     Palpations: Abdomen is soft.     Tenderness: There is no abdominal tenderness. There is no guarding.  Musculoskeletal:        General: Normal range of motion.     Cervical back: Normal range of motion and neck supple.  Lymphadenopathy:     Cervical: No cervical adenopathy.  Skin:    General: Skin is warm and dry.  Neurological:     Mental Status: She is alert and oriented to person, place, and time.  Cranial Nerves: No cranial nerve deficit.  Psychiatric:        Behavior: Behavior normal.        Thought Content: Thought content normal.        Judgment: Judgment normal.    Assessment/Plan: 1. Essential hypertension Controlled, continue to monitor.   2. Gastroesophageal reflux disease without esophagitis Stable, continue  To use protonix.   3. Mixed hyperlipidemia Continue lipitor 3 days a week as discussed.   4. Chronic obstructive pulmonary disease, unspecified COPD type ([place]) Continue Symbicort use as directed. Good control of symptoms.   5. Asthma due to environmental allergies Continue proair as needed.  General Counseling: milcah pluff understanding of the findings of todays visit and agrees with plan of treatment. I have discussed any further diagnostic evaluation that may be needed or ordered today. We also reviewed her medications today. she has been encouraged to call the office with any questions or concerns that should arise related to todays visit.    No orders of the defined types were placed in this encounter.   No orders of the defined types were placed in this encounter.   Time spent: 30 Minutes   This patient was seen by Orson Gear AGNP-C in Collaboration with Dr Lavera Guise as a part of collaborative care agreement     Kendell Bane AGNP-C Internal medicine

## 2019-12-04 ENCOUNTER — Telehealth: Payer: Self-pay | Admitting: Nurse Practitioner

## 2019-12-04 ENCOUNTER — Other Ambulatory Visit: Payer: Self-pay | Admitting: Nurse Practitioner

## 2019-12-04 DIAGNOSIS — J069 Acute upper respiratory infection, unspecified: Secondary | ICD-10-CM

## 2019-12-04 MED ORDER — AMOXICILLIN-POT CLAVULANATE 875-125 MG PO TABS: 1.0000 | ORAL_TABLET | Freq: Two times a day (BID) | ORAL | 0 refills | Status: AC

## 2019-12-04 NOTE — Progress Notes (Signed)
Start augmentin 875mg  twice daily for 10 days for upper respiratory infection. Rest and increase fluids. May take OTC medications as needed to reduce acute symptoms.

## 2019-12-04 NOTE — Telephone Encounter (Signed)
Start augmentin 875mg  twice daily for 10 days for upper respiratory infection. Rest and increase fluids. May take OTC medications as needed to reduce acute symptoms. Sent prescription for augmentin to [facility].

## 2019-12-23 ENCOUNTER — Ambulatory Visit: Payer: Medicare Other | Admitting: Dermatology

## 2020-02-10 ENCOUNTER — Other Ambulatory Visit: Payer: Self-pay

## 2020-02-10 MED ORDER — AMLODIPINE BESYLATE 10 MG PO TABS: ORAL_TABLET | ORAL | 1 refills | Status: DC

## 2020-03-25 ENCOUNTER — Other Ambulatory Visit: Payer: Self-pay

## 2020-03-25 ENCOUNTER — Ambulatory Visit (INDEPENDENT_AMBULATORY_CARE_PROVIDER_SITE_OTHER): Payer: Medicare Other | Admitting: Nurse Practitioner

## 2020-03-25 VITALS — BP 148/62 | HR 81 | Temp 97.4°F | Resp 16 | Ht 62.5 in | Wt 106.4 lb

## 2020-03-25 DIAGNOSIS — E782 Mixed hyperlipidemia: Secondary | ICD-10-CM | POA: Diagnosis not present

## 2020-03-25 DIAGNOSIS — J449 Chronic obstructive pulmonary disease, unspecified: Secondary | ICD-10-CM | POA: Diagnosis not present

## 2020-03-25 DIAGNOSIS — I10 Essential (primary) hypertension: Secondary | ICD-10-CM

## 2020-03-25 DIAGNOSIS — I2510 Atherosclerotic heart disease of native coronary artery without angina pectoris: Secondary | ICD-10-CM | POA: Diagnosis not present

## 2020-03-25 DIAGNOSIS — I2583 Coronary atherosclerosis due to lipid rich plaque: Secondary | ICD-10-CM

## 2020-03-25 DIAGNOSIS — Z0001 Encounter for general adult medical examination with abnormal findings: Secondary | ICD-10-CM

## 2020-03-25 DIAGNOSIS — R300 Dysuria: Secondary | ICD-10-CM

## 2020-03-25 DIAGNOSIS — Z1231 Encounter for screening mammogram for malignant neoplasm of breast: Secondary | ICD-10-CM

## 2020-03-25 MED ORDER — PROAIR HFA 108 (90 BASE) MCG/ACT IN AERS: 1.0000 | INHALATION_SPRAY | Freq: Four times a day (QID) | RESPIRATORY_TRACT | 3 refills | Status: DC | PRN

## 2020-03-25 MED ORDER — BUDESONIDE-FORMOTEROL FUMARATE 80-4.5 MCG/ACT IN AERO: 2.0000 | INHALATION_SPRAY | Freq: Two times a day (BID) | RESPIRATORY_TRACT | 5 refills | Status: DC

## 2020-03-25 NOTE — Progress Notes (Signed)
[facility] [place], [place] [address]  Internal MEDICINE  Office Visit Note  Patient Name: Olivia Lane  [id number]  [id number]  Date of Service: 04/10/2020   Pt is here for routine health maintenance examination  Chief Complaint  Patient presents with  . Medicare Wellness    wants heart checked  . Gastroesophageal Reflux  . Hyperlipidemia  . Hypertension  . Quality Metric Gaps    tetnaus     The patient presents to the office for health maintenance exam. [name] blood pressure is generally well managed. She states that she has had heart attack in the past and has not been followed per cardiology in some time. She denies chest pain, chest pressure, or unusual shortness of breath at this time. She is due to have routine, fasting labs. She continues to use her symbicort twice daily. Will use her rescue inhaler as needed and as prescribed.     Current Medication: Outpatient Encounter Medications as of 03/25/2020  Medication Sig  . amLODipine (NORVASC) 10 MG tablet TAKE ONE TABLET BY MOUTH EVERY NIGHT FORHYPERTENSION  . atorvastatin (LIPITOR) 40 MG tablet TAKE 1 TABLET BY MOUTH AT BEDTIME  . budesonide-formoterol (SYMBICORT) 80-4.5 MCG/ACT inhaler Inhale 2 puffs into the lungs 2 (two) times daily.  . clopidogrel (PLAVIX) 75 MG tablet Take 1 tablet (75 mg total) by mouth daily.  . metoprolol tartrate (LOPRESSOR) 50 MG tablet Take 1 tablet (50 mg total) by mouth every 12 (twelve) hours.  . ondansetron (ZOFRAN ODT) 4 MG disintegrating tablet Take 1 tablet (4 mg total) by mouth every 8 (eight) hours as needed for nausea or vomiting.  . pantoprazole (PROTONIX) 40 MG tablet Take 1 tablet (40 mg total) by mouth 2 (two) times daily.  Marland Kitchen PROAIR HFA 108 (90 Base) MCG/ACT inhaler Inhale 1-2 puffs into the lungs every 6 (six) hours as needed for wheezing or shortness of breath.  . [DISCONTINUED] budesonide-formoterol (SYMBICORT) 80-4.5 MCG/ACT inhaler  Inhale 1 puff into the lungs 2 (two) times daily.  . [DISCONTINUED] PROAIR HFA 108 (90 Base) MCG/ACT inhaler Inhale 1-2 puffs into the lungs every 6 (six) hours as needed for wheezing or shortness of breath.   No facility-administered encounter medications on file as of 03/25/2020.    Surgical History: Past Surgical History:  Procedure Laterality Date  . APPENDECTOMY  2004  . BYPASS GRAFT  2014  . CATARACT EXTRACTION  [id number]  . COLONOSCOPY  2004    Medical History: Past Medical History:  Diagnosis Date  . Anemia   . Gastro-esophageal reflux   . Hyperlipidemia   . Hypertension     Family History: Family History  Problem Relation Age of Onset  . Hypertension Father   . Coronary artery disease Sister   . Hypertension Sister   . Diabetes Brother   . Hypertension Brother       Review of Systems  Constitutional: Negative for activity change, chills, fatigue and unexpected weight change.  HENT: Negative for congestion, postnasal drip, rhinorrhea, sneezing and sore throat.   Respiratory: Negative for cough, chest tightness, shortness of breath and wheezing.        Patient's asthma well managed with current inhalers.   Cardiovascular: Negative for chest pain and palpitations.  Gastrointestinal: Negative for abdominal pain, constipation, diarrhea, nausea and vomiting.  Endocrine: Negative for cold intolerance, heat intolerance, polydipsia and polyuria.  Genitourinary: Negative for dysuria, frequency and urgency.  Musculoskeletal: Negative for arthralgias, back pain, joint swelling and neck  pain.  Skin: Negative for rash.  Allergic/Immunologic: Negative for environmental allergies.  Neurological: Negative for dizziness, tremors, numbness and headaches.  Hematological: Negative for adenopathy. Does not bruise/bleed easily.  Psychiatric/Behavioral: Negative for behavioral problems (Depression), sleep disturbance and suicidal ideas. The patient is not nervous/anxious.       Today's Vitals   03/25/20 1122  BP: (!) 148/62  Pulse: 81  Resp: 16  Temp: (!) 97.4 F (36.3 C)  SpO2: 96%  Weight: 106 lb 6.4 oz (48.3 kg)  Height: 5' 2.5" (1.588 m)   Body mass index is 19.15 kg/m.  Physical Exam Vitals and nursing note reviewed.  Constitutional:      General: She is not in acute distress.    Appearance: Normal appearance. She is well-developed. She is not diaphoretic.  HENT:     Head: Normocephalic and atraumatic.     Nose: Nose normal.     Mouth/Throat:     Pharynx: No oropharyngeal exudate.  Eyes:     Pupils: Pupils are equal, round, and reactive to light.  Neck:     Thyroid: No thyromegaly.     Vascular: No carotid bruit or JVD.     Trachea: No tracheal deviation.  Cardiovascular:     Rate and Rhythm: Normal rate and regular rhythm.     Pulses: Normal pulses.     Heart sounds: Murmur heard.  No friction rub. No gallop.   Pulmonary:     Effort: Pulmonary effort is normal. No respiratory distress.     Breath sounds: Normal breath sounds. No wheezing or rales.  Chest:     Chest wall: No tenderness.     Breasts:        Right: Normal. No swelling, bleeding, inverted nipple, mass, nipple discharge, skin change or tenderness.        Left: Normal. No swelling, bleeding, inverted nipple, mass, nipple discharge, skin change or tenderness.  Abdominal:     General: Bowel sounds are normal.     Palpations: Abdomen is soft.     Tenderness: There is no abdominal tenderness.  Musculoskeletal:        General: Normal range of motion.     Cervical back: Normal range of motion and neck supple.  Lymphadenopathy:     Cervical: No cervical adenopathy.     Upper Body:     Right upper body: No axillary adenopathy.     Left upper body: No axillary adenopathy.  Skin:    General: Skin is warm and dry.  Neurological:     General: No focal deficit present.     Mental Status: She is alert and oriented to person, place, and time.     Cranial Nerves: No  cranial nerve deficit.  Psychiatric:        Mood and Affect: Mood normal.        Behavior: Behavior normal.        Thought Content: Thought content normal.        Judgment: Judgment normal.    Depression screen [facility] 2/9 03/25/2020 07/28/2019 04/15/2019 11/20/2018 07/18/2018  Decreased Interest 0 0 0 0 0  Down, Depressed, Hopeless 0 0 0 0 0  PHQ - 2 Score 0 0 0 0 0    Functional Status Survey: Is the patient deaf or have difficulty hearing?: No Does the patient have difficulty seeing, even when wearing glasses/contacts?: No Does the patient have difficulty concentrating, remembering, or making decisions?: No Does the patient have difficulty walking or climbing stairs?:  Yes Does the patient have difficulty dressing or bathing?: No Does the patient have difficulty doing errands alone such as visiting a doctor's office or shopping?: No  MMSE - [place] Exam 03/25/2020 03/18/2018  Orientation to time 5 5  Orientation to Place 5 5  Registration 3 3  Attention/ Calculation 5 5  Recall 3 3  Language- name 2 objects 2 2  Language- repeat 1 1  Language- follow 3 step command 3 3  Language- read & follow direction 1 1  Write a sentence 1 0  Copy design 1 1  Total score 30 29    Fall Risk  03/25/2020 07/28/2019 04/15/2019 11/20/2018 07/18/2018  Falls in the past year? 0 0 0 0 0    Assessment/Plan: 1. Encounter for general adult medical examination with abnormal findings Annual health maintenance exam today. Order slip given to have routine, fasting labs checked.   2. Essential hypertension Stable. Continue bp medication as prescribed   3. Coronary artery disease due to lipid rich plaque Stable. Patient currently on atorvastatin and plavix. Will get echocardiogram, carotid doppler, and abdominal vascular study for further evaluation/surveillance.  - ECHOCARDIOGRAM COMPLETE; Future - US Carotid Bilateral; Future - VAS US AORTA/IVC/ILIACS; Future  4. Mixed hyperlipidemia Check  fasting lipid panel. Adjust atorvastatin dosing as indicated.   5. Chronic obstructive pulmonary disease, unspecified COPD type ([place]) Stable. Continue symbicort twice daily and use rescue inhaler as needed and as prescribed  - PROAIR HFA 108 (90 Base) MCG/ACT inhaler; Inhale 1-2 puffs into the lungs every 6 (six) hours as needed for wheezing or shortness of breath.  Dispense: 18 g; Refill: 3 - budesonide-formoterol (SYMBICORT) 80-4.5 MCG/ACT inhaler; Inhale 2 puffs into the lungs 2 (two) times daily.  Dispense: 10 g; Refill: 5  6. Encounter for screening mammogram for malignant neoplasm of breast - MM DIGITAL SCREENING BILATERAL; Future    General Counseling: Olivia Lane understanding of the findings of todays visit and agrees with plan of treatment. I have discussed any further diagnostic evaluation that may be needed or ordered today. We also reviewed her medications today. she has been encouraged to call the office with any questions or concerns that should arise related to todays visit.    Counseling:  Cardiac risk factor modification:  1. Control blood pressure. 2. Exercise as prescribed. 3. Follow low sodium, low fat diet. and low fat and low cholestrol diet. 4. Take ASA 81mg  once a day. 5. Restricted calories diet to lose weight.  This patient was seen by Leretha Pol FNP Collaboration with Dr Lavera Guise as a part of collaborative care agreement  Orders Placed This Encounter  Procedures  . US Carotid Bilateral  . MM DIGITAL SCREENING BILATERAL  . ECHOCARDIOGRAM COMPLETE  . VAS US AORTA/IVC/ILIACS    Meds ordered this encounter  Medications  . PROAIR HFA 108 (90 Base) MCG/ACT inhaler    Sig: Inhale 1-2 puffs into the lungs every 6 (six) hours as needed for wheezing or shortness of breath.    Dispense:  18 g    Refill:  3    Order Specific Question:   Supervising Provider    Answer:   Lavera Guise [[id number]]  . budesonide-formoterol (SYMBICORT) 80-4.5 MCG/ACT  inhaler    Sig: Inhale 2 puffs into the lungs 2 (two) times daily.    Dispense:  10 g    Refill:  5    Order Specific Question:   Supervising Provider    Answer:  KHAN, FOZIA M [[id number]]    Total time spent: 31 Minutes  Time spent includes review of chart, medications, test results, and follow up plan with the patient.     Lavera Guise, MD  Internal Medicine

## 2020-04-11 ENCOUNTER — Other Ambulatory Visit: Payer: Self-pay

## 2020-04-11 DIAGNOSIS — J449 Chronic obstructive pulmonary disease, unspecified: Secondary | ICD-10-CM

## 2020-04-11 MED ORDER — BUDESONIDE-FORMOTEROL FUMARATE 160-4.5 MCG/ACT IN AERO: 2.0000 | INHALATION_SPRAY | Freq: Two times a day (BID) | RESPIRATORY_TRACT | 12 refills | Status: DC

## 2020-04-20 ENCOUNTER — Other Ambulatory Visit: Payer: Medicare Other

## 2020-04-29 ENCOUNTER — Other Ambulatory Visit: Payer: Medicare Other

## 2020-05-06 ENCOUNTER — Other Ambulatory Visit: Payer: Medicare Other

## 2020-05-11 ENCOUNTER — Other Ambulatory Visit: Payer: Self-pay

## 2020-05-11 MED ORDER — PANTOPRAZOLE SODIUM 40 MG PO TBEC
40.0000 mg | DELAYED_RELEASE_TABLET | Freq: Two times a day (BID) | ORAL | 3 refills | Status: DC
Start: 2020-05-11 — End: 2020-09-09

## 2020-05-20 ENCOUNTER — Ambulatory Visit: Payer: Medicare Other | Admitting: Nurse Practitioner

## 2020-06-09 ENCOUNTER — Other Ambulatory Visit: Payer: Self-pay

## 2020-06-09 MED ORDER — METOPROLOL TARTRATE 50 MG PO TABS
50.0000 mg | ORAL_TABLET | Freq: Two times a day (BID) | ORAL | 1 refills | Status: DC
Start: 2020-06-09 — End: 2021-01-16

## 2020-07-13 ENCOUNTER — Telehealth: Payer: Self-pay

## 2020-07-13 ENCOUNTER — Other Ambulatory Visit: Payer: Self-pay | Admitting: Internal Medicine

## 2020-07-13 NOTE — Telephone Encounter (Signed)
Left message to have pt call back and reschedule  her [place] appt. And then she will also need to make F/U appt. As well.

## 2020-09-08 ENCOUNTER — Ambulatory Visit (INDEPENDENT_AMBULATORY_CARE_PROVIDER_SITE_OTHER): Payer: Medicare Other | Admitting: Physician Assistant

## 2020-09-08 ENCOUNTER — Encounter: Payer: Self-pay | Admitting: Physician Assistant

## 2020-09-08 ENCOUNTER — Other Ambulatory Visit: Payer: Self-pay | Admitting: Nurse Practitioner

## 2020-09-08 DIAGNOSIS — I2510 Atherosclerotic heart disease of native coronary artery without angina pectoris: Secondary | ICD-10-CM

## 2020-09-08 DIAGNOSIS — K219 Gastro-esophageal reflux disease without esophagitis: Secondary | ICD-10-CM | POA: Diagnosis not present

## 2020-09-08 DIAGNOSIS — I2583 Coronary atherosclerosis due to lipid rich plaque: Secondary | ICD-10-CM | POA: Diagnosis not present

## 2020-09-08 DIAGNOSIS — I10 Essential (primary) hypertension: Secondary | ICD-10-CM

## 2020-09-08 DIAGNOSIS — J449 Chronic obstructive pulmonary disease, unspecified: Secondary | ICD-10-CM | POA: Diagnosis not present

## 2020-09-08 MED ORDER — AMLODIPINE BESYLATE 10 MG PO TABS: ORAL_TABLET | ORAL | 1 refills | Status: DC

## 2020-09-08 NOTE — Progress Notes (Unsigned)
[facility] [place], [place] [address]  Internal MEDICINE  Office Visit Note  Patient Name: Olivia Lane  [id number]  [id number]  Date of Service: 09/11/2020  Chief Complaint  Patient presents with  . Follow-up    Discuss canceled imaging, eyes watering, nose runny   . Hyperlipidemia  . Hypertension  . Gastroesophageal Reflux  . Anemia    HPI Pt is here for f/u. -Bp elevated-missed BP med last night and this morning. BP recheck in office 150/68. -Never had imaging done that was ordered back in Sept. And needs to reschedule that today. -She does not use symbicort inhaler. She states she only uses her Albuterol inhaler as needed--once per month. Denies wheezing or SOB. -Her Allergies are starting to flare with watery eyes and runny nose and sneezing, advised she take an antihistamine and use flonase. -She Quit smoking 5-6 years ago -She also ws given lab slip last visit and never went for labs, will need to go now.  Current Medication: Outpatient Encounter Medications as of 09/08/2020  Medication Sig  . amLODipine (NORVASC) 10 MG tablet TAKE ONE TABLET BY MOUTH EVERY NIGHT FORHYPERTENSION  . atorvastatin (LIPITOR) 40 MG tablet TAKE 1 TABLET BY MOUTH AT BEDTIME  . budesonide-formoterol (SYMBICORT) 160-4.5 MCG/ACT inhaler Inhale 2 puffs into the lungs 2 (two) times daily. Inhale 2 puffs into lungs 2 (two) times daily PLEASE USE NAME BRAND: SYMBICORT  . metoprolol tartrate (LOPRESSOR) 50 MG tablet Take 1 tablet (50 mg total) by mouth every 12 (twelve) hours.  . ondansetron (ZOFRAN ODT) 4 MG disintegrating tablet Take 1 tablet (4 mg total) by mouth every 8 (eight) hours as needed for nausea or vomiting.  Marland Kitchen PROAIR HFA 108 (90 Base) MCG/ACT inhaler Inhale 1-2 puffs into the lungs every 6 (six) hours as needed for wheezing or shortness of breath.  . [DISCONTINUED] amLODipine (NORVASC) 10 MG tablet TAKE ONE TABLET BY MOUTH EVERY NIGHT FORHYPERTENSION  .  [DISCONTINUED] clopidogrel (PLAVIX) 75 MG tablet Take 1 tablet (75 mg total) by mouth daily.  . [DISCONTINUED] pantoprazole (PROTONIX) 40 MG tablet Take 1 tablet (40 mg total) by mouth 2 (two) times daily.   No facility-administered encounter medications on file as of 09/08/2020.    Surgical History: Past Surgical History:  Procedure Laterality Date  . APPENDECTOMY  2004  . BYPASS GRAFT  2014  . CATARACT EXTRACTION  [id number]  . COLONOSCOPY  2004    Medical History: Past Medical History:  Diagnosis Date  . Anemia   . Gastro-esophageal reflux   . Hyperlipidemia   . Hypertension     Family History: Family History  Problem Relation Age of Onset  . Hypertension Father   . Coronary artery disease Sister   . Hypertension Sister   . Diabetes Brother   . Hypertension Brother     Social History   Socioeconomic History  . Marital status: Single    Spouse name: Not on file  . Number of children: Not on file  . Years of education: Not on file  . Highest education level: Not on file  Occupational History  . Not on file  Tobacco Use  . Smoking status: Former Smoker    Packs/day: 1.00    Years: 40.00    Pack years: 40.00    Types: Cigarettes    Quit date: 03/24/2013    Years since quitting: 7.4  . Smokeless tobacco: Never Used  Vaping Use  . Vaping Use: Never used  Substance  and Sexual Activity  . Alcohol use: No  . Drug use: No  . Sexual activity: Not on file  Other Topics Concern  . Not on file  Social History Narrative  . Not on file   Social Determinants of Health   Financial Resource Strain: Not on file  Food Insecurity: Not on file  Transportation Needs: Not on file  Physical Activity: Not on file  Stress: Not on file  Social Connections: Not on file  Intimate Partner Violence: Not on file      Review of Systems  Constitutional: Negative for chills, fatigue and unexpected weight change.  HENT: Positive for postnasal drip, rhinorrhea and sneezing.  Negative for ear pain, sinus pressure, sinus pain and sore throat.   Eyes: Negative for redness.       Watery eyes  Respiratory: Negative for cough, chest tightness, shortness of breath and wheezing.   Cardiovascular: Negative for chest pain and palpitations.  Gastrointestinal: Negative for abdominal pain, constipation, diarrhea, nausea and vomiting.  Genitourinary: Negative for dysuria and frequency.  Musculoskeletal: Positive for arthralgias. Negative for back pain, joint swelling and neck pain.  Skin: Negative for rash.  Neurological: Negative.  Negative for tremors and numbness.  Hematological: Negative for adenopathy. Does not bruise/bleed easily.  Psychiatric/Behavioral: Negative for behavioral problems (Depression), sleep disturbance and suicidal ideas. The patient is not nervous/anxious.     Vital Signs: BP (!) 168/62   Pulse 67   Temp (!) 97.2 F (36.2 C)   Resp 16   Ht 5\' 3"  (1.6 m)   Wt 104 lb (47.2 kg)   SpO2 97%   BMI 18.42 kg/m    Physical Exam Vitals and nursing note reviewed.  Constitutional:      General: She is not in acute distress.    Appearance: She is well-developed. She is not diaphoretic.  HENT:     Head: Normocephalic and atraumatic.     Mouth/Throat:     Pharynx: No oropharyngeal exudate.  Eyes:     Pupils: Pupils are equal, round, and reactive to light.  Neck:     Thyroid: No thyromegaly.     Vascular: No JVD.     Trachea: No tracheal deviation.  Cardiovascular:     Rate and Rhythm: Normal rate and regular rhythm.     Heart sounds: Normal heart sounds. No murmur heard. No friction rub. No gallop.   Pulmonary:     Effort: Pulmonary effort is normal. No respiratory distress.     Breath sounds: No wheezing or rales.  Chest:     Chest wall: No tenderness.  Abdominal:     General: Bowel sounds are normal.     Palpations: Abdomen is soft.  Musculoskeletal:        General: Normal range of motion.     Cervical back: Normal range of motion and  neck supple.  Lymphadenopathy:     Cervical: No cervical adenopathy.  Skin:    General: Skin is warm and dry.  Neurological:     Mental Status: She is alert and oriented to person, place, and time.     Cranial Nerves: No cranial nerve deficit.  Psychiatric:        Behavior: Behavior normal.        Thought Content: Thought content normal.        Judgment: Judgment normal.        Assessment/Plan: 1. Essential hypertension Educated pt on importance of taking medication daily as prescribed. - amLODipine (NORVASC) 10  MG tablet; TAKE ONE TABLET BY MOUTH EVERY NIGHT FORHYPERTENSION  Dispense: 90 tablet; Refill: 1  2. Coronary artery disease due to lipid rich plaque Continue atorvastatin and plavix. Will reschedule missed US carotids, echo, and US aorta/IVC/iliacs.  3. Chronic obstructive pulmonary disease, unspecified COPD type ([place]) Continue inhalers as indicated.   4. GERD Continue pantoprazole.  General Counseling: yousra ivens understanding of the findings of todays visit and agrees with plan of treatment. I have discussed any further diagnostic evaluation that may be needed or ordered today. We also reviewed her medications today. she has been encouraged to call the office with any questions or concerns that should arise related to todays visit.    No orders of the defined types were placed in this encounter.   Meds ordered this encounter  Medications  . amLODipine (NORVASC) 10 MG tablet    Sig: TAKE ONE TABLET BY MOUTH EVERY NIGHT FORHYPERTENSION    Dispense:  90 tablet    Refill:  1    This patient was seen by Drema Dallas, PA-C in collaboration with Dr. Clayborn Bigness as a part of collaborative care agreement.   Total time spent:30 Minutes Time spent includes review of chart, medications, test results, and follow up plan with the patient.      Dr Lavera Guise Internal medicine

## 2020-09-09 ENCOUNTER — Other Ambulatory Visit: Payer: Self-pay | Admitting: Physician Assistant

## 2020-10-03 ENCOUNTER — Telehealth: Payer: Self-pay | Admitting: Internal Medicine

## 2020-10-03 NOTE — Progress Notes (Signed)
  Chronic Care Management   Note  10/03/2020 Name: Olivia Lane MRN: [id number] DOB: [birth year]  Olivia Belfast Ertel is a 85 y.o. year old female who is a primary care patient of Lavera Guise, MD. I reached out to Olivia Lane by phone today in response to a referral sent by Olivia Lane's PCP, Lavera Guise, MD.   Olivia Lane was given information about Chronic Care Management services today including:  1. CCM service includes personalized support from designated clinical staff supervised by her physician, including individualized plan of care and coordination with other care providers 2. 24/7 contact phone numbers for assistance for urgent and routine care needs. 3. Service will only be billed when office clinical staff spend 20 minutes or more in a month to coordinate care. 4. Only one practitioner may furnish and bill the service in a calendar month. 5. The patient may stop CCM services at any time (effective at the end of the month) by phone call to the office staff.   Patient agreed to services and verbal consent obtained.   Follow up plan:   Olivia Lane [employer] Scheduler

## 2020-10-13 ENCOUNTER — Other Ambulatory Visit: Payer: Self-pay | Admitting: Internal Medicine

## 2020-10-19 ENCOUNTER — Other Ambulatory Visit: Payer: Medicare Other

## 2020-10-24 ENCOUNTER — Other Ambulatory Visit: Payer: Self-pay | Admitting: Physician Assistant

## 2020-10-24 DIAGNOSIS — I2510 Atherosclerotic heart disease of native coronary artery without angina pectoris: Secondary | ICD-10-CM

## 2020-10-24 DIAGNOSIS — I2583 Coronary atherosclerosis due to lipid rich plaque: Secondary | ICD-10-CM

## 2020-10-26 ENCOUNTER — Other Ambulatory Visit: Payer: Self-pay

## 2020-10-26 ENCOUNTER — Ambulatory Visit: Payer: Medicare Other

## 2020-10-26 DIAGNOSIS — I2583 Coronary atherosclerosis due to lipid rich plaque: Secondary | ICD-10-CM

## 2020-10-26 DIAGNOSIS — I2510 Atherosclerotic heart disease of native coronary artery without angina pectoris: Secondary | ICD-10-CM

## 2020-10-26 DIAGNOSIS — R010 Benign and innocent cardiac murmurs: Secondary | ICD-10-CM | POA: Diagnosis not present

## 2020-11-02 ENCOUNTER — Ambulatory Visit: Payer: Medicare Other

## 2020-11-02 ENCOUNTER — Other Ambulatory Visit: Payer: Self-pay

## 2020-11-03 ENCOUNTER — Telehealth: Payer: Self-pay | Admitting: Pharmacist

## 2020-11-03 NOTE — Progress Notes (Signed)
Chronic Care Management Pharmacy Note  11/09/2020 Name:  Olivia Lane MRN:  [id number] DOB:  [birth year]  Subjective: Olivia Lane is an 85 y.o. year old female who is a primary patient of Watford Rolls Timoteo Gaul, MD.  The CCM team was consulted for assistance with disease management and care coordination needs.    Engaged with patient by telephone for initial visit in response to provider referral for pharmacy case management and/or care coordination services.   Consent to Services:  The patient was given the following information about Chronic Care Management services today, agreed to services, and gave verbal consent: 1. CCM service includes personalized support from designated clinical staff supervised by the primary care provider, including individualized plan of care and coordination with other care providers 2. 24/7 contact phone numbers for assistance for urgent and routine care needs. 3. Service will only be billed when office clinical staff spend 20 minutes or more in a month to coordinate care. 4. Only one practitioner may furnish and bill the service in a calendar month. 5.The patient may stop CCM services at any time (effective at the end of the month) by phone call to the office staff. 6. The patient will be responsible for cost sharing (co-pay) of up to 20% of the service fee (after annual deductible is met). Patient agreed to services and consent obtained.  Patient Care Team: Lavera Guise, MD as PCP - General (Internal Medicine) Edythe Clarity, [facility] as Pharmacist (Pharmacist)  Recent office visits: 09/08/20 Mylinda Latina, PA-C. For Follow-Up. No medication changes.   Recent consult visits: None recent  Hospital visits: None in previous 6 months  Objective:  Lab Results  Component Value Date   CREATININE 1.31 (H) 08/15/2018   BUN 22 08/15/2018   GFRNONAA 38 (L) 08/15/2018   GFRAA 44 (L) 08/15/2018   NA 141 08/15/2018   K 4.2 08/15/2018   CALCIUM  8.3 (L) 08/15/2018   CO2 20 (L) 08/15/2018   GLUCOSE 122 (H) 08/15/2018    No results found for: HGBA1C, FRUCTOSAMINE, GFR, MICROALBUR  Last diabetic Eye exam: No results found for: HMDIABEYEEXA  Last diabetic Foot exam: No results found for: HMDIABFOOTEX   Lab Results  Component Value Date   CHOL 145 07/08/2017   HDL 42 07/08/2017   LDLCALC 82 07/08/2017   TRIG 105 07/08/2017    Hepatic Function Latest Ref Rng & Units 08/13/2018 07/08/2017  Total Protein 6.5 - 8.1 g/dL 7.6 6.7  Albumin 3.5 - 5.0 g/dL 3.7 3.8  AST 15 - 41 U/L 38 16  ALT 0 - 44 U/L 15 9  Alk Phosphatase 38 - 126 U/L 93 145(H)  Total Bilirubin 0.3 - 1.2 mg/dL 0.7 0.3    Lab Results  Component Value Date/Time   TSH 3.580 07/08/2017 08:42 AM   FREET4 1.54 07/08/2017 08:42 AM    CBC Latest Ref Rng & Units 08/13/2018 04/28/2018 03/24/2018  WBC 4.0 - 10.5 K/uL 11.1(H) 9.5 9.5  Hemoglobin 12.0 - 15.0 g/dL 13.1 11.6(L) 12.0  Hematocrit 36.0 - 46.0 % 42.7 38.5 36.9  Platelets 150 - 400 K/uL 301 640(H) 655(H)    Lab Results  Component Value Date/Time   VD25OH 10.0 (L) 07/08/2017 08:42 AM    Clinical ASCVD: Yes  The ASCVD Risk score Mikey Bussing DC Jr., et al., 2013) failed to calculate for the following reasons:   The 2013 ASCVD risk score is only valid for ages 89 to 41    Depression screen PHQ  2/9 09/08/2020 03/25/2020 07/28/2019  Decreased Interest 0 0 0  Down, Depressed, Hopeless 0 0 0  PHQ - 2 Score 0 0 0      Social History   Tobacco Use  Smoking Status Former Smoker  . Packs/day: 1.00  . Years: 40.00  . Pack years: 40.00  . Types: Cigarettes  . Quit date: 03/24/2013  . Years since quitting: 7.6  Smokeless Tobacco Never Used   BP Readings from Last 3 Encounters:  09/08/20 (!) 168/62  03/25/20 (!) 148/62  11/24/19 132/80   Pulse Readings from Last 3 Encounters:  09/08/20 67  03/25/20 81  11/24/19 70   Wt Readings from Last 3 Encounters:  09/08/20 104 lb (47.2 kg)  03/25/20 106 lb 6.4 oz (48.3  kg)  11/24/19 107 lb 12.8 oz (48.9 kg)   BMI Readings from Last 3 Encounters:  09/08/20 18.42 kg/m  03/25/20 19.15 kg/m  11/24/19 19.40 kg/m    Assessment/Interventions: Review of patient past medical history, allergies, medications, health status, including review of consultants reports, laboratory and other test data, was performed as part of comprehensive evaluation and provision of chronic care management services.   SDOH:  (Social Determinants of Health) assessments and interventions performed: Yes  SDOH Screenings   Alcohol Screen: Low Risk   . Last Alcohol Screening Score (AUDIT): 0  Depression (PHQ2-9): Low Risk   . PHQ-2 Score: 0  Financial Resource Strain: Low Risk   . Difficulty of Paying Living Expenses: Not hard at all  Food Insecurity: Not on file  Housing: Not on file  Physical Activity: Not on file  Social Connections: Not on file  Stress: Not on file  Tobacco Use: Medium Risk  . Smoking Tobacco Use: Former Smoker  . Smokeless Tobacco Use: Never Used  Transportation Needs: Not on file    [place]  No Known Allergies  Medications Reviewed Today    Reviewed by Edythe Clarity, [facility] (Pharmacist) on 11/09/20 at 1444  Med List Status: <None>  Medication Order Taking? Sig Documenting Provider Last Dose Status Informant  amLODipine (NORVASC) 10 MG tablet [id number] Yes TAKE ONE TABLET BY MOUTH EVERY NIGHT FORHYPERTENSION McDonough, Lauren K, PA-C Taking Active   atorvastatin (LIPITOR) 40 MG tablet [id number] Yes TAKE 1 TABLET BY MOUTH AT BEDTIME McDonough, Si Gaul, PA-C Taking Active   budesonide-formoterol (SYMBICORT) 160-4.5 MCG/ACT inhaler [id number] Yes Inhale 2 puffs into the lungs 2 (two) times daily. Inhale 2 puffs into lungs 2 (two) times daily PLEASE USE NAME BRAND: SYMBICORT Lavera Guise, MD Taking Active   clopidogrel (PLAVIX) 75 MG tablet [id number] Yes TAKE 1 TABLET BY MOUTH ONCE A DAY McDonough, Lauren K, PA-C Taking Active   metoprolol  tartrate (LOPRESSOR) 50 MG tablet [id number] Yes Take 1 tablet (50 mg total) by mouth every 12 (twelve) hours. Ronnell Freshwater, NP Taking Active   pantoprazole (PROTONIX) 40 MG tablet [id number] Yes TAKE 1 TABLET BY MOUTH TWICE A DAY McDonough, Lauren K, PA-C Taking Active   PROAIR HFA 108 (90 Base) MCG/ACT inhaler [id number] Yes Inhale 1-2 puffs into the lungs every 6 (six) hours as needed for wheezing or shortness of breath. Ronnell Freshwater, NP Taking Active           Patient Active Problem List   Diagnosis Date Noted  . Cutaneous candidiasis 06/19/2019  . Encounter for general adult medical examination with abnormal findings 03/24/2019  . Atopic dermatitis 03/24/2019  . Rash and nonspecific skin eruption 03/24/2019  .  Dysuria 03/24/2019  . Sepsis ([place]) 08/13/2018  . Screening for breast cancer 03/18/2018  . Thrombocytosis 12/08/2017  . Chronic obstructive pulmonary disease, unspecified ([place]) 07/11/2017  . Gastro-esophageal reflux disease with esophagitis 07/11/2017  . Mixed hyperlipidemia 07/11/2017  . Coronary artery disease 08/27/2016  . Nausea 12/02/2013  . Postoperative atrial fibrillation ([place]) 06/07/2013  . S/P CABG x 4 06/03/2013  . Abnormal stress electrocardiogram test 05/19/2013  . Anemia 05/19/2013  . Former smoker 05/19/2013  . Essential hypertension 05/19/2013  . PAD (peripheral artery disease) ([place]) 05/19/2013  . S/P appendectomy 05/19/2013    Immunization History  Administered Date(s) Administered  . PFIZER(Purple Top)SARS-COV-2 Vaccination 09/21/2019, 10/14/2019  . Pneumococcal Polysaccharide-23 04/22/2014    Conditions to be addressed/monitored:  CAD, HTN, PAD, COPD, GERD, HLD.  Care Plan : General Pharmacy (Adult)  Updates made by Edythe Clarity, RPH since 11/09/2020 12:00 AM    Problem: CAD, HTN, PAD, COPD, GERD, HLD.   Priority: High  Onset Date: 11/08/2020    Long-Range Goal: Patient-Specific Goal   Start Date: 11/08/2020  Expected End  Date: 05/12/2021  This Visit's Progress: On track  Priority: High  Note:   Current Barriers:  . Unable to independently monitor therapeutic efficacy . Unable to maintain control of cholesterol . Does not adhere to prescribed medication regimen  Pharmacist Clinical Goal(s):  Marland Kitchen Patient will achieve adherence to monitoring guidelines and medication adherence to achieve therapeutic efficacy . achieve control of cholesterol as evidenced by labs . adhere to prescribed medication regimen as evidenced by fill dates through collaboration with PharmD and provider.   Interventions: . 1:1 collaboration with Lavera Guise, MD regarding development and update of comprehensive plan of care as evidenced by provider attestation and co-signature . Inter-disciplinary care team collaboration (see longitudinal plan of care) . Comprehensive medication review performed; medication list updated in electronic medical record  Hypertension (BP goal <140/90) -Controlled -Current treatment: . Amlodipine 10mg  daily  . Metoprolol tartrate 50mg  BID -Medications previously tried: none noted  -Current home readings: none noted -Current exercise habits: she walks a few times per week for a few minutes -Denies hypotensive/hypertensive symptoms -Educated on BP goals and benefits of medications for prevention of heart attack, stroke and kidney damage; Daily salt intake goal < 2300 mg; Exercise goal of 150 minutes per week; Importance of home blood pressure monitoring; -Counseled to monitor BP at home periodically, document, and provide log at future appointments -Recommended to continue current medication  Hyperlipidemia/CAD/PAD: (LDL goal < 70) -Not ideally controlled -Current treatment: . Atorvastatin 40mg  daily - patient taking one-half tablet 3 to 4 times per week . Clopidogrel 75mg  daily -Medications previously tried: none noted  -She reports that she is taking only 3 to 4 days per week and only a half  tablet, no lipid panel since 2019 -Recommend repeat lipid panel, she has CAD so goal < 70. -Educated on Cholesterol goals;  Benefits of statin for ASCVD risk reduction; Importance of limiting foods high in cholesterol; Exercise goal of 150 minutes per week; -Recommended to continue current medication Recommended repeat lipid panel ASAP to determine next steps for statin therapy.  COPD (Goal: control symptoms and prevent exacerbations) -Controlled -Current treatment  . Symbicort 160-4.45mcg/act 2 puffs BID . Proair HFA 90 mcg -Medications previously tried: none noted   -Exacerbations requiring treatment in last 6 months: zero -Patient denies consistent use of maintenance inhaler, takes it mostly once daily or when needs it.  Does not want to have to rely on  it. -Frequency of rescue inhaler use: prn only -Counseled on Pro[place] inhaler technique; Benefits of consistent maintenance inhaler use When to use rescue inhaler Differences between maintenance and rescue inhalers -Recommended to continue current medication Recommended consistent use of maintenance inhaler  GERD (Goal: Minimize symptoms) -Controlled -Current treatment  . Pantoprazole 71m twice daily -Medications previously tried: none noted -Denies symptoms, reports correct medication administration -Recommended to continue current medication   Patient Goals/Self-Care Activities . Patient will:  - take medications as prescribed focus on medication adherence by fill dates check blood pressure when symptomatic, document, and provide at future appointments target a minimum of 150 minutes of moderate intensity exercise weekly  Follow Up Plan: The care management team will reach out to the patient again over the next 120 days.         Medication Assistance: None required.  Patient affirms current coverage meets needs.  [facility] [place]: [phone]: [phone] Uses pill box? Yes - weekly pill box Pt endorses 100% compliance  We discussed: Benefits of medication synchronization, packaging and delivery as well as enhanced pharmacist oversight with [employer]. Patient decided to: Continue current medication management strategy  Care Plan and Follow Up Patient Decision:  Patient agrees to Care Plan and Follow-up.  Plan: The care management team will reach out to the patient again over the next 120 days.  CBeverly Milch PharmD Clinical Pharmacist [phone]

## 2020-11-03 NOTE — Progress Notes (Addendum)
    Chronic Care Management Pharmacy Assistant   Name: Taegen Lennox  MRN: [id number] DOB: 1936-04-13  Olivia Lane is an 85 y.o. year old female who presents for his initial CCM visit with the clinical pharmacist.  Reason for Encounter: Chart Prep   Conditions to be addressed/monitored: CAD, HTN, PAD, COPD, GERD, HLD.  Primary concerns for visit include: Allergies   Recent office visits:  09/08/20 Mylinda Latina, PA-C. For Follow-Up. No medication changes.   Recent consult visits:  None in the last 6 months.  Hospital visits:  None in previous 6 months   Medication His  Medications: Outpatient Encounter Medications as of 11/03/2020  Medication Sig   amLODipine (NORVASC) 10 MG tablet TAKE ONE TABLET BY MOUTH EVERY NIGHT FORHYPERTENSION   atorvastatin (LIPITOR) 40 MG tablet TAKE 1 TABLET BY MOUTH AT BEDTIME   budesonide-formoterol (SYMBICORT) 160-4.5 MCG/ACT inhaler Inhale 2 puffs into the lungs 2 (two) times daily. Inhale 2 puffs into lungs 2 (two) times daily PLEASE USE NAME BRAND: SYMBICORT   clopidogrel (PLAVIX) 75 MG tablet TAKE 1 TABLET BY MOUTH ONCE A DAY   metoprolol tartrate (LOPRESSOR) 50 MG tablet Take 1 tablet (50 mg total) by mouth every 12 (twelve) hours.   ondansetron (ZOFRAN ODT) 4 MG disintegrating tablet Take 1 tablet (4 mg total) by mouth every 8 (eight) hours as needed for nausea or vomiting.   pantoprazole (PROTONIX) 40 MG tablet TAKE 1 TABLET BY MOUTH TWICE A DAY   PROAIR HFA 108 (90 Base) MCG/ACT inhaler Inhale 1-2 puffs into the lungs every 6 (six) hours as needed for wheezing or shortness of breath.   No facility-administered encounter medications on file as of 11/03/2020.   Have you seen any other providers since your last visit? Patient stated no.  Any changes in your medications or health? Patient stated no.  Any side effects from any medications? Patient stared no.  Do you have an symptoms or problems not managed by your  medications? Patient stated no.  Any concerns about your health right now? Patient stated no  Has your provider asked that you check blood pressure, blood sugar, or follow special diet at home? Patient stated no.  Do you get any type of exercise on a regular basis? Patient stated 3-4 times a week for about 3 min.  Can you think of a goal you would like to reach for your health? Patient stated no.  Do you have any problems getting your medications? Patient stated no  Is there anything that you would like to discuss during the appointment? Patient stated she would like to talk about her allergies.   Please bring medications and supplements to appointment, patient reminded of OTP appointment on 5/10 at 3 pm.  [place], [place] Pharmacist Assistant [phone]

## 2020-11-08 ENCOUNTER — Ambulatory Visit: Payer: Medicare Other | Admitting: Pharmacist

## 2020-11-08 DIAGNOSIS — K2100 Gastro-esophageal reflux disease with esophagitis, without bleeding: Secondary | ICD-10-CM

## 2020-11-08 DIAGNOSIS — I2583 Coronary atherosclerosis due to lipid rich plaque: Secondary | ICD-10-CM

## 2020-11-08 DIAGNOSIS — I10 Essential (primary) hypertension: Secondary | ICD-10-CM

## 2020-11-08 DIAGNOSIS — I2510 Atherosclerotic heart disease of native coronary artery without angina pectoris: Secondary | ICD-10-CM

## 2020-11-08 DIAGNOSIS — E782 Mixed hyperlipidemia: Secondary | ICD-10-CM

## 2020-11-08 DIAGNOSIS — J449 Chronic obstructive pulmonary disease, unspecified: Secondary | ICD-10-CM

## 2020-11-09 NOTE — Patient Instructions (Addendum)
Visit Information  Goals Addressed            This Visit's Progress   . Manage My Medicine       Timeframe:  Long-Range Goal Priority:  High Start Date:  11/08/20                           Expected End Date: 05/11/21                      Follow Up Date 02/08/21   - use a pillbox to sort medicine - use an alarm clock or phone to remind me to take my medicine    Why is this important?   . These steps will help you keep on track with your medicines.   Notes: Remember use of maintenance inhaler (Symbicort) daily!      Patient Care Plan: General Pharmacy (Adult)    Problem Identified: CAD, HTN, PAD, COPD, GERD, HLD.   Priority: High  Onset Date: 11/08/2020    Long-Range Goal: Patient-Specific Goal   Start Date: 11/08/2020  Expected End Date: 05/12/2021  This Visit's Progress: On track  Priority: High  Note:   Current Barriers:  . Unable to independently monitor therapeutic efficacy . Unable to maintain control of cholesterol . Does not adhere to prescribed medication regimen  Pharmacist Clinical Goal(s):  Marland Kitchen Patient will achieve adherence to monitoring guidelines and medication adherence to achieve therapeutic efficacy . achieve control of cholesterol as evidenced by labs . adhere to prescribed medication regimen as evidenced by fill dates through collaboration with PharmD and provider.   Interventions: . 1:1 collaboration with Lavera Guise, MD regarding development and update of comprehensive plan of care as evidenced by provider attestation and co-signature . Inter-disciplinary care team collaboration (see longitudinal plan of care) . Comprehensive medication review performed; medication list updated in electronic medical record  Hypertension (BP goal <140/90) -Controlled -Current treatment: . Amlodipine 10mg  daily  . Metoprolol tartrate 50mg  BID -Medications previously tried: none noted  -Current home readings: none noted -Current exercise habits: she walks a  few times per week for a few minutes -Denies hypotensive/hypertensive symptoms -Educated on BP goals and benefits of medications for prevention of heart attack, stroke and kidney damage; Daily salt intake goal < 2300 mg; Exercise goal of 150 minutes per week; Importance of home blood pressure monitoring; -Counseled to monitor BP at home periodically, document, and provide log at future appointments -Recommended to continue current medication  Hyperlipidemia/CAD/PAD: (LDL goal < 70) -Not ideally controlled -Current treatment: . Atorvastatin 40mg  daily - patient taking one-half tablet 3 to 4 times per week . Clopidogrel 75mg  daily -Medications previously tried: none noted  -She reports that she is taking only 3 to 4 days per week and only a half tablet, no lipid panel since 2019 -Recommend repeat lipid panel, she has CAD so goal < 70. -Educated on Cholesterol goals;  Benefits of statin for ASCVD risk reduction; Importance of limiting foods high in cholesterol; Exercise goal of 150 minutes per week; -Recommended to continue current medication Recommended repeat lipid panel ASAP to determine next steps for statin therapy.  COPD (Goal: control symptoms and prevent exacerbations) -Controlled -Current treatment  . Symbicort 160-4.52mcg/act 2 puffs BID . Proair HFA 90 mcg -Medications previously tried: none noted   -Exacerbations requiring treatment in last 6 months: zero -Patient denies consistent use of maintenance inhaler, takes it mostly once daily or when needs  it.  Does not want to have to rely on it. -Frequency of rescue inhaler use: prn only -Counseled on Proper inhaler technique; Benefits of consistent maintenance inhaler use When to use rescue inhaler Differences between maintenance and rescue inhalers -Recommended to continue current medication Recommended consistent use of maintenance inhaler  GERD (Goal: Minimize symptoms) -Controlled -Current treatment  . Pantoprazole  40mg  twice daily -Medications previously tried: none noted -Denies symptoms, reports correct medication administration -Recommended to continue current medication   Patient Goals/Self-Care Activities . Patient will:  - take medications as prescribed focus on medication adherence by fill dates check blood pressure when symptomatic, document, and provide at future appointments target a minimum of 150 minutes of moderate intensity exercise weekly  Follow Up Plan: The care management team will reach out to the patient again over the next 120 days.        Ms. Segers was given information about Chronic Care Management services today including:  1. CCM service includes personalized support from designated clinical staff supervised by her physician, including individualized plan of care and coordination with other care providers 2. 24/7 contact phone numbers for assistance for urgent and routine care needs. 3. Standard insurance, coinsurance, copays and deductibles apply for chronic care management only during months in which we provide at least 20 minutes of these services. Most insurances cover these services at 100%, however patients may be responsible for any copay, coinsurance and/or deductible if applicable. This service may help you avoid the need for more expensive face-to-face services. 4. Only one practitioner may furnish and bill the service in a calendar month. 5. The patient may stop CCM services at any time (effective at the end of the month) by phone call to the office staff.  Patient agreed to services and verbal consent obtained.   The patient verbalized understanding of instructions, educational materials, and care plan provided today and agreed to receive a mailed copy of patient instructions, educational materials, and care plan.  Telephone follow up appointment with pharmacy team member scheduled for: 4 months  Edythe Clarity, [facility]  COPD and Physical  Activity Chronic obstructive pulmonary disease (COPD) is a long-term (chronic) condition that affects the lungs. COPD is a general term that can be used to describe many different lung problems that cause lung swelling (inflammation) and limit airflow, including chronic bronchitis and emphysema. The main symptom of COPD is shortness of breath, which makes it harder to do even simple tasks. This can also make it harder to exercise and be active. Talk with your health care provider about treatments to help you breathe better and actions you can take to prevent breathing problems during physical activity. What are the benefits of exercising with COPD? Exercising regularly is an important part of a healthy lifestyle. You can still exercise and do physical activities even though you have COPD. Exercise and physical activity improve your shortness of breath by increasing blood flow (circulation). This causes your heart to pump more oxygen through your body. Moderate exercise can improve your:  Oxygen use.  Energy level.  Shortness of breath.  Strength in your breathing muscles.  Heart health.  Sleep.  Self-esteem and feelings of self-worth.  Depression, stress, and anxiety levels. Exercise can benefit everyone with COPD. The severity of your disease may affect how hard you can exercise, especially at first, but everyone can benefit. Talk with your health care provider about how much exercise is safe for you, and which activities and exercises are safe for  you.   What actions can I take to prevent breathing problems during physical activity?  Sign up for a pulmonary rehabilitation program. This type of program may include: ? Education about lung diseases. ? Exercise classes that teach you how to exercise and be more active while improving your breathing. This usually involves:  Exercise using your lower extremities, such as a stationary bicycle.  About 30 minutes of exercise, 2 to 5 times per  week, for 6 to 12 weeks  Strength training, such as push ups or leg lifts. ? Nutrition education. ? Group classes in which you can talk with others who also have COPD and learn ways to manage stress.  If you use an oxygen tank, you should use it while you exercise. Work with your health care provider to adjust your oxygen for your physical activity. Your resting flow rate is different from your flow rate during physical activity.  While you are exercising: ? Take slow breaths. ? Pace yourself and do not try to go too fast. ? Purse your lips while breathing out. Pursing your lips is similar to a kissing or whistling position. ? If doing exercise that uses a quick burst of effort, such as weight lifting:  Breathe in before starting the exercise.  Breathe out during the hardest part of the exercise (such as raising the weights). Where to find support You can find support for exercising with COPD from:  Your health care provider.  A pulmonary rehabilitation program.  Your local health department or community health programs.  Support groups, online or in-person. Your health care provider may be able to recommend support groups. Where to find more information You can find more information about exercising with COPD from:  American Lung Association: ClassInsider.se.  COPD Foundation: https://www.rivera.net/. Contact a health care provider if:  Your symptoms get worse.  You have chest pain.  You have nausea.  You have a fever.  You have trouble talking or catching your breath.  You want to start a new exercise program or a new activity. Summary  COPD is a general term that can be used to describe many different lung problems that cause lung swelling (inflammation) and limit airflow. This includes chronic bronchitis and emphysema.  Exercise and physical activity improve your shortness of breath by increasing blood flow (circulation). This causes your heart to provide more oxygen to your  body.  Contact your health care provider before starting any exercise program or new activity. Ask your health care provider what exercises and activities are safe for you. This information is not intended to replace advice given to you by your health care provider. Make sure you discuss any questions you have with your health care provider. Document Revised: 10/08/2018 Document Reviewed: 07/11/2017 Elsevier Patient Education  2021 [employer].

## 2020-11-29 DIAGNOSIS — I10 Essential (primary) hypertension: Secondary | ICD-10-CM | POA: Diagnosis not present

## 2020-11-29 DIAGNOSIS — I2510 Atherosclerotic heart disease of native coronary artery without angina pectoris: Secondary | ICD-10-CM | POA: Diagnosis not present

## 2020-11-29 DIAGNOSIS — J449 Chronic obstructive pulmonary disease, unspecified: Secondary | ICD-10-CM | POA: Diagnosis not present

## 2020-12-12 ENCOUNTER — Ambulatory Visit (INDEPENDENT_AMBULATORY_CARE_PROVIDER_SITE_OTHER): Payer: Medicare Other | Admitting: Physician Assistant

## 2020-12-12 ENCOUNTER — Other Ambulatory Visit: Payer: Self-pay

## 2020-12-12 ENCOUNTER — Encounter: Payer: Self-pay | Admitting: Physician Assistant

## 2020-12-12 DIAGNOSIS — I2583 Coronary atherosclerosis due to lipid rich plaque: Secondary | ICD-10-CM

## 2020-12-12 DIAGNOSIS — I10 Essential (primary) hypertension: Secondary | ICD-10-CM | POA: Diagnosis not present

## 2020-12-12 DIAGNOSIS — J449 Chronic obstructive pulmonary disease, unspecified: Secondary | ICD-10-CM | POA: Diagnosis not present

## 2020-12-12 DIAGNOSIS — I6523 Occlusion and stenosis of bilateral carotid arteries: Secondary | ICD-10-CM | POA: Diagnosis not present

## 2020-12-12 DIAGNOSIS — R931 Abnormal findings on diagnostic imaging of heart and coronary circulation: Secondary | ICD-10-CM | POA: Diagnosis not present

## 2020-12-12 DIAGNOSIS — I2510 Atherosclerotic heart disease of native coronary artery without angina pectoris: Secondary | ICD-10-CM

## 2020-12-12 NOTE — Progress Notes (Signed)
[facility] [address], [place] [address]  Internal MEDICINE  Office Visit Note  Patient Name: Olivia Lane  [id number]  [id number]  Date of Service: 12/20/2020  Chief Complaint  Patient presents with   Follow-up    Review results    Gastroesophageal Reflux   Hyperlipidemia   Hypertension   Anemia   Quality Metric Gaps    shingrix    HPI Pt is here for routine follow up to discuss recent carotid US and echo. -Carotid US did show significant stenosis, worse on the right, ranging 50-69%. Pt is still taking her plavix and lipitor for prevention. -Echo showed Normal LVEF, left atrial cavity mildly enlarged, No AS, mild aortic valve leaflet thickening with mild calcification, mod TR, RV systolic pressure showed mod elevation , and could not rule out right atrial mass. -Upon discussion of findings with pt she declines further workup at this time. Recommended TEE to investigate possible atrial mass further and discussed consequences including risk of stroke. Pt declined and states she will think about it further. Also discussed having a sleep study done based on pressures and LA dilation, but pt declined this as well despite discussion of consequences. -Pt is using inhalers  Current Medication: Outpatient Encounter Medications as of 12/12/2020  Medication Sig   amLODipine (NORVASC) 10 MG tablet TAKE ONE TABLET BY MOUTH EVERY NIGHT FORHYPERTENSION   atorvastatin (LIPITOR) 40 MG tablet TAKE 1 TABLET BY MOUTH AT BEDTIME   budesonide-formoterol (SYMBICORT) 160-4.5 MCG/ACT inhaler Inhale 2 puffs into the lungs 2 (two) times daily. Inhale 2 puffs into lungs 2 (two) times daily PLEASE USE NAME BRAND: SYMBICORT   clopidogrel (PLAVIX) 75 MG tablet TAKE 1 TABLET BY MOUTH ONCE A DAY   metoprolol tartrate (LOPRESSOR) 50 MG tablet Take 1 tablet (50 mg total) by mouth every 12 (twelve) hours.   pantoprazole (PROTONIX) 40 MG tablet TAKE 1 TABLET BY MOUTH TWICE A DAY    PROAIR HFA 108 (90 Base) MCG/ACT inhaler Inhale 1-2 puffs into the lungs every 6 (six) hours as needed for wheezing or shortness of breath.   No facility-administered encounter medications on file as of 12/12/2020.    Surgical History: Past Surgical History:  Procedure Laterality Date   APPENDECTOMY  2004   BYPASS GRAFT  2014   CATARACT EXTRACTION  2006,2004   COLONOSCOPY  2004    Medical History: Past Medical History:  Diagnosis Date   Anemia    Gastro-esophageal reflux    Hyperlipidemia    Hypertension     Family History: Family History  Problem Relation Age of Onset   Hypertension Father    Coronary artery disease Sister    Hypertension Sister    Diabetes Brother    Hypertension Brother     Social History   Socioeconomic History   Marital status: Single    Spouse name: Not on file   Number of children: Not on file   Years of education: Not on file   Highest education level: Not on file  Occupational History   Not on file  Tobacco Use   Smoking status: Former    Packs/day: 1.00    Years: 40.00    Pack years: 40.00    Types: Cigarettes    Quit date: 03/24/2013    Years since quitting: 7.7   Smokeless tobacco: Never  Vaping Use   Vaping Use: Never used  Substance and Sexual Activity   Alcohol use: No   Drug use: No  Sexual activity: Not on file  Other Topics Concern   Not on file  Social History Narrative   Not on file   Social Determinants of Health   Financial Resource Strain: Low Risk    Difficulty of Paying Living Expenses: Not hard at all  Food Insecurity: Not on file  Transportation Needs: Not on file  Physical Activity: Not on file  Stress: Not on file  Social Connections: Not on file  Intimate Partner Violence: Not on file      Review of Systems  Constitutional:  Negative for chills, fatigue and unexpected weight change.  HENT:  Negative for congestion, postnasal drip, rhinorrhea, sneezing and sore throat.   Eyes:  Negative for  redness.  Respiratory:  Negative for cough, chest tightness and shortness of breath.   Cardiovascular:  Negative for chest pain and palpitations.  Gastrointestinal:  Negative for abdominal pain, constipation, diarrhea, nausea and vomiting.  Genitourinary:  Negative for dysuria and frequency.  Musculoskeletal:  Positive for arthralgias. Negative for back pain, joint swelling and neck pain.  Skin:  Negative for rash.  Neurological: Negative.  Negative for tremors and numbness.  Hematological:  Negative for adenopathy. Does not bruise/bleed easily.  Psychiatric/Behavioral:  Negative for behavioral problems (Depression), sleep disturbance and suicidal ideas. The patient is not nervous/anxious.    Vital Signs: BP 130/60   Pulse 60   Temp (!) 97.3 F (36.3 C)   Resp 16   Ht 5' 2.5" (1.588 m)   Wt 103 lb 6.4 oz (46.9 kg)   SpO2 97%   BMI 18.61 kg/m    Physical Exam Vitals and nursing note reviewed.  Constitutional:      General: She is not in acute distress.    Appearance: She is well-developed and normal weight. She is not diaphoretic.  HENT:     Head: Normocephalic and atraumatic.     Mouth/Throat:     Pharynx: No oropharyngeal exudate.  Eyes:     Pupils: Pupils are equal, round, and reactive to light.  Neck:     Thyroid: No thyromegaly.     Vascular: No JVD.     Trachea: No tracheal deviation.  Cardiovascular:     Rate and Rhythm: Normal rate and regular rhythm.     Heart sounds: Murmur heard.    No friction rub. No gallop.  Pulmonary:     Effort: Pulmonary effort is normal. No respiratory distress.     Breath sounds: No wheezing or rales.  Chest:     Chest wall: No tenderness.  Abdominal:     General: Bowel sounds are normal.     Palpations: Abdomen is soft.  Musculoskeletal:        General: Normal range of motion.     Cervical back: Normal range of motion and neck supple.  Lymphadenopathy:     Cervical: No cervical adenopathy.  Skin:    General: Skin is warm and  dry.  Neurological:     Mental Status: She is alert and oriented to person, place, and time.     Cranial Nerves: No cranial nerve deficit.  Psychiatric:        Behavior: Behavior normal.        Thought Content: Thought content normal.        Judgment: Judgment normal.       Assessment/Plan: 1. Essential hypertension Continue amlodipine and metoprolol.  2. Chronic obstructive pulmonary disease, unspecified COPD type ([place]) Continue inhalers as prescribed. Will schedule pulmonary visit.  3.  Coronary artery disease due to lipid rich plaque Continue plavix and atorvastatin  4. Abnormal echocardiogram Encouraged to pursue TEE for further work up of possible right atrial mass, as well as a PSG for RV pressure and LA dilation. Pt declines further work up for either at this time and states she understands the consequences, including risk for stroke.  5. Bilateral carotid artery stenosis Continue plavix and atorvastatin. Will continue to monitor closely with repeat carotid US vs CTA.    General Counseling: Olivia Lane understanding of the findings of todays visit and agrees with plan of treatment. I have discussed any further diagnostic evaluation that may be needed or ordered today. We also reviewed her medications today. she has been encouraged to call the office with any questions or concerns that should arise related to todays visit.    No orders of the defined types were placed in this encounter.   No orders of the defined types were placed in this encounter.   This patient was seen by Drema Dallas, PA-C in collaboration with Dr. Clayborn Bigness as a part of collaborative care agreement.   Total time spent:30 Minutes Time spent includes review of chart, medications, test results, and follow up plan with the patient.      Dr Lavera Guise Internal medicine

## 2021-01-12 ENCOUNTER — Telehealth: Payer: Self-pay | Admitting: Pharmacist

## 2021-01-12 NOTE — Progress Notes (Addendum)
Chronic Care Management Pharmacy Assistant   Name: Olivia Lane  MRN: [id number] DOB: 02-20-1936  Reason for Encounter: Disease State For HTN.   Conditions to be addressed/monitored: CAD, HTN, PAD, COPD, GERD, HLD.  Recent office visits:  12/12/20 McSonough, Si Gaul, PA-C. For follow-up. No medication changes.  Recent consult visits:  None since 11/08/20  Hospital visits:  None since 11/08/20  Medications: Outpatient Encounter Medications as of 01/12/2021  Medication Sig   amLODipine (NORVASC) 10 MG tablet TAKE ONE TABLET BY MOUTH EVERY NIGHT FORHYPERTENSION   atorvastatin (LIPITOR) 40 MG tablet TAKE 1 TABLET BY MOUTH AT BEDTIME   budesonide-formoterol (SYMBICORT) 160-4.5 MCG/ACT inhaler Inhale 2 puffs into the lungs 2 (two) times daily. Inhale 2 puffs into lungs 2 (two) times daily PLEASE USE NAME BRAND: SYMBICORT   clopidogrel (PLAVIX) 75 MG tablet TAKE 1 TABLET BY MOUTH ONCE A DAY   metoprolol tartrate (LOPRESSOR) 50 MG tablet Take 1 tablet (50 mg total) by mouth every 12 (twelve) hours.   pantoprazole (PROTONIX) 40 MG tablet TAKE 1 TABLET BY MOUTH TWICE A DAY   PROAIR HFA 108 (90 Base) MCG/ACT inhaler Inhale 1-2 puffs into the lungs every 6 (six) hours as needed for wheezing or shortness of breath.   No facility-administered encounter medications on file as of 01/12/2021.   Reviewed chart prior to disease state call. Spoke with patient regarding BP  Recent Office Vitals: BP Readings from Last 3 Encounters:  12/12/20 130/60  09/08/20 (!) 168/62  03/25/20 (!) 148/62   Pulse Readings from Last 3 Encounters:  12/12/20 60  09/08/20 67  03/25/20 81    Wt Readings from Last 3 Encounters:  12/12/20 103 lb 6.4 oz (46.9 kg)  09/08/20 104 lb (47.2 kg)  03/25/20 106 lb 6.4 oz (48.3 kg)     Kidney Function Lab Results  Component Value Date/Time   CREATININE 1.31 (H) 08/15/2018 04:51 AM   CREATININE 1.26 (H) 08/14/2018 07:55 AM   GFRNONAA 38 (L) 08/15/2018 04:51  AM   GFRAA 44 (L) 08/15/2018 04:51 AM    BMP Latest Ref Rng & Units 08/15/2018 08/14/2018 08/13/2018  Glucose 70 - 99 mg/dL 122(H) 139(H) 141(H)  BUN 8 - 23 mg/dL 22 18 19   Creatinine 0.44 - 1.00 mg/dL 1.31(H) 1.26(H) 1.51(H)  BUN/Creat Ratio 12 - 28 - - -  Sodium 135 - 145 mmol/L 141 138 136  Potassium 3.5 - 5.1 mmol/L 4.2 4.4 4.5  Chloride 98 - 111 mmol/L 115(H) 114(H) 109  CO2 22 - 32 mmol/L 20(L) 20(L) 17(L)  Calcium 8.9 - 10.3 mg/dL 8.3(L) 8.1(L) 8.6(L)    Current antihypertensive regimen:  Amlodipine 10mg  daily  Metoprolol tartrate 50mg  BID  How often are you checking your Blood Pressure? Patient stated she doesn't have a blood pressure machine at home.   Current home BP readings: Patient stated she doesn't have a blood pressure machine at home.   What recent interventions/DTPs have been made by any provider to improve Blood Pressure control since last CPP Visit: None.  Any recent hospitalizations or ED visits since last visit with CPP? Patient stated no.  What diet changes have been made to improve Blood Pressure Control?  Patient stated she eats what she has a taste for. She stated she usually eats two full meals daily and snacks throughout the day.  What exercise is being done to improve your Blood Pressure Control?  Patient stated she stays busy. She stated she goes out about every other day  and does daily house chores.   Adherence Review: Is the patient currently on ACE/ARB medication? N/A  Does the patient have >5 day gap between last estimated fill dates? N/A  Star Rating Drugs : Atorvastatin 40 mg 90 DS 10/13/20  Patient stated she did not have any questions about her medications at this time. I offered the patient RPM and she stated she did not want to start [place], RMA Clinical Pharmacist Assistant [phone]  10 minutes spent in review, coordination, and documentation.  Reviewed by: Beverly Milch,  PharmD Clinical Pharmacist [facility] [phone]

## 2021-01-13 ENCOUNTER — Other Ambulatory Visit: Payer: Self-pay | Admitting: Physician Assistant

## 2021-01-14 ENCOUNTER — Other Ambulatory Visit: Payer: Self-pay | Admitting: Nurse Practitioner

## 2021-01-16 ENCOUNTER — Other Ambulatory Visit: Payer: Self-pay | Admitting: Internal Medicine

## 2021-01-16 ENCOUNTER — Other Ambulatory Visit: Payer: Self-pay

## 2021-01-29 DIAGNOSIS — I2510 Atherosclerotic heart disease of native coronary artery without angina pectoris: Secondary | ICD-10-CM | POA: Diagnosis not present

## 2021-01-29 DIAGNOSIS — I10 Essential (primary) hypertension: Secondary | ICD-10-CM | POA: Diagnosis not present

## 2021-01-29 DIAGNOSIS — J449 Chronic obstructive pulmonary disease, unspecified: Secondary | ICD-10-CM | POA: Diagnosis not present

## 2021-02-23 ENCOUNTER — Ambulatory Visit: Payer: Medicare Other | Admitting: Internal Medicine

## 2021-03-14 ENCOUNTER — Telehealth: Payer: Self-pay

## 2021-03-15 ENCOUNTER — Other Ambulatory Visit: Payer: Self-pay | Admitting: Physician Assistant

## 2021-03-17 ENCOUNTER — Telehealth: Payer: Medicare Other

## 2021-03-22 ENCOUNTER — Telehealth (INDEPENDENT_AMBULATORY_CARE_PROVIDER_SITE_OTHER): Payer: Medicare Other | Admitting: Nurse Practitioner

## 2021-03-22 ENCOUNTER — Other Ambulatory Visit: Payer: Self-pay

## 2021-03-22 ENCOUNTER — Encounter: Payer: Self-pay | Admitting: Nurse Practitioner

## 2021-03-22 VITALS — Ht 62.0 in | Wt 105.0 lb

## 2021-03-22 DIAGNOSIS — U071 COVID-19: Secondary | ICD-10-CM

## 2021-03-22 DIAGNOSIS — R059 Cough, unspecified: Secondary | ICD-10-CM

## 2021-03-22 DIAGNOSIS — J069 Acute upper respiratory infection, unspecified: Secondary | ICD-10-CM | POA: Diagnosis not present

## 2021-03-22 DIAGNOSIS — R062 Wheezing: Secondary | ICD-10-CM

## 2021-03-22 MED ORDER — BENZONATATE 100 MG PO CAPS: 100.0000 mg | ORAL_CAPSULE | Freq: Two times a day (BID) | ORAL | 0 refills | Status: DC | PRN

## 2021-03-22 MED ORDER — PREDNISONE 10 MG PO TABS: ORAL_TABLET | ORAL | 0 refills | Status: DC

## 2021-03-22 MED ORDER — NIRMATRELVIR/RITONAVIR (PAXLOVID)TABLET: 3.0000 | ORAL_TABLET | Freq: Two times a day (BID) | ORAL | 0 refills | Status: AC

## 2021-03-22 NOTE — Progress Notes (Signed)
[facility] [place], [place] [address]  Internal MEDICINE  Telephone Visit  Patient Name: Olivia Lane  [id number]  [id number]  Date of Service: 03/22/2021  I connected with the patient at 1:35 PM by telephone and verified the patients identity using two identifiers.  Patient is unable to do video call.  I discussed the limitations, risks, security and privacy concerns of performing an evaluation and management service by telephone and the availability of in person appointments. I also discussed with the patient that there may be a patient responsible charge related to the service.  The patient expressed understanding and agrees to proceed.    Chief Complaint  Patient presents with   Telephone Assessment   Telephone Screen   Sinusitis    Covid positive today    Cough    Start on Monday     HPI Olivia Lane presents for a telehealth virtual visit for COVID positive URI symptoms. She started with a cough on Monday. She tested positive for COVID today. She reports wheezing, fatigue, body aches. She denies fever and chills.    Current Medication: Outpatient Encounter Medications as of 03/22/2021  Medication Sig   amLODipine (NORVASC) 10 MG tablet TAKE ONE TABLET BY MOUTH EVERY NIGHT FORHYPERTENSION   atorvastatin (LIPITOR) 40 MG tablet TAKE 1 TABLET BY MOUTH AT BEDTIME   benzonatate (TESSALON) 100 MG capsule Take 1 capsule (100 mg total) by mouth 2 (two) times daily as needed for cough.   budesonide-formoterol (SYMBICORT) 160-4.5 MCG/ACT inhaler Inhale 2 puffs into the lungs 2 (two) times daily. Inhale 2 puffs into lungs 2 (two) times daily PLEASE USE NAME BRAND: SYMBICORT   clopidogrel (PLAVIX) 75 MG tablet TAKE 1 TABLET BY MOUTH ONCE A DAY   metoprolol tartrate (LOPRESSOR) 50 MG tablet TAKE 1 TABLET BY MOUTH EVERY 12 HOURS   nirmatrelvir/ritonavir EUA (PAXLOVID) 20 x 150 MG & 10 x 100MG  TABS Take 3 tablets by mouth 2 (two) times daily for 5 days.    nitroGLYCERIN (NITROSTAT) 0.4 MG SL tablet Place under the tongue.   pantoprazole (PROTONIX) 40 MG tablet TAKE 1 TABLET BY MOUTH TWICE A DAY   predniSONE (DELTASONE) 10 MG tablet Take one tab 3 x day for 3 days, then take one tab 2 x a day for 3 days and then take one tab a day for 3 days for uri   PROAIR HFA 108 (90 Base) MCG/ACT inhaler Inhale 1-2 puffs into the lungs every 6 (six) hours as needed for wheezing or shortness of breath.   No facility-administered encounter medications on file as of 03/22/2021.    Surgical History: Past Surgical History:  Procedure Laterality Date   APPENDECTOMY  2004   BYPASS GRAFT  2014   CATARACT EXTRACTION  2006,2004   COLONOSCOPY  2004    Medical History: Past Medical History:  Diagnosis Date   Anemia    Gastro-esophageal reflux    Hyperlipidemia    Hypertension     Family History: Family History  Problem Relation Age of Onset   Hypertension Father    Coronary artery disease Sister    Hypertension Sister    Diabetes Brother    Hypertension Brother     Social History   Socioeconomic History   Marital status: Single    Spouse name: Not on file   Number of children: Not on file   Years of education: Not on file   Highest education level: Not on file  Occupational History  Not on file  Tobacco Use   Smoking status: Former    Packs/day: 1.00    Years: 40.00    Pack years: 40.00    Types: Cigarettes    Quit date: 03/24/2013    Years since quitting: 8.0   Smokeless tobacco: Never  Vaping Use   Vaping Use: Never used  Substance and Sexual Activity   Alcohol use: No   Drug use: No   Sexual activity: Not on file  Other Topics Concern   Not on file  Social History Narrative   Not on file   Social Determinants of Health   Financial Resource Strain: Low Risk    Difficulty of Paying Living Expenses: Not hard at all  Food Insecurity: Not on file  Transportation Needs: Not on file  Physical Activity: Not on file  Stress: Not  on file  Social Connections: Not on file  Intimate Partner Violence: Not on file      Review of Systems  Constitutional:  Positive for appetite change and fatigue. Negative for chills and fever.  HENT:  Positive for congestion.   Respiratory:  Positive for cough, chest tightness, shortness of breath and wheezing.   Cardiovascular:  Negative for chest pain and palpitations.  Gastrointestinal: Negative.  Negative for abdominal pain, constipation, diarrhea, nausea and vomiting.  Musculoskeletal:  Positive for myalgias.  Neurological: Negative.  Negative for headaches.   Vital Signs: Ht 5\' 2"  (1.575 m)   Wt 105 lb (47.6 kg)   BMI 19.20 kg/m    Observation/Objective: Olivia Lane is alert and oriented, engages in conversation appropriately. She does not sound like she is in acute distress at this time.     Assessment/Plan: 1. Upper respiratory tract infection due to COVID-19 virus Paxlovid prescribed to treat covid infection - nirmatrelvir/ritonavir EUA (PAXLOVID) 20 x 150 MG & 10 x 100MG  TABS; Take 3 tablets by mouth 2 (two) times daily for 5 days.  Dispense: 30 tablet; Refill: 0  2. Wheezing Course of prednisone prescribed to decrease inflammation and wheezing.  - predniSONE (DELTASONE) 10 MG tablet; Take one tab 3 x day for 3 days, then take one tab 2 x a day for 3 days and then take one tab a day for 3 days for uri  Dispense: 18 tablet; Refill: 0  3. Cough Medication for cough prescribed.  - benzonatate (TESSALON) 100 MG capsule; Take 1 capsule (100 mg total) by mouth 2 (two) times daily as needed for cough.  Dispense: 20 capsule; Refill: 0   General Counseling: Olivia Lane understanding of the findings of today's phone visit and agrees with plan of treatment. I have discussed any further diagnostic evaluation that may be needed or ordered today. We also reviewed her medications today. she has been encouraged to call the office with any questions or concerns that should arise  related to todays visit.  Return if symptoms worsen or fail to improve.   No orders of the defined types were placed in this encounter.   Meds ordered this encounter  Medications   nirmatrelvir/ritonavir EUA (PAXLOVID) 20 x 150 MG & 10 x 100MG  TABS    Sig: Take 3 tablets by mouth 2 (two) times daily for 5 days.    Dispense:  30 tablet    Refill:  0   predniSONE (DELTASONE) 10 MG tablet    Sig: Take one tab 3 x day for 3 days, then take one tab 2 x a day for 3 days and then take one tab  a day for 3 days for uri    Dispense:  18 tablet    Refill:  0   benzonatate (TESSALON) 100 MG capsule    Sig: Take 1 capsule (100 mg total) by mouth 2 (two) times daily as needed for cough.    Dispense:  20 capsule    Refill:  0    Time spent:15 Minutes Time spent with patient included reviewing progress notes, labs, imaging studies, and discussing plan for follow up.  [place] Controlled Substance Database was reviewed by me for overdose risk score (ORS) if appropriate.  This patient was seen by Jonetta Osgood, FNP-C in collaboration with Dr. Clayborn Bigness as a part of collaborative care agreement.  Olivia Hyson R. Valetta Fuller, MSN, FNP-C Internal medicine

## 2021-03-28 ENCOUNTER — Ambulatory Visit: Payer: Medicare Other | Admitting: Nurse Practitioner

## 2021-03-28 NOTE — Progress Notes (Signed)
Chronic Care Management Pharmacy Note  03/31/2021 Name:  Olivia Lane MRN:  [id number] DOB:  07-May-1936  Subjective: Olivia Lane is an 85 y.o. year old female who is a primary patient of Mielke Rolls Timoteo Gaul, MD.  The CCM team was consulted for assistance with disease management and care coordination needs.    Engaged with patient by telephone for follow up visit in response to provider referral for pharmacy case management and/or care coordination services.   Consent to Services:  The patient was given the following information about Chronic Care Management services today, agreed to services, and gave verbal consent: 1. CCM service includes personalized support from designated clinical staff supervised by the primary care provider, including individualized plan of care and coordination with other care providers 2. 24/7 contact phone numbers for assistance for urgent and routine care needs. 3. Service will only be billed when office clinical staff spend 20 minutes or more in a month to coordinate care. 4. Only one practitioner may furnish and bill the service in a calendar month. 5.The patient may stop CCM services at any time (effective at the end of the month) by phone call to the office staff. 6. The patient will be responsible for cost sharing (co-pay) of up to 20% of the service fee (after annual deductible is met). Patient agreed to services and consent obtained.  Patient Care Team: Lavera Guise, MD as PCP - General (Internal Medicine) Edythe Clarity, [facility] as Pharmacist (Pharmacist)  Recent office visits: 03/22/21 [place] - URTI due to COVID, given prednisone taper, tessalon, and Paxlovid 09/08/20 McDonough, Si Gaul, PA-C. For Follow-Up. No medication changes.   Recent consult visits: None recent  Hospital visits: None in previous 6 months  Objective:  Lab Results  Component Value Date   CREATININE 1.31 (H) 08/15/2018   BUN 22 08/15/2018   GFRNONAA 38 (L)  08/15/2018   GFRAA 44 (L) 08/15/2018   NA 141 08/15/2018   K 4.2 08/15/2018   CALCIUM 8.3 (L) 08/15/2018   CO2 20 (L) 08/15/2018   GLUCOSE 122 (H) 08/15/2018    No results found for: HGBA1C, FRUCTOSAMINE, GFR, MICROALBUR  Last diabetic Eye exam: No results found for: HMDIABEYEEXA  Last diabetic Foot exam: No results found for: HMDIABFOOTEX   Lab Results  Component Value Date   CHOL 145 07/08/2017   HDL 42 07/08/2017   LDLCALC 82 07/08/2017   TRIG 105 07/08/2017    Hepatic Function Latest Ref Rng & Units 08/13/2018 07/08/2017  Total Protein 6.5 - 8.1 g/dL 7.6 6.7  Albumin 3.5 - 5.0 g/dL 3.7 3.8  AST 15 - 41 U/L 38 16  ALT 0 - 44 U/L 15 9  Alk Phosphatase 38 - 126 U/L 93 145(H)  Total Bilirubin 0.3 - 1.2 mg/dL 0.7 0.3    Lab Results  Component Value Date/Time   TSH 3.580 07/08/2017 08:42 AM   FREET4 1.54 07/08/2017 08:42 AM    CBC Latest Ref Rng & Units 08/13/2018 04/28/2018 03/24/2018  WBC 4.0 - 10.5 K/uL 11.1(H) 9.5 9.5  Hemoglobin 12.0 - 15.0 g/dL 13.1 11.6(L) 12.0  Hematocrit 36.0 - 46.0 % 42.7 38.5 36.9  Platelets 150 - 400 K/uL 301 640(H) 655(H)    Lab Results  Component Value Date/Time   VD25OH 10.0 (L) 07/08/2017 08:42 AM    Clinical ASCVD: Yes  The ASCVD Risk score (Arnett DK, et al., 2019) failed to calculate for the following reasons:   The 2019 ASCVD risk score is  only valid for ages 1 to 72    Depression screen PHQ 2/9 12/12/2020 09/08/2020 03/25/2020  Decreased Interest 0 0 0  Down, Depressed, Hopeless 0 0 0  PHQ - 2 Score 0 0 0      Social History   Tobacco Use  Smoking Status Former   Packs/day: 1.00   Years: 40.00   Pack years: 40.00   Types: Cigarettes   Quit date: 03/24/2013   Years since quitting: 8.0  Smokeless Tobacco Never   BP Readings from Last 3 Encounters:  12/12/20 130/60  09/08/20 (!) 168/62  03/25/20 (!) 148/62   Pulse Readings from Last 3 Encounters:  12/12/20 60  09/08/20 67  03/25/20 81   Wt Readings from Last 3  Encounters:  03/22/21 105 lb (47.6 kg)  12/12/20 103 lb 6.4 oz (46.9 kg)  09/08/20 104 lb (47.2 kg)   BMI Readings from Last 3 Encounters:  03/22/21 19.20 kg/m  12/12/20 18.61 kg/m  09/08/20 18.42 kg/m    Assessment/Interventions: Review of patient past medical history, allergies, medications, health status, including review of consultants reports, laboratory and other test data, was performed as part of comprehensive evaluation and provision of chronic care management services.   SDOH:  (Social Determinants of Health) assessments and interventions performed: Yes  SDOH Screenings   Alcohol Screen: Low Risk    Last Alcohol Screening Score (AUDIT): 0  Depression (PHQ2-9): Low Risk    PHQ-2 Score: 0  Financial Resource Strain: Low Risk    Difficulty of Paying Living Expenses: Not hard at all  Food Insecurity: Not on file  Housing: Not on file  Physical Activity: Not on file  Social Connections: Not on file  Stress: Not on file  Tobacco Use: Medium Risk   Smoking Tobacco Use: Former   Smokeless Tobacco Use: Never  Transportation Needs: Not on file    [place]  No Known Allergies  Medications Reviewed Today     Reviewed by Edythe Clarity, [facility] (Pharmacist) on 03/31/21 at [place] List Status: <None>   Medication Order Taking? Sig Documenting Provider Last Dose Status Informant  amLODipine (NORVASC) 10 MG tablet [id number] Yes TAKE ONE TABLET BY MOUTH EVERY NIGHT FORHYPERTENSION McDonough, Lauren K, PA-C Taking Active   atorvastatin (LIPITOR) 40 MG tablet [id number] Yes TAKE 1 TABLET BY MOUTH AT BEDTIME Lavera Guise, MD Taking Active   benzonatate (TESSALON) 100 MG capsule [id number] Yes Take 1 capsule (100 mg total) by mouth 2 (two) times daily as needed for cough. Jonetta Osgood, NP Taking Active   budesonide-formoterol (SYMBICORT) 160-4.5 MCG/ACT inhaler [id number] Yes Inhale 2 puffs into the lungs 2 (two) times daily. Inhale 2 puffs into lungs 2 (two) times  daily PLEASE USE NAME BRAND: SYMBICORT Lavera Guise, MD Taking Active   clopidogrel (PLAVIX) 75 MG tablet [id number] Yes TAKE 1 TABLET BY MOUTH ONCE A DAY McDonough, Lauren K, PA-C Taking Active   metoprolol tartrate (LOPRESSOR) 50 MG tablet [id number] Yes TAKE 1 TABLET BY MOUTH EVERY 12 HOURS Lavera Guise, MD Taking Active   nitroGLYCERIN (NITROSTAT) 0.4 MG SL tablet [id number] Yes Place under the tongue. [provider] Taking Active   pantoprazole (PROTONIX) 40 MG tablet [id number] Yes TAKE 1 TABLET BY MOUTH TWICE A DAY McDonough, Lauren K, PA-C Taking Active   predniSONE (DELTASONE) 10 MG tablet [id number] Yes Take one tab 3 x day for 3 days, then take one tab 2 x a day for 3 days and then take  one tab a day for 3 days for Amanda Pea, NP Taking Active   PROAIR HFA 108 (90 Base) MCG/ACT inhaler [id number] Yes Inhale 1-2 puffs into the lungs every 6 (six) hours as needed for wheezing or shortness of breath. Ronnell Freshwater, NP Taking Active             Patient Active Problem List   Diagnosis Date Noted   Cutaneous candidiasis 06/19/2019   Encounter for general adult medical examination with abnormal findings 03/24/2019   Atopic dermatitis 03/24/2019   Rash and nonspecific skin eruption 03/24/2019   Dysuria 03/24/2019   Sepsis ([place]) 08/13/2018   Screening for breast cancer 03/18/2018   Thrombocytosis 12/08/2017   Chronic obstructive pulmonary disease, unspecified ([place]) 07/11/2017   Gastro-esophageal reflux disease with esophagitis 07/11/2017   Mixed hyperlipidemia 07/11/2017   Coronary artery disease 08/27/2016   Nausea 12/02/2013   Postoperative atrial fibrillation ([place]) 06/07/2013   S/P CABG x 4 06/03/2013   Abnormal stress electrocardiogram test 05/19/2013   Anemia 05/19/2013   Former smoker 05/19/2013   Essential hypertension 05/19/2013   PAD (peripheral artery disease) ([place]) 05/19/2013   S/P appendectomy 05/19/2013    Immunization History  Administered  Date(s) Administered   PFIZER(Purple Top)SARS-COV-2 Vaccination 09/21/2019, 10/14/2019   Pneumococcal Polysaccharide-23 04/22/2014    Conditions to be addressed/monitored:  CAD, HTN, PAD, COPD, GERD, HLD.  Care Plan : General Pharmacy (Adult)  Updates made by Edythe Clarity, [facility] since 03/31/2021 12:00 AM     Problem: CAD, HTN, PAD, COPD, GERD, HLD.   Priority: High  Onset Date: 11/08/2020     Long-Range Goal: Patient-Specific Goal   Start Date: 11/08/2020  Expected End Date: 05/12/2021  Recent Progress: On track  Priority: High  Note:   Current Barriers:  Unable to independently monitor therapeutic efficacy Unable to maintain control of cholesterol Does not adhere to prescribed medication regimen  Pharmacist Clinical Goal(s):  Patient will achieve adherence to monitoring guidelines and medication adherence to achieve therapeutic efficacy achieve control of cholesterol as evidenced by labs adhere to prescribed medication regimen as evidenced by fill dates through collaboration with PharmD and provider.   Interventions: 1:1 collaboration with Lavera Guise, MD regarding development and update of comprehensive plan of care as evidenced by provider attestation and co-signature Inter-disciplinary care team collaboration (see longitudinal plan of care) Comprehensive medication review performed; medication list updated in electronic medical record  Hypertension (BP goal <140/90) -Controlled -Current treatment: Amlodipine 56m daily  Metoprolol tartrate 548mBID -Medications previously tried: none noted  -Current home readings: none noted -Current exercise habits: she walks a few times per week for a few minutes -Denies hypotensive/hypertensive symptoms -Educated on BP goals and benefits of medications for prevention of heart attack, stroke and kidney damage; Daily salt intake goal < 2300 mg; Exercise goal of 150 minutes per week; Importance of home blood pressure  monitoring; -Counseled to monitor BP at home periodically, document, and provide log at future appointments -Recommended to continue current medication  Hyperlipidemia/CAD/PAD: (LDL goal < 70) -Not ideally controlled -Current treatment: Atorvastatin 4023maily - patient taking one-half tablet 3 to 4 times per week Clopidogrel 77m77mily -Medications previously tried: none noted  -She reports that she is taking only 3 to 4 days per week and only a half tablet, no lipid panel since 2019 -Recommend repeat lipid panel, she has CAD so goal < 70. -Educated on Cholesterol goals;  Benefits of statin for ASCVD risk reduction; Importance of limiting foods  high in cholesterol; Exercise goal of 150 minutes per week; -Recommended to continue current medication Recommended repeat lipid panel ASAP to determine next steps for statin therapy.  Update 03/31/21 Continues to take medication as previous.  She has not had updated lipid panel since last visit.  She does have her medicare AWV coming up later this year so I recommend a full follow up lab panel at this time.  Wait until labs to determine next steps with statin.  Would prefer goal < 70 due to her CAD and stenosis.  COPD (Goal: control symptoms and prevent exacerbations) -Controlled -Current treatment  Symbicort 160-4.2mg/act 2 puffs BID Proair HFA 90 mcg -Medications previously tried: none noted   -Exacerbations requiring treatment in last 6 months: zero -Patient denies consistent use of maintenance inhaler, takes it mostly once daily or when needs it.  Does not want to have to rely on it. -Frequency of rescue inhaler use: prn only -Counseled on Proper inhaler technique; Benefits of consistent maintenance inhaler use When to use rescue inhaler Differences between maintenance and rescue inhalers -Recommended to continue current medication Recommended consistent use of maintenance inhaler  Update 03/31/21 Patient reports she is using  inhalers correctly now.  However, unfortunately [place] is [place]  Took Paxlovid last week and prednisone with no concern.  She reports coughing up mucous x 1 week that is grayish in color.  She denies any SOB at this time but did have some diarrhea and feels weak overall.  Diarrhea improved with Immodium.  Will consult with PCP to determine course of action for patient.  GERD (Goal: Minimize symptoms) -Controlled -Current treatment  Pantoprazole 448mtwice daily -Medications previously tried: none noted -Denies symptoms, reports correct medication administration -Recommended to continue current medication   Patient Goals/Self-Care Activities Patient will:  - take medications as prescribed focus on medication adherence by fill dates check blood pressure when symptomatic, document, and provide at future appointments target a minimum of 150 minutes of moderate intensity exercise weekly  Follow Up Plan: The care management team will reach out to the patient again over the next 120 days.             Medication Assistance: None required.  Patient affirms current coverage meets needs.  [facility]: [phone]: [phone] pill box? Yes - weekly pill box Pt endorses 100% compliance  We discussed: Benefits of medication synchronization, packaging and delivery as well as enhanced pharmacist oversight with [employer]. Patient decided to: Continue current medication management strategy  Care Plan and Follow Up Patient Decision:  Patient agrees to Care Plan and Follow-up.  Plan: The care management team will reach out to the patient again over the next 120 days.  ChBeverly MilchPharmD Clinical Pharmacist [phone]

## 2021-03-30 ENCOUNTER — Telehealth: Payer: Self-pay

## 2021-03-30 NOTE — Telephone Encounter (Signed)
Pt called that she having diarrhea last 3 day 2 to 3 times is watery as per dr Sigmon Rolls advised her to take imodium 2 mg 1 tab today  and 1 tomorrow and also she can drink Gatorade and call us back tomorrow

## 2021-03-31 ENCOUNTER — Other Ambulatory Visit: Payer: Self-pay | Admitting: Nurse Practitioner

## 2021-03-31 ENCOUNTER — Ambulatory Visit: Payer: Medicare Other | Admitting: Pharmacist

## 2021-03-31 ENCOUNTER — Telehealth: Payer: Self-pay

## 2021-03-31 DIAGNOSIS — I2510 Atherosclerotic heart disease of native coronary artery without angina pectoris: Secondary | ICD-10-CM | POA: Diagnosis not present

## 2021-03-31 DIAGNOSIS — J449 Chronic obstructive pulmonary disease, unspecified: Secondary | ICD-10-CM | POA: Diagnosis not present

## 2021-03-31 DIAGNOSIS — R059 Cough, unspecified: Secondary | ICD-10-CM

## 2021-03-31 DIAGNOSIS — E782 Mixed hyperlipidemia: Secondary | ICD-10-CM

## 2021-03-31 DIAGNOSIS — R110 Nausea: Secondary | ICD-10-CM

## 2021-03-31 DIAGNOSIS — I10 Essential (primary) hypertension: Secondary | ICD-10-CM | POA: Diagnosis not present

## 2021-03-31 MED ORDER — ONDANSETRON HCL 4 MG PO TABS: 4.0000 mg | ORAL_TABLET | Freq: Three times a day (TID) | ORAL | 0 refills | Status: DC | PRN

## 2021-03-31 MED ORDER — BENZONATATE 100 MG PO CAPS: 100.0000 mg | ORAL_CAPSULE | Freq: Two times a day (BID) | ORAL | 0 refills | Status: DC | PRN

## 2021-03-31 NOTE — Patient Instructions (Signed)
Visit Information   Goals Addressed   None    Patient Care Plan: General Pharmacy (Adult)     Problem Identified: CAD, HTN, PAD, COPD, GERD, HLD.   Priority: High  Onset Date: 11/08/2020     Long-Range Goal: Patient-Specific Goal   Start Date: 11/08/2020  Expected End Date: 05/12/2021  This Visit's Progress: On track  Priority: High  Note:   Current Barriers:  Unable to independently monitor therapeutic efficacy Unable to maintain control of cholesterol Does not adhere to prescribed medication regimen  Pharmacist Clinical Goal(s):  Patient will achieve adherence to monitoring guidelines and medication adherence to achieve therapeutic efficacy achieve control of cholesterol as evidenced by labs adhere to prescribed medication regimen as evidenced by fill dates through collaboration with PharmD and provider.   Interventions: 1:1 collaboration with Lavera Guise, MD regarding development and update of comprehensive plan of care as evidenced by provider attestation and co-signature Inter-disciplinary care team collaboration (see longitudinal plan of care) Comprehensive medication review performed; medication list updated in electronic medical record  Hypertension (BP goal <140/90) -Controlled -Current treatment: Amlodipine 10mg  daily  Metoprolol tartrate 50mg  BID -Medications previously tried: none noted  -Current home readings: none noted -Current exercise habits: she walks a few times per week for a few minutes -Denies hypotensive/hypertensive symptoms -Educated on BP goals and benefits of medications for prevention of heart attack, stroke and kidney damage; Daily salt intake goal < 2300 mg; Exercise goal of 150 minutes per week; Importance of home blood pressure monitoring; -Counseled to monitor BP at home periodically, document, and provide log at future appointments -Recommended to continue current medication  Hyperlipidemia/CAD/PAD: (LDL goal < 70) -Not ideally  controlled -Current treatment: Atorvastatin 40mg  daily - patient taking one-half tablet 3 to 4 times per week Clopidogrel 75mg  daily -Medications previously tried: none noted  -She reports that she is taking only 3 to 4 days per week and only a half tablet, no lipid panel since 2019 -Recommend repeat lipid panel, she has CAD so goal < 70. -Educated on Cholesterol goals;  Benefits of statin for ASCVD risk reduction; Importance of limiting foods high in cholesterol; Exercise goal of 150 minutes per week; -Recommended to continue current medication Recommended repeat lipid panel ASAP to determine next steps for statin therapy.  COPD (Goal: control symptoms and prevent exacerbations) -Controlled -Current treatment  Symbicort 160-4.30mcg/act 2 puffs BID Proair HFA 90 mcg -Medications previously tried: none noted   -Exacerbations requiring treatment in last 6 months: zero -Patient denies consistent use of maintenance inhaler, takes it mostly once daily or when needs it.  Does not want to have to rely on it. -Frequency of rescue inhaler use: prn only -Counseled on Proper inhaler technique; Benefits of consistent maintenance inhaler use When to use rescue inhaler Differences between maintenance and rescue inhalers -Recommended to continue current medication Recommended consistent use of maintenance inhaler  GERD (Goal: Minimize symptoms) -Controlled -Current treatment  Pantoprazole 40mg  twice daily -Medications previously tried: none noted -Denies symptoms, reports correct medication administration -Recommended to continue current medication   Patient Goals/Self-Care Activities Patient will:  - take medications as prescribed focus on medication adherence by fill dates check blood pressure when symptomatic, document, and provide at future appointments target a minimum of 150 minutes of moderate intensity exercise weekly  Follow Up Plan: The care management team will reach out to  the patient again over the next 120 days.         Patient verbalizes understanding of instructions provided  today and agrees to view in [place].  Telephone follow up appointment with pharmacy team member scheduled for: 4 months  Edythe Clarity, [place]

## 2021-03-31 NOTE — Telephone Encounter (Signed)
Spoke with  pt she is feeling better nausea and still coughing alyssa send her Zofran and tessalon

## 2021-04-06 ENCOUNTER — Other Ambulatory Visit: Payer: Self-pay

## 2021-04-06 ENCOUNTER — Ambulatory Visit (INDEPENDENT_AMBULATORY_CARE_PROVIDER_SITE_OTHER): Payer: Medicare Other | Admitting: Nurse Practitioner

## 2021-04-06 ENCOUNTER — Encounter: Payer: Self-pay | Admitting: Nurse Practitioner

## 2021-04-06 VITALS — BP 100/50 | HR 79 | Temp 97.8°F | Resp 16 | Ht 62.5 in | Wt 96.6 lb

## 2021-04-06 DIAGNOSIS — R110 Nausea: Secondary | ICD-10-CM

## 2021-04-06 DIAGNOSIS — R052 Subacute cough: Secondary | ICD-10-CM | POA: Diagnosis not present

## 2021-04-06 MED ORDER — PROMETHAZINE HCL 12.5 MG PO TABS: 12.5000 mg | ORAL_TABLET | Freq: Four times a day (QID) | ORAL | 0 refills | Status: AC | PRN

## 2021-04-06 NOTE — Progress Notes (Signed)
[facility] [place], [place] [address]  Internal MEDICINE  Office Visit Note  Patient Name: Olivia Lane  [id number]  [id number]  Date of Service: 04/06/2021  Chief Complaint  Patient presents with   Acute Visit    Pt had covid 2 weeks ago    Nausea   Fatigue     HPI Jamey presents for an acute sick visit for nausea. She had covid 2 weeks ago and had a virtual telehealth visit for that. She was prescribed paxlovid, benzonatate and a prednisone taper. She reports lingering cough and nausea since her covid infection resolved. She was already prescribed zofran for nausea on 03/31/21 and an additional prescription of benzonatate for cough.      Current Medication:  Outpatient Encounter Medications as of 04/06/2021  Medication Sig   amLODipine (NORVASC) 10 MG tablet TAKE ONE TABLET BY MOUTH EVERY NIGHT FORHYPERTENSION   atorvastatin (LIPITOR) 40 MG tablet TAKE 1 TABLET BY MOUTH AT BEDTIME   benzonatate (TESSALON) 100 MG capsule Take 1 capsule (100 mg total) by mouth 2 (two) times daily as needed for cough.   budesonide-formoterol (SYMBICORT) 160-4.5 MCG/ACT inhaler Inhale 2 puffs into the lungs 2 (two) times daily. Inhale 2 puffs into lungs 2 (two) times daily PLEASE USE NAME BRAND: SYMBICORT   clopidogrel (PLAVIX) 75 MG tablet TAKE 1 TABLET BY MOUTH ONCE A DAY   metoprolol tartrate (LOPRESSOR) 50 MG tablet TAKE 1 TABLET BY MOUTH EVERY 12 HOURS   nitroGLYCERIN (NITROSTAT) 0.4 MG SL tablet Place under the tongue.   ondansetron (ZOFRAN) 4 MG tablet Take 1 tablet (4 mg total) by mouth every 8 (eight) hours as needed for nausea or vomiting.   pantoprazole (PROTONIX) 40 MG tablet TAKE 1 TABLET BY MOUTH TWICE A DAY   predniSONE (DELTASONE) 10 MG tablet Take one tab 3 x day for 3 days, then take one tab 2 x a day for 3 days and then take one tab a day for 3 days for uri   PROAIR HFA 108 (90 Base) MCG/ACT inhaler Inhale 1-2 puffs into the lungs every 6  (six) hours as needed for wheezing or shortness of breath.   promethazine (PHENERGAN) 12.5 MG tablet Take 1 tablet (12.5 mg total) by mouth every 6 (six) hours as needed for nausea or vomiting.   No facility-administered encounter medications on file as of 04/06/2021.      Medical History: Past Medical History:  Diagnosis Date   Anemia    Gastro-esophageal reflux    Hyperlipidemia    Hypertension      Vital Signs: BP (!) 100/50   Pulse 79   Temp 97.8 F (36.6 C)   Resp 16   Ht 5' 2.5" (1.588 m)   Wt 96 lb 9.6 oz (43.8 kg)   SpO2 94%   BMI 17.39 kg/m    Review of Systems  Constitutional:  Negative for chills, fatigue and unexpected weight change.  HENT:  Negative for congestion, rhinorrhea, sneezing and sore throat.   Eyes:  Negative for redness.  Respiratory:  Positive for cough. Negative for chest tightness, shortness of breath and wheezing.   Cardiovascular:  Negative for chest pain and palpitations.  Gastrointestinal:  Positive for nausea. Negative for abdominal pain, constipation, diarrhea and vomiting.  Genitourinary:  Negative for dysuria and frequency.  Musculoskeletal:  Negative for arthralgias, back pain, joint swelling and neck pain.  Skin:  Negative for rash.  Neurological: Negative.  Negative for tremors and numbness.  Hematological:  Negative for adenopathy. Does not bruise/bleed easily.  Psychiatric/Behavioral:  Negative for behavioral problems (Depression), self-injury, sleep disturbance and suicidal ideas. The patient is not nervous/anxious.    Physical Exam Vitals reviewed.  Constitutional:      General: She is not in acute distress.    Appearance: Normal appearance. She is normal weight. She is not ill-appearing.  HENT:     Head: Normocephalic and atraumatic.  Eyes:     Extraocular Movements: Extraocular movements intact.     Pupils: Pupils are equal, round, and reactive to light.  Cardiovascular:     Rate and Rhythm: Normal rate and regular  rhythm.  Pulmonary:     Effort: Pulmonary effort is normal. No respiratory distress.  Neurological:     Mental Status: She is alert and oriented to person, place, and time.     Cranial Nerves: No cranial nerve deficit.     Coordination: Coordination normal.     Gait: Gait normal.  Psychiatric:        Mood and Affect: Mood normal.        Behavior: Behavior normal.      Assessment/Plan: 1. Nausea Promethazine prescribed for severe nausea.  - promethazine (PHENERGAN) 12.5 MG tablet; Take 1 tablet (12.5 mg total) by mouth every 6 (six) hours as needed for nausea or vomiting.  Dispense: 30 tablet; Refill: 0  2. Subacute cough Prescription for promethazine may also help her cough, she also has left over benzonatate from the last refill.    General Counseling: jashay roddy understanding of the findings of todays visit and agrees with plan of treatment. I have discussed any further diagnostic evaluation that may be needed or ordered today. We also reviewed her medications today. she has been encouraged to call the office with any questions or concerns that should arise related to todays visit.    Counseling:    No orders of the defined types were placed in this encounter.   Meds ordered this encounter  Medications   promethazine (PHENERGAN) 12.5 MG tablet    Sig: Take 1 tablet (12.5 mg total) by mouth every 6 (six) hours as needed for nausea or vomiting.    Dispense:  30 tablet    Refill:  0    Return if symptoms worsen or fail to improve.  [place] Controlled Substance Database was reviewed by me for overdose risk score (ORS)  Time spent:20 Minutes Time spent with patient included reviewing progress notes, labs, imaging studies, and discussing plan for follow up.   This patient was seen by Jonetta Osgood, FNP-C in collaboration with Dr. Clayborn Bigness as a part of collaborative care agreement.  Brody Kump R. Valetta Fuller, MSN, FNP-C Internal Medicine

## 2021-05-09 ENCOUNTER — Telehealth: Payer: Self-pay | Admitting: Pharmacist

## 2021-05-09 ENCOUNTER — Telehealth: Payer: Self-pay | Admitting: Student-PharmD

## 2021-05-09 NOTE — Progress Notes (Signed)
Chronic Care Management Pharmacy Assistant   Name: Olivia Lane  MRN: [id number] DOB: 02-08-1936  Reason for Encounter: Disease State For HTN.    Conditions to be addressed/monitored: CAD, HTN, PAD, COPD, GERD, HLD.  Recent office visits:  04/06/21 Jonetta Osgood, NP. For nausea. STARTED Promethazine 12.5 mg every 6 hours PRN.   Recent consult visits:  None since last pharm D visit on 03/31/21  Hospital visits:  None since last pharm D visit on 03/31/21  Medications: Outpatient Encounter Medications as of 05/09/2021  Medication Sig   amLODipine (NORVASC) 10 MG tablet TAKE ONE TABLET BY MOUTH EVERY NIGHT FORHYPERTENSION   atorvastatin (LIPITOR) 40 MG tablet TAKE 1 TABLET BY MOUTH AT BEDTIME   benzonatate (TESSALON) 100 MG capsule Take 1 capsule (100 mg total) by mouth 2 (two) times daily as needed for cough.   budesonide-formoterol (SYMBICORT) 160-4.5 MCG/ACT inhaler Inhale 2 puffs into the lungs 2 (two) times daily. Inhale 2 puffs into lungs 2 (two) times daily PLEASE USE NAME BRAND: SYMBICORT   clopidogrel (PLAVIX) 75 MG tablet TAKE 1 TABLET BY MOUTH ONCE A DAY   metoprolol tartrate (LOPRESSOR) 50 MG tablet TAKE 1 TABLET BY MOUTH EVERY 12 HOURS   nitroGLYCERIN (NITROSTAT) 0.4 MG SL tablet Place under the tongue.   ondansetron (ZOFRAN) 4 MG tablet Take 1 tablet (4 mg total) by mouth every 8 (eight) hours as needed for nausea or vomiting.   pantoprazole (PROTONIX) 40 MG tablet TAKE 1 TABLET BY MOUTH TWICE A DAY   predniSONE (DELTASONE) 10 MG tablet Take one tab 3 x day for 3 days, then take one tab 2 x a day for 3 days and then take one tab a day for 3 days for uri   PROAIR HFA 108 (90 Base) MCG/ACT inhaler Inhale 1-2 puffs into the lungs every 6 (six) hours as needed for wheezing or shortness of breath.   promethazine (PHENERGAN) 12.5 MG tablet Take 1 tablet (12.5 mg total) by mouth every 6 (six) hours as needed for nausea or vomiting.   No facility-administered  encounter medications on file as of 05/09/2021.   Reviewed chart prior to disease state call. Spoke with patient regarding BP  Recent Office Vitals: BP Readings from Last 3 Encounters:  04/06/21 (!) 100/50  12/12/20 130/60  09/08/20 (!) 168/62   Pulse Readings from Last 3 Encounters:  04/06/21 79  12/12/20 60  09/08/20 67    Wt Readings from Last 3 Encounters:  04/06/21 96 lb 9.6 oz (43.8 kg)  03/22/21 105 lb (47.6 kg)  12/12/20 103 lb 6.4 oz (46.9 kg)     Kidney Function Lab Results  Component Value Date/Time   CREATININE 1.31 (H) 08/15/2018 04:51 AM   CREATININE 1.26 (H) 08/14/2018 07:55 AM   GFRNONAA 38 (L) 08/15/2018 04:51 AM   GFRAA 44 (L) 08/15/2018 04:51 AM    BMP Latest Ref Rng & Units 08/15/2018 08/14/2018 08/13/2018  Glucose 70 - 99 mg/dL 122(H) 139(H) 141(H)  BUN 8 - 23 mg/dL 22 18 19   Creatinine 0.44 - 1.00 mg/dL 1.31(H) 1.26(H) 1.51(H)  BUN/Creat Ratio 12 - 28 - - -  Sodium 135 - 145 mmol/L 141 138 136  Potassium 3.5 - 5.1 mmol/L 4.2 4.4 4.5  Chloride 98 - 111 mmol/L 115(H) 114(H) 109  CO2 22 - 32 mmol/L 20(L) 20(L) 17(L)  Calcium 8.9 - 10.3 mg/dL 8.3(L) 8.1(L) 8.6(L)    Current antihypertensive regimen:  Amlodipine 10mg  daily  Metoprolol tartrate 50mg  BID  How often are you checking your Blood Pressure? Patient stated when feeling symptomatic  Current home BP readings: Patient was not able to give me any blood pressure readings because she doesn't check her blood pressure regularly.   What recent interventions/DTPs have been made by any provider to improve Blood Pressure control since last CPP Visit: None.  Any recent hospitalizations or ED visits since last visit with CPP? Patient stated no.   What diet changes have been made to improve Blood Pressure Control?  Patient stated she lost weight from catching COVID. She stated right now she is trying to eat a little more to get her weight back to were it use to be.   What exercise is being done to  improve your Blood Pressure Control?  Patient stated she is active as much as she wants to be.  Adherence Review: Is the patient currently on ACE/ARB medication? N/A.  Does the patient have >5 day gap between last estimated fill dates? N/A.  Care Gaps:Not on the list.  Star Rating Drugs:Atorvastatin 40 mg 01/13/21 90 DS.  Follow-Up:Pharmacist Review  Charlann Lange, [place] Pharmacist Assistant [phone]

## 2021-05-09 NOTE — Progress Notes (Signed)
error 

## 2021-05-17 ENCOUNTER — Other Ambulatory Visit: Payer: Self-pay | Admitting: Physician Assistant

## 2021-05-17 DIAGNOSIS — I10 Essential (primary) hypertension: Secondary | ICD-10-CM

## 2021-05-22 ENCOUNTER — Ambulatory Visit (INDEPENDENT_AMBULATORY_CARE_PROVIDER_SITE_OTHER): Payer: Medicare Other | Admitting: Nurse Practitioner

## 2021-05-22 ENCOUNTER — Encounter (INDEPENDENT_AMBULATORY_CARE_PROVIDER_SITE_OTHER): Payer: Self-pay

## 2021-05-22 ENCOUNTER — Encounter: Payer: Self-pay | Admitting: Nurse Practitioner

## 2021-05-22 ENCOUNTER — Other Ambulatory Visit: Payer: Self-pay

## 2021-05-22 VITALS — BP 132/49 | HR 67 | Temp 98.2°F | Resp 16 | Ht 62.5 in | Wt 97.4 lb

## 2021-05-22 DIAGNOSIS — D75839 Thrombocytosis, unspecified: Secondary | ICD-10-CM

## 2021-05-22 DIAGNOSIS — E559 Vitamin D deficiency, unspecified: Secondary | ICD-10-CM

## 2021-05-22 DIAGNOSIS — Z0001 Encounter for general adult medical examination with abnormal findings: Secondary | ICD-10-CM

## 2021-05-22 DIAGNOSIS — I10 Essential (primary) hypertension: Secondary | ICD-10-CM | POA: Diagnosis not present

## 2021-05-22 DIAGNOSIS — K2100 Gastro-esophageal reflux disease with esophagitis, without bleeding: Secondary | ICD-10-CM | POA: Diagnosis not present

## 2021-05-22 DIAGNOSIS — J449 Chronic obstructive pulmonary disease, unspecified: Secondary | ICD-10-CM

## 2021-05-22 DIAGNOSIS — E782 Mixed hyperlipidemia: Secondary | ICD-10-CM

## 2021-05-22 MED ORDER — BUDESONIDE-FORMOTEROL FUMARATE 160-4.5 MCG/ACT IN AERO: 2.0000 | INHALATION_SPRAY | Freq: Two times a day (BID) | RESPIRATORY_TRACT | 12 refills | Status: AC

## 2021-05-22 MED ORDER — METOPROLOL TARTRATE 50 MG PO TABS: 50.0000 mg | ORAL_TABLET | Freq: Two times a day (BID) | ORAL | 3 refills | Status: DC

## 2021-05-22 MED ORDER — CLOPIDOGREL BISULFATE 75 MG PO TABS: 75.0000 mg | ORAL_TABLET | Freq: Every day | ORAL | 2 refills | Status: DC

## 2021-05-22 MED ORDER — ALBUTEROL SULFATE HFA 108 (90 BASE) MCG/ACT IN AERS: 2.0000 | INHALATION_SPRAY | Freq: Four times a day (QID) | RESPIRATORY_TRACT | 4 refills | Status: DC | PRN

## 2021-05-22 MED ORDER — PANTOPRAZOLE SODIUM 40 MG PO TBEC: 40.0000 mg | DELAYED_RELEASE_TABLET | Freq: Two times a day (BID) | ORAL | 3 refills | Status: DC

## 2021-05-22 NOTE — Progress Notes (Signed)
[facility] [place], [place] [address]  Internal MEDICINE  Office Visit Note  Patient Name: Olivia Lane  [id number]  [id number]  Date of Service: 06/20/2021  Chief Complaint  Patient presents with   Medicare Wellness   Gastroesophageal Reflux   Hypertension   Hyperlipidemia   Anemia    HPI Olivia Lane presents for an annual well visit and physical exam.  She is a well-appearing 85 year old female.  She has no preventive screenings due at this time she has chronic problems including hypertension peripheral arterial disease, COPD, GERD.  She is a former smoker.  At her previous office visit she was seen for lingering nausea and cough post COVID infection she seems to be doing better today per patient report.  She takes Symbicort for her COPD and metoprolol for atrial fibrillation and hypertension.  She is due for routine lab work which will be ordered today.    Current Medication: Outpatient Encounter Medications as of 05/22/2021  Medication Sig   albuterol (VENTOLIN HFA) 108 (90 Base) MCG/ACT inhaler Inhale 2 puffs into the lungs every 6 (six) hours as needed for wheezing or shortness of breath.   amLODipine (NORVASC) 10 MG tablet TAKE 1 TABLET BY MOUTH EVERY NIGHT AT BEDTIME FOR HYPERTENSION.   atorvastatin (LIPITOR) 40 MG tablet TAKE 1 TABLET BY MOUTH AT BEDTIME   benzonatate (TESSALON) 100 MG capsule Take 1 capsule (100 mg total) by mouth 2 (two) times daily as needed for cough.   nitroGLYCERIN (NITROSTAT) 0.4 MG SL tablet Place under the tongue.   ondansetron (ZOFRAN) 4 MG tablet Take 1 tablet (4 mg total) by mouth every 8 (eight) hours as needed for nausea or vomiting.   promethazine (PHENERGAN) 12.5 MG tablet Take 1 tablet (12.5 mg total) by mouth every 6 (six) hours as needed for nausea or vomiting.   [DISCONTINUED] budesonide-formoterol (SYMBICORT) 160-4.5 MCG/ACT inhaler Inhale 2 puffs into the lungs 2 (two) times daily. Inhale 2 puffs into  lungs 2 (two) times daily PLEASE USE NAME BRAND: SYMBICORT   [DISCONTINUED] clopidogrel (PLAVIX) 75 MG tablet TAKE 1 TABLET BY MOUTH ONCE A DAY   [DISCONTINUED] metoprolol tartrate (LOPRESSOR) 50 MG tablet TAKE 1 TABLET BY MOUTH EVERY 12 HOURS   [DISCONTINUED] pantoprazole (PROTONIX) 40 MG tablet TAKE 1 TABLET BY MOUTH TWICE A DAY   [DISCONTINUED] predniSONE (DELTASONE) 10 MG tablet Take one tab 3 x day for 3 days, then take one tab 2 x a day for 3 days and then take one tab a day for 3 days for uri   [DISCONTINUED] PROAIR HFA 108 (90 Base) MCG/ACT inhaler Inhale 1-2 puffs into the lungs every 6 (six) hours as needed for wheezing or shortness of breath.   budesonide-formoterol (SYMBICORT) 160-4.5 MCG/ACT inhaler Inhale 2 puffs into the lungs 2 (two) times daily. Inhale 2 puffs into lungs 2 (two) times daily PLEASE USE NAME BRAND: SYMBICORT   clopidogrel (PLAVIX) 75 MG tablet Take 1 tablet (75 mg total) by mouth daily.   metoprolol tartrate (LOPRESSOR) 50 MG tablet Take 1 tablet (50 mg total) by mouth every 12 (twelve) hours.   pantoprazole (PROTONIX) 40 MG tablet Take 1 tablet (40 mg total) by mouth 2 (two) times daily.   No facility-administered encounter medications on file as of 05/22/2021.    Surgical History: Past Surgical History:  Procedure Laterality Date   APPENDECTOMY  2004   BYPASS GRAFT  2014   CATARACT EXTRACTION  2006,2004   COLONOSCOPY  2004  Medical History: Past Medical History:  Diagnosis Date   Anemia    Gastro-esophageal reflux    Hyperlipidemia    Hypertension     Family History: Family History  Problem Relation Age of Onset   Hypertension Father    Coronary artery disease Sister    Hypertension Sister    Diabetes Brother    Hypertension Brother     Social History   Socioeconomic History   Marital status: Single    Spouse name: Not on file   Number of children: Not on file   Years of education: Not on file   Highest education level: Not on file   Occupational History   Not on file  Tobacco Use   Smoking status: Former    Packs/day: 1.00    Years: 40.00    Pack years: 40.00    Types: Cigarettes    Quit date: 03/24/2013    Years since quitting: 8.2   Smokeless tobacco: Never  Vaping Use   Vaping Use: Never used  Substance and Sexual Activity   Alcohol use: No   Drug use: No   Sexual activity: Not on file  Other Topics Concern   Not on file  Social History Narrative   Not on file   Social Determinants of Health   Financial Resource Strain: Low Risk    Difficulty of Paying Living Expenses: Not hard at all  Food Insecurity: Not on file  Transportation Needs: Not on file  Physical Activity: Not on file  Stress: Not on file  Social Connections: Not on file  Intimate Partner Violence: Not on file      Review of Systems  Constitutional:  Negative for activity change, appetite change, chills, fatigue, fever and unexpected weight change.  HENT: Negative.  Negative for congestion, ear pain, rhinorrhea, sore throat and trouble swallowing.   Eyes: Negative.   Respiratory: Negative.  Negative for cough, chest tightness, shortness of breath and wheezing.   Cardiovascular: Negative.  Negative for chest pain.  Gastrointestinal: Negative.  Negative for abdominal pain, blood in stool, constipation, diarrhea, nausea and vomiting.  Endocrine: Negative.   Genitourinary: Negative.  Negative for difficulty urinating, dysuria, frequency, hematuria and urgency.  Musculoskeletal: Negative.  Negative for arthralgias, back pain, joint swelling, myalgias and neck pain.  Skin: Negative.  Negative for rash and wound.  Allergic/Immunologic: Negative.  Negative for immunocompromised state.  Neurological: Negative.  Negative for dizziness, seizures, numbness and headaches.  Hematological: Negative.   Psychiatric/Behavioral: Negative.  Negative for behavioral problems, self-injury and suicidal ideas. The patient is not nervous/anxious.     Vital Signs: BP (!) 132/49   Pulse 67   Temp 98.2 F (36.8 C)   Resp 16   Ht 5' 2.5" (1.588 m)   Wt 97 lb 6.4 oz (44.2 kg)   SpO2 98%   BMI 17.53 kg/m    Physical Exam Vitals reviewed.  Constitutional:      General: She is awake. She is not in acute distress.    Appearance: Normal appearance. She is well-developed and underweight. She is not ill-appearing or diaphoretic.  HENT:     Head: Normocephalic and atraumatic.     Right Ear: Tympanic membrane, ear canal and external ear normal.     Left Ear: Tympanic membrane, ear canal and external ear normal.     Nose: Nose normal. No congestion or rhinorrhea.     Mouth/Throat:     Mouth: Mucous membranes are moist.     Pharynx: Oropharynx  is clear. No oropharyngeal exudate or posterior oropharyngeal erythema.  Eyes:     General: Lids are normal. Vision grossly intact. Gaze aligned appropriately. No scleral icterus.       Right eye: No discharge.        Left eye: No discharge.     Extraocular Movements: Extraocular movements intact.     Conjunctiva/sclera: Conjunctivae normal.     Pupils: Pupils are equal, round, and reactive to light.     Funduscopic exam:    Right eye: Red reflex present.        Left eye: Red reflex present. Neck:     Thyroid: No thyromegaly.     Vascular: No carotid bruit or JVD.     Trachea: No tracheal deviation.  Cardiovascular:     Rate and Rhythm: Normal rate and regular rhythm.     Pulses: Normal pulses.     Heart sounds: Murmur heard.    No friction rub. No gallop.  Pulmonary:     Effort: Pulmonary effort is normal. No accessory muscle usage or respiratory distress.     Breath sounds: Normal breath sounds and air entry. No stridor. No wheezing or rales.  Chest:     Chest wall: No tenderness.     Comments: Gets yearly mammograms, clinical breast exam declined Abdominal:     General: Bowel sounds are normal. There is no distension.     Palpations: Abdomen is soft. There is no shifting  dullness, fluid wave, mass or pulsatile mass.     Tenderness: There is no abdominal tenderness. There is no guarding or rebound.  Musculoskeletal:        General: No tenderness or deformity. Normal range of motion.     Cervical back: Normal range of motion and neck supple.  Lymphadenopathy:     Cervical: No cervical adenopathy.  Skin:    General: Skin is warm and dry.     Capillary Refill: Capillary refill takes less than 2 seconds.     Coloration: Skin is not pale.     Findings: No erythema or rash.  Neurological:     Mental Status: She is alert and oriented to person, place, and time.     Cranial Nerves: No cranial nerve deficit.     Motor: No abnormal muscle tone.     Coordination: Coordination normal.     Gait: Gait normal.     Deep Tendon Reflexes: Reflexes are normal and symmetric.  Psychiatric:        Mood and Affect: Mood and affect normal.        Behavior: Behavior normal. Behavior is cooperative.        Thought Content: Thought content normal.        Judgment: Judgment normal.       Assessment/Plan: 1. Encounter for general adult medical examination with abnormal findings Age-appropriate preventive screenings and vaccinations discussed, annual physical exam completed. Routine labs for health maintenance ordered, see below. PHM updated.   2. Chronic obstructive pulmonary disease, unspecified COPD type ([place]) Stable, continue symbicort as prescribed, rescue inhaler refill ordered. - budesonide-formoterol (SYMBICORT) 160-4.5 MCG/ACT inhaler; Inhale 2 puffs into the lungs 2 (two) times daily. Inhale 2 puffs into lungs 2 (two) times daily PLEASE USE NAME BRAND: SYMBICORT  Dispense: 1 each; Refill: 12 - albuterol (VENTOLIN HFA) 108 (90 Base) MCG/ACT inhaler; Inhale 2 puffs into the lungs every 6 (six) hours as needed for wheezing or shortness of breath.  Dispense: 8 g; Refill: 4 - CMP14+EGFR  3. Essential hypertension Stable with current medication, routine labs ordered -  metoprolol tartrate (LOPRESSOR) 50 MG tablet; Take 1 tablet (50 mg total) by mouth every 12 (twelve) hours.  Dispense: 60 tablet; Refill: 3 - CMP14+EGFR - TSH + free T4  4. Gastroesophageal reflux disease with esophagitis, unspecified whether hemorrhage Pantoprazole refills ordered, routine lab ordered - pantoprazole (PROTONIX) 40 MG tablet; Take 1 tablet (40 mg total) by mouth 2 (two) times daily.  Dispense: 60 tablet; Refill: 3 - CMP14+EGFR  5. Mixed hyperlipidemia Routine labs ordered - CMP14+EGFR - Lipid Profile - TSH + free T4  6. Thrombocytosis On plavix, routine labs ordered - clopidogrel (PLAVIX) 75 MG tablet; Take 1 tablet (75 mg total) by mouth daily.  Dispense: 90 tablet; Refill: 2 - CBC with Differential/Platelet - TSH + free T4  7. Vitamin D deficiency Routine lab ordered - Vitamin D (25 hydroxy)     General Counseling: teyah rossy understanding of the findings of todays visit and agrees with plan of treatment. I have discussed any further diagnostic evaluation that may be needed or ordered today. We also reviewed her medications today. she has been encouraged to call the office with any questions or concerns that should arise related to todays visit.    Orders Placed This Encounter  Procedures   CBC with Differential/Platelet   CMP14+EGFR   Lipid Profile   TSH + free T4   Vitamin D (25 hydroxy)    Meds ordered this encounter  Medications   budesonide-formoterol (SYMBICORT) 160-4.5 MCG/ACT inhaler    Sig: Inhale 2 puffs into the lungs 2 (two) times daily. Inhale 2 puffs into lungs 2 (two) times daily PLEASE USE NAME BRAND: SYMBICORT    Dispense:  1 each    Refill:  12   pantoprazole (PROTONIX) 40 MG tablet    Sig: Take 1 tablet (40 mg total) by mouth 2 (two) times daily.    Dispense:  60 tablet    Refill:  3   metoprolol tartrate (LOPRESSOR) 50 MG tablet    Sig: Take 1 tablet (50 mg total) by mouth every 12 (twelve) hours.    Dispense:  60  tablet    Refill:  3   clopidogrel (PLAVIX) 75 MG tablet    Sig: Take 1 tablet (75 mg total) by mouth daily.    Dispense:  90 tablet    Refill:  2   albuterol (VENTOLIN HFA) 108 (90 Base) MCG/ACT inhaler    Sig: Inhale 2 puffs into the lungs every 6 (six) hours as needed for wheezing or shortness of breath.    Dispense:  8 g    Refill:  4    Patient will call pharmacy when she needs a refill.    Return in about 6 months (around 11/19/2021) for F/U, med refill, Reise Gladney PCP.   Total time spent:30 Minutes Time spent includes review of chart, medications, test results, and follow up plan with the patient.   [place] Controlled Substance Database was reviewed by me.  This patient was seen by Jonetta Osgood, FNP-C in collaboration with Dr. Clayborn Bigness as a part of collaborative care agreement.  Yao [place] R. Valetta Fuller, MSN, FNP-C Internal medicine

## 2021-05-31 DIAGNOSIS — J449 Chronic obstructive pulmonary disease, unspecified: Secondary | ICD-10-CM | POA: Diagnosis not present

## 2021-05-31 DIAGNOSIS — I10 Essential (primary) hypertension: Secondary | ICD-10-CM | POA: Diagnosis not present

## 2021-05-31 DIAGNOSIS — I2510 Atherosclerotic heart disease of native coronary artery without angina pectoris: Secondary | ICD-10-CM | POA: Diagnosis not present

## 2021-06-20 ENCOUNTER — Encounter: Payer: Self-pay | Admitting: Nurse Practitioner

## 2021-08-07 ENCOUNTER — Telehealth: Payer: Self-pay | Admitting: Student-PharmD

## 2021-08-07 NOTE — Progress Notes (Addendum)
Hypertension (HTN) Review Call  [facility] A [id number] 89 years, Female  DOB: 12-04-1935  M: [phone]  Hypertension Review (HC) Completed by Charlann Lange on 08/07/2021  Chart Review Is the patient enrolled in RPM with BP Monitor?: No  BP #1 reading (last): 132/49 on: 05/22/2021  BP #2 reading: 100/50 on: 04/05/2021  BP #3 reading: 130/60 on: 12/11/2020  Any of the last 3 BP > 140/90 mmHg?: No  What recent interventions/DTPs have been made by any provider to improve the patient's conditions in the last 3 months?:   Office Visit: 05/22/21 Jonetta Osgood, NP. For Medicare wellness. STOPPED Prednisone STARTED Albuterol to 2 puffs every 6 hours PRN.  Any recent hospitalizations or ED visits since last visit with CPP?: No  Adherence Review Adherence rates for STAR metric medications: Atorvastatin 40 mg 01/13/21 90 DS  Adherence rates for medications indicated for disease state being reviewed: None.  Does the patient have >5 day gap between last estimated fill dates for any of the above medications?: Yes  Reasons for medication gaps: Atorvastatin 40 mg 01/13/21 90 DS-Unknown.  Disease State Questions Able to connect with the Patient?: Yes  Is the patient monitoring his/her BP?: No  Review recommendations from CPP's note of how often patient should be checking and encourage monitoring blood pressures if patient has history of high BP.: Done  What is your blood pressure goal?: 120/80  Educate patient to inform proper points on checking BP at home:: Do not drink  caffeine or smoke a cigarette at least 30 min. prior to checking., When taking resting blood pressure: sit quietly for 5 minutes, not within 30 min. of exercising, no talking.  What diet changes have you made to improve your Blood Pressure Control?: eating more home-cooked meals, limiting / monitoring salt intake  What exercise are you doing to improve your Blood Pressure Control?:  walking  Engagement Notes Charlann Lange on 08/07/2021 02:42 PM F/U appointment on 09/27/21 at 8:30 AM McLemore, Veronica on 08/04/2021 01:36 PM [facility] Chart Review: 15 min 08/04/21 [facility] Assessment call time spent: 5 min 08/07/21    Charlann Lange, [place]  [phone]  Pharmacist Review  Adherence gaps identified?: No Drug Therapy Problems identified?: No Assessment: Controlled  Alena Bills Clinical Pharmacist

## 2021-08-09 ENCOUNTER — Other Ambulatory Visit: Payer: Self-pay | Admitting: Internal Medicine

## 2021-08-29 DIAGNOSIS — I2510 Atherosclerotic heart disease of native coronary artery without angina pectoris: Secondary | ICD-10-CM | POA: Diagnosis not present

## 2021-08-29 DIAGNOSIS — J449 Chronic obstructive pulmonary disease, unspecified: Secondary | ICD-10-CM | POA: Diagnosis not present

## 2021-08-29 DIAGNOSIS — I10 Essential (primary) hypertension: Secondary | ICD-10-CM | POA: Diagnosis not present

## 2021-09-27 ENCOUNTER — Ambulatory Visit: Payer: Medicare Other | Admitting: Student-PharmD

## 2021-09-27 ENCOUNTER — Other Ambulatory Visit: Payer: Self-pay

## 2021-09-27 ENCOUNTER — Telehealth: Payer: Medicare Other

## 2021-09-27 DIAGNOSIS — E782 Mixed hyperlipidemia: Secondary | ICD-10-CM

## 2021-09-27 DIAGNOSIS — J449 Chronic obstructive pulmonary disease, unspecified: Secondary | ICD-10-CM

## 2021-09-27 NOTE — Progress Notes (Unsigned)
Follow Up Pharmacist Visit  ? ?Lane,Olivia A [id number] ?86 years, Female  DOB: Nov 28, 1935  M: [phone] ? ?Clinical Summary ?Patient Risk: Moderate ?Next CCM Follow Up: 06/06/22 ?Next AWV: 05/08/22 ?Summary for PCP: Patient reports her breathing has been very well-controlled as of late, with no need to use albuterol inhaler and she has not been using her symbicort inhaler. Counseled patient on importance of maintenance inhaler use. Patient has not had lipid panel check since 2019 and has active orders. Reminded patient to go get labwork done. Patient has been taking 1/2 tab of her atorvastatin, 4-5x/week to avoid muscle aches. Cannot monitor efficacy until labwork is completed. ? ?Patient Chart Prep  ?Completed by Charlann Lange on 09/25/2021 ? ?Chronic Conditions ?Patient's Chronic Conditions: Hypertension (HTN), Atrial Fibrillation, Chronic Obstructive Pulmonary Disease (COPD), Gastroesophageal Reflux Disease (GERD), Cardiovascular Disease (CVD), Hyperlipidemia/Dyslipidemia (HLD) ? ?Doctor and Hospital Visits ?Were there PCP Visits since last visit with the Pharmacist?: No ?Were there Specialist Visits since last visit with the Pharmacist?: No ?Was there a Hospital Visit in last 30 days?: No ?Were there other Hospital Visits since last visit with the Pharmacist?: No ? ?Medication Information ?Have there been any medication changes from PCP or Specialist since last visit with the Pharmacist?: No ?Are there any Medication adherence gaps (beyond 5 days past due)?: Yes ?Details: Atorvastatin 40 mg 05/05/21 90 DS ?Medication adherence rates for the STAR rating drugs: Atorvastatin 40 mg 05/05/21 90 DS ?List Patient's current Care Gaps: No current Care Gaps identified ? ?Pre-Call Questions  ?Completed by Charlann Lange on 09/25/2021 ? ?Are you able to connect with Patient: Yes ?Confirmed appointment date/time with patient/caregiver?: Yes ?Date/time of the appointment: 09/27/21 at 8-830 AM ?Visit type:  Phone ?Patient/Caregiver instructed to bring medications to appointment: Yes ?What, if any, problems do you have getting your medications from the pharmacy?: None ?What is your top health concern to discuss at your upcoming visit?: Patient stated none. ?Have you seen any other providers since your last visit?: No ? ?Disease Assessments ? ?Subjective Information ?Current BP: 132/49 ?Current HR: 67 ?taken on: 05/22/2021 ?Weight: 97 ?BMI: 17.53 ?Last GFR: 38 ?taken on: 08/15/2018 ?Visit Completed on: 09/27/2021 ?Why did the patient present?: CCM F/U Visit ?Factors that may affect medication adherence?: Pill burden ?Is Patient using [facility]?: No ?Name and location of Current pharmacy: [place] ?Current Rx insurance plan: UHC ?Are meds synced by current pharmacy?: No ?Are meds delivered by current pharmacy?: No - delivery available but patient prefers to not use ?Would patient benefit from direct intervention of clinical lead in dispensing process to optimize clinical outcomes?: Yes ?Are [facility] pharmacy services available where patient lives?: Yes ?Is patient disadvantaged to use [facility]?: No ?[facility] services reviewed with patient and patient wishes to change pharmacy?: No ?Select reason patient declined to change pharmacies: Loyalty to current pharmacy ?Does patient experience delays in picking up medications due to transportation concerns (getting to pharmacy)?: No ?Any additional demeanor/mood notes?: Patient presents via phone and is very pleasant to speak with. ? ?Hyperlipidemia/Dyslipidemia (HLD) ?Last Lipid panel on: 07/08/2017 ?TC (Goal<200): 145 ?LDL: 82 ?HDL (Goal>40): 42 ?TG (Goal<150): 105 ?ASCVD 10-year risk?is:: N/A due to Age > 64 ?Assess this condition today?: Yes ?LDL Goal: <100 ?Has patient tried and failed any HLD Medications?: No ?We discussed: How to reduce cholesterol through diet/weight management and physical activity. ?Assessment:: Uncontrolled ?Drug:  Atorvastatin '40mg'$  -1/2 tab by mouth 4-5x/week ?Assessment: Appropriate, Query Effectiveness ?Additional Info: Patient has not had lipid panel  since 2019. Has an active order for lab draw from 05/2021. Reminded patient to obtain this lab work. Patient states she started halving the tab and taking 4-5x/week to help avoid muscle aches. ?Plan to: Continue medication therapy ?HC Follow up: 4 months ?Pharmacist Follow up: 9 months ? ?Chronic Obstructive Pulmonary Disease (COPD) ?Current Eosinophils: 0.5 ?taken on: 08/13/2018 ?Assess this condition today?: Yes ?Exacerbations since last visit with pharmacist?: No ?Frequency of SABA/SAMA use: Infrequently ?We discussed: Inhaler technique ?Assessment:: Controlled ?Drug: Symbicort 160 2 puffs twice daily ?Assessment: Appropriate, Effective, Safe, Accessible ?Drug: Albuterol 2 puffs every 6 hours as needed ?Assessment: Appropriate, Effective, Safe, Accessible ?Additional Info: Patient states she has not been regularly using her symbicort as her breathing has been well-controlled and has not had to use her albuterol at all. Counseled patient that symbicort is a maintenance inhaler and should be used as such. Patient expressed understanding. ?Plan to: Continue medication therapy ?HC Follow up: 4 months ?Pharmacist Follow up: 9 months ? ?Exercise, Diet and Non-Drug Coordination Needs ?Additional exercise counseling points. We discussed: decreasing sedentary behavior ?Discussed Non-Drug Care Coordination Needs: Yes ?Does Patient have Medication financial barriers?: No ? ?Accountable Health Communities Health-Related Social Needs Screening Tool -  ?SDOH  ?What is your living situation today? (ref #1): I have a steady place to live ?Think about the place you live. Do you have problems with any of the following? (ref #2): None of the above ?Within the past 12 months, you worried that your food would run out before you got money to buy more (ref #3): Never true ?Within the past 12  months, the food you bought just didn't last and you didn't have money to get more (ref #4): Never true ?In the past 12 months, has lack of reliable transportation kept you from medical appointments, meetings, work or from getting things needed for daily living? (ref #5): No ?In the past 12 months, has the electric, gas, oil, or water company threatened to shut off services in your home? (ref #6): No ?How often does anyone, including family and friends, physically hurt you? (ref #7): Never (1) ?How often does anyone, including family and friends, insult or talk down to you? (ref #8): Never (1) ?How often does anyone, including friends and family, threaten you with harm? (ref #9): Never (1) ?How often does anyone, including family and friends, scream or curse at you? (ref #10): Never (1) ? ?Engagement Notes ?HC Chart Review: 10 min 09/25/21 ?[facility] Assessment call time spent: 10 min 09/25/21 ?CP Chart Prep: 12 min 09/11/21 ?CP Office Visit: 20 min 09/27/21 ?CP Office Visit Documentation: 10 min 09/27/21 ? ?

## 2021-11-13 ENCOUNTER — Other Ambulatory Visit: Payer: Self-pay | Admitting: Nurse Practitioner

## 2021-11-13 DIAGNOSIS — E782 Mixed hyperlipidemia: Secondary | ICD-10-CM | POA: Diagnosis not present

## 2021-11-13 DIAGNOSIS — Z0001 Encounter for general adult medical examination with abnormal findings: Secondary | ICD-10-CM | POA: Diagnosis not present

## 2021-11-13 DIAGNOSIS — E559 Vitamin D deficiency, unspecified: Secondary | ICD-10-CM | POA: Diagnosis not present

## 2021-11-13 DIAGNOSIS — I10 Essential (primary) hypertension: Secondary | ICD-10-CM | POA: Diagnosis not present

## 2021-11-14 ENCOUNTER — Encounter: Payer: Self-pay | Admitting: Nurse Practitioner

## 2021-11-14 ENCOUNTER — Ambulatory Visit (INDEPENDENT_AMBULATORY_CARE_PROVIDER_SITE_OTHER): Payer: Medicare Other | Admitting: Nurse Practitioner

## 2021-11-14 VITALS — BP 140/65 | HR 65 | Temp 98.0°F | Resp 16 | Ht 62.5 in | Wt 100.0 lb

## 2021-11-14 DIAGNOSIS — I739 Peripheral vascular disease, unspecified: Secondary | ICD-10-CM

## 2021-11-14 DIAGNOSIS — I10 Essential (primary) hypertension: Secondary | ICD-10-CM | POA: Diagnosis not present

## 2021-11-14 DIAGNOSIS — D75839 Thrombocytosis, unspecified: Secondary | ICD-10-CM

## 2021-11-14 DIAGNOSIS — J449 Chronic obstructive pulmonary disease, unspecified: Secondary | ICD-10-CM | POA: Diagnosis not present

## 2021-11-14 DIAGNOSIS — K2100 Gastro-esophageal reflux disease with esophagitis, without bleeding: Secondary | ICD-10-CM | POA: Diagnosis not present

## 2021-11-14 DIAGNOSIS — E559 Vitamin D deficiency, unspecified: Secondary | ICD-10-CM

## 2021-11-14 LAB — COMPREHENSIVE METABOLIC PANEL
ALT: 7 IU/L (ref 0–32)
AST: 15 IU/L (ref 0–40)
Albumin/Globulin Ratio: 1.4 (ref 1.2–2.2)
Albumin: 4 g/dL (ref 3.6–4.6)
Alkaline Phosphatase: 123 IU/L — ABNORMAL HIGH (ref 44–121)
BUN/Creatinine Ratio: 13 (ref 12–28)
BUN: 19 mg/dL (ref 8–27)
Bilirubin Total: 0.2 mg/dL (ref 0.0–1.2)
CO2: 23 mmol/L (ref 20–29)
Calcium: 9.5 mg/dL (ref 8.7–10.3)
Chloride: 108 mmol/L — ABNORMAL HIGH (ref 96–106)
Creatinine, Ser: 1.41 mg/dL — ABNORMAL HIGH (ref 0.57–1.00)
Globulin, Total: 2.8 g/dL (ref 1.5–4.5)
Glucose: 70 mg/dL (ref 70–99)
Potassium: 4.8 mmol/L (ref 3.5–5.2)
Sodium: 142 mmol/L (ref 134–144)
Total Protein: 6.8 g/dL (ref 6.0–8.5)
eGFR: 37 mL/min/{1.73_m2} — ABNORMAL LOW (ref >59)

## 2021-11-14 LAB — CBC
Hematocrit: 41.5 % (ref 34.0–46.6)
Hemoglobin: 12.8 g/dL (ref 11.1–15.9)
MCH: 26.5 pg — ABNORMAL LOW (ref 26.6–33.0)
MCHC: 30.8 g/dL — ABNORMAL LOW (ref 31.5–35.7)
MCV: 86 fL (ref 79–97)
Platelets: 429 10*3/uL (ref 150–450)
RBC: 4.83 x10E6/uL (ref 3.77–5.28)
RDW: 14.6 % (ref 11.7–15.4)
WBC: 8 10*3/uL (ref 3.4–10.8)

## 2021-11-14 LAB — VITAMIN D 25 HYDROXY (VIT D DEFICIENCY, FRACTURES): Vit D, 25-Hydroxy: 14.6 ng/mL — ABNORMAL LOW (ref 30.0–100.0)

## 2021-11-14 LAB — LIPID PANEL W/O CHOL/HDL RATIO
Cholesterol, Total: 195 mg/dL (ref 100–199)
HDL: 53 mg/dL (ref >39)
LDL Chol Calc (NIH): 119 mg/dL — ABNORMAL HIGH (ref 0–99)
Triglycerides: 129 mg/dL (ref 0–149)
VLDL Cholesterol Cal: 23 mg/dL (ref 5–40)

## 2021-11-14 LAB — T4, FREE: Free T4: 1.4 ng/dL (ref 0.82–1.77)

## 2021-11-14 LAB — TSH: TSH: 3.73 u[IU]/mL (ref 0.450–4.500)

## 2021-11-14 MED ORDER — PANTOPRAZOLE SODIUM 40 MG PO TBEC: 40.0000 mg | DELAYED_RELEASE_TABLET | Freq: Two times a day (BID) | ORAL | 3 refills | Status: AC

## 2021-11-14 MED ORDER — ALBUTEROL SULFATE HFA 108 (90 BASE) MCG/ACT IN AERS: 2.0000 | INHALATION_SPRAY | Freq: Four times a day (QID) | RESPIRATORY_TRACT | 4 refills | Status: AC | PRN

## 2021-11-14 MED ORDER — METOPROLOL TARTRATE 50 MG PO TABS: 50.0000 mg | ORAL_TABLET | Freq: Two times a day (BID) | ORAL | 3 refills | Status: AC

## 2021-11-14 MED ORDER — CLOPIDOGREL BISULFATE 75 MG PO TABS: 75.0000 mg | ORAL_TABLET | Freq: Every day | ORAL | 3 refills | Status: AC

## 2021-11-14 MED ORDER — AMLODIPINE BESYLATE 10 MG PO TABS: ORAL_TABLET | ORAL | 3 refills | Status: AC

## 2021-11-14 MED ORDER — VITAMIN D (ERGOCALCIFEROL) 1.25 MG (50000 UNIT) PO CAPS: 50000.0000 [IU] | ORAL_CAPSULE | ORAL | 1 refills | Status: AC

## 2021-11-14 MED ORDER — ATORVASTATIN CALCIUM 40 MG PO TABS: 40.0000 mg | ORAL_TABLET | Freq: Every day | ORAL | 3 refills | Status: AC

## 2021-11-14 NOTE — Progress Notes (Signed)
[facility] [place], [place] [address]  Internal MEDICINE  Office Visit Note  Patient Name: Olivia Lane  [id number]  [id number]  Date of Service: 11/14/2021  Chief Complaint  Patient presents with   Follow-up   Hyperlipidemia   Hypertension    HPI Olivia Lane presents for follow-up visit for hypertension, hyperlipidemia and medication refills.  Patient reports that she feels like she is off balance and her balance and gait are not as good as they used to be and wants to know what vitamin she can take for this.  She had her lab work done recently and these labs were discussed with the patient in the office today.  Her vitamin D level was significantly low at 14.  Her CBC was grossly normal.  Her metabolic panel showed an elevated creatinine level and a decreased GFR which is consistent with previous labs.  Her alkaline phosphatase was slightly elevated with a pressing concern at this point.  Her thyroid levels were normal and her cholesterol levels were within normal limits except for a slightly elevated LDL level.  She is taking a statin medication every 2 to 3 days.  Her blood pressure and vital signs are stable and within normal limits.    Current Medication: Outpatient Encounter Medications as of 11/14/2021  Medication Sig   budesonide-formoterol (SYMBICORT) 160-4.5 MCG/ACT inhaler Inhale 2 puffs into the lungs 2 (two) times daily. Inhale 2 puffs into lungs 2 (two) times daily PLEASE USE NAME BRAND: SYMBICORT   nitroGLYCERIN (NITROSTAT) 0.4 MG SL tablet Place under the tongue.   promethazine (PHENERGAN) 12.5 MG tablet Take 1 tablet (12.5 mg total) by mouth every 6 (six) hours as needed for nausea or vomiting.   Vitamin D, Ergocalciferol, (DRISDOL) 1.25 MG (50000 UNIT) CAPS capsule Take 1 capsule (50,000 Units total) by mouth every 7 (seven) days.   [DISCONTINUED] albuterol (VENTOLIN HFA) 108 (90 Base) MCG/ACT inhaler Inhale 2 puffs into the lungs every 6  (six) hours as needed for wheezing or shortness of breath.   [DISCONTINUED] amLODipine (NORVASC) 10 MG tablet TAKE 1 TABLET BY MOUTH EVERY NIGHT AT BEDTIME FOR HYPERTENSION.   [DISCONTINUED] atorvastatin (LIPITOR) 40 MG tablet TAKE 1 TABLET BY MOUTH AT BEDTIME   [DISCONTINUED] benzonatate (TESSALON) 100 MG capsule Take 1 capsule (100 mg total) by mouth 2 (two) times daily as needed for cough.   [DISCONTINUED] clopidogrel (PLAVIX) 75 MG tablet Take 1 tablet (75 mg total) by mouth daily.   [DISCONTINUED] metoprolol tartrate (LOPRESSOR) 50 MG tablet Take 1 tablet (50 mg total) by mouth every 12 (twelve) hours.   [DISCONTINUED] ondansetron (ZOFRAN) 4 MG tablet Take 1 tablet (4 mg total) by mouth every 8 (eight) hours as needed for nausea or vomiting.   [DISCONTINUED] pantoprazole (PROTONIX) 40 MG tablet Take 1 tablet (40 mg total) by mouth 2 (two) times daily.   albuterol (VENTOLIN HFA) 108 (90 Base) MCG/ACT inhaler Inhale 2 puffs into the lungs every 6 (six) hours as needed for wheezing or shortness of breath.   amLODipine (NORVASC) 10 MG tablet TAKE 1 TABLET BY MOUTH EVERY NIGHT AT BEDTIME FOR HYPERTENSION.   atorvastatin (LIPITOR) 40 MG tablet Take 1 tablet (40 mg total) by mouth at bedtime.   clopidogrel (PLAVIX) 75 MG tablet Take 1 tablet (75 mg total) by mouth daily.   metoprolol tartrate (LOPRESSOR) 50 MG tablet Take 1 tablet (50 mg total) by mouth every 12 (twelve) hours.   pantoprazole (PROTONIX) 40 MG tablet Take 1  tablet (40 mg total) by mouth 2 (two) times daily.   [DISCONTINUED] atorvastatin (LIPITOR) 40 MG tablet TAKE 1 TABLET BY MOUTH AT BEDTIME   No facility-administered encounter medications on file as of 11/14/2021.    Surgical History: Past Surgical History:  Procedure Laterality Date   APPENDECTOMY  2004   BYPASS GRAFT  2014   CATARACT EXTRACTION  [id number]   COLONOSCOPY  2004   HIP ARTHROPLASTY Left 01/04/2022   Procedure: ARTHROPLASTY BIPOLAR HIP (HEMIARTHROPLASTY);   Surgeon: Olivia Knows, MD;  Location: [facility] ORS;  Service: Orthopedics;  Laterality: Left;    Medical History: Past Medical History:  Diagnosis Date   Anemia    Gastro-esophageal reflux    Hyperlipidemia    Hypertension     Family History: Family History  Problem Relation Age of Onset   Hypertension Father    Coronary artery disease Sister    Hypertension Sister    Diabetes Brother    Hypertension Brother     Social History   Socioeconomic History   Marital status: Single    Spouse name: Not on file   Number of children: Not on file   Years of education: Not on file   Highest education level: Not on file  Occupational History   Not on file  Tobacco Use   Smoking status: Former    Packs/day: 1.00    Years: 40.00    Total pack years: 40.00    Types: Cigarettes    Quit date: 03/24/2013    Years since quitting: 8.8   Smokeless tobacco: Never  Vaping Use   Vaping Use: Never used  Substance and Sexual Activity   Alcohol use: No   Drug use: No   Sexual activity: Not on file  Other Topics Concern   Not on file  Social History Narrative   Not on file   Social Determinants of Health   Financial Resource Strain: Low Risk  (11/09/2020)   Overall Financial Resource Strain (CARDIA)    Difficulty of Paying Living Expenses: Not hard at all  Food Insecurity: Not on file  Transportation Needs: Not on file  Physical Activity: Not on file  Stress: Not on file  Social Connections: Not on file  Intimate Partner Violence: Not on file      Review of Systems  Constitutional:  Positive for fatigue. Negative for chills and unexpected weight change.  HENT:  Negative for congestion, rhinorrhea, sneezing and sore throat.   Eyes:  Negative for redness.  Respiratory: Negative.  Negative for cough, chest tightness and shortness of breath.   Cardiovascular: Negative.  Negative for chest pain and palpitations.  Gastrointestinal: Negative.  Negative for abdominal pain,  constipation, diarrhea, nausea and vomiting.  Genitourinary:  Negative for dysuria and frequency.  Musculoskeletal:  Positive for gait problem. Negative for arthralgias, back pain, joint swelling and neck pain.  Skin:  Negative for rash.  Neurological:  Negative for tremors and numbness.  Hematological:  Negative for adenopathy. Does not bruise/bleed easily.  Psychiatric/Behavioral:  Negative for behavioral problems (Depression), sleep disturbance and suicidal ideas. The patient is not nervous/anxious.     Vital Signs: BP 140/65 Comment: 148/80  Pulse 65   Temp 98 F (36.7 C)   Resp 16   Ht 5' 2.5" (1.588 m)   Wt 100 lb (45.4 kg)   SpO2 95%   BMI 18.00 kg/m    Physical Exam Vitals reviewed.  Constitutional:      General: She is not in acute distress.  Appearance: Normal appearance. She is not ill-appearing.  HENT:     Head: Normocephalic and atraumatic.  Eyes:     Pupils: Pupils are equal, round, and reactive to light.  Cardiovascular:     Rate and Rhythm: Normal rate and regular rhythm.  Pulmonary:     Effort: Pulmonary effort is normal. No respiratory distress.  Neurological:     Mental Status: She is alert and oriented to person, place, and time.  Psychiatric:        Mood and Affect: Mood normal.        Behavior: Behavior normal.        Assessment/Plan: 1. Essential hypertension Stable and controlled with current medications, refills ordered. - metoprolol tartrate (LOPRESSOR) 50 MG tablet; Take 1 tablet (50 mg total) by mouth every 12 (twelve) hours.  Dispense: 180 tablet; Refill: 3 - amLODipine (NORVASC) 10 MG tablet; TAKE 1 TABLET BY MOUTH EVERY NIGHT AT BEDTIME FOR HYPERTENSION.  Dispense: 90 tablet; Refill: 3  2. Gastroesophageal reflux disease with esophagitis, unspecified whether hemorrhage Well-controlled with pantoprazole, refills ordered. - pantoprazole (PROTONIX) 40 MG tablet; Take 1 tablet (40 mg total) by mouth 2 (two) times daily.  Dispense: 180  tablet; Refill: 3  3. Chronic obstructive pulmonary disease, unspecified COPD type ([place]) Respiratory status stable, albuterol inhaler refills ordered - albuterol (VENTOLIN HFA) 108 (90 Base) MCG/ACT inhaler; Inhale 2 puffs into the lungs every 6 (six) hours as needed for wheezing or shortness of breath.  Dispense: 8 g; Refill: 4  4. Thrombocytosis Plavix refills ordered - clopidogrel (PLAVIX) 75 MG tablet; Take 1 tablet (75 mg total) by mouth daily.  Dispense: 90 tablet; Refill: 3  5. PAD (peripheral artery disease) (HCC) Atorvastatin refills ordered - atorvastatin (LIPITOR) 40 MG tablet; Take 1 tablet (40 mg total) by mouth at bedtime.  Dispense: 90 tablet; Refill: 3  6. Vitamin D deficiency Vitamin D refills ordered - Vitamin D, Ergocalciferol, (DRISDOL) 1.25 MG (50000 UNIT) CAPS capsule; Take 1 capsule (50,000 Units total) by mouth every 7 (seven) days.  Dispense: 12 capsule; Refill: 1   General Counseling: cleda imel understanding of the findings of todays visit and agrees with plan of treatment. I have discussed any further diagnostic evaluation that may be needed or ordered today. We also reviewed her medications today. she has been encouraged to call the office with any questions or concerns that should arise related to todays visit.    No orders of the defined types were placed in this encounter.   Meds ordered this encounter  Medications   Vitamin D, Ergocalciferol, (DRISDOL) 1.25 MG (50000 UNIT) CAPS capsule    Sig: Take 1 capsule (50,000 Units total) by mouth every 7 (seven) days.    Dispense:  12 capsule    Refill:  1   pantoprazole (PROTONIX) 40 MG tablet    Sig: Take 1 tablet (40 mg total) by mouth 2 (two) times daily.    Dispense:  180 tablet    Refill:  3   metoprolol tartrate (LOPRESSOR) 50 MG tablet    Sig: Take 1 tablet (50 mg total) by mouth every 12 (twelve) hours.    Dispense:  180 tablet    Refill:  3   clopidogrel (PLAVIX) 75 MG tablet    Sig:  Take 1 tablet (75 mg total) by mouth daily.    Dispense:  90 tablet    Refill:  3   albuterol (VENTOLIN HFA) 108 (90 Base) MCG/ACT inhaler    Sig: Inhale  2 puffs into the lungs every 6 (six) hours as needed for wheezing or shortness of breath.    Dispense:  8 g    Refill:  4    Patient will call pharmacy when she needs a refill.   amLODipine (NORVASC) 10 MG tablet    Sig: TAKE 1 TABLET BY MOUTH EVERY NIGHT AT BEDTIME FOR HYPERTENSION.    Dispense:  90 tablet    Refill:  3   atorvastatin (LIPITOR) 40 MG tablet    Sig: Take 1 tablet (40 mg total) by mouth at bedtime.    Dispense:  90 tablet    Refill:  3    Return for previously scheduled, CPE, Jashae Wiggs PCP.   Total time spent:30 Minutes Time spent includes review of chart, medications, test results, and follow up plan with the patient.   [place] Controlled Substance Database was reviewed by me.  This patient was seen by Jonetta Osgood, FNP-C in collaboration with Dr. Clayborn Bigness as a part of collaborative care agreement.   Delissa Silba R. Valetta Fuller, MSN, FNP-C Internal medicine

## 2022-01-02 ENCOUNTER — Inpatient Hospital Stay
Admission: EM | Admit: 2022-01-02 | Discharge: 2022-01-07 | DRG: 521 | Disposition: A | Discharge status: Skilled Nursing Facility | Payer: Medicare Other | Attending: Hospitalist | Admitting: Hospitalist

## 2022-01-02 ENCOUNTER — Emergency Department: Payer: Medicare Other

## 2022-01-02 ENCOUNTER — Other Ambulatory Visit: Payer: Self-pay

## 2022-01-02 DIAGNOSIS — E785 Hyperlipidemia, unspecified: Secondary | ICD-10-CM | POA: Diagnosis present

## 2022-01-02 DIAGNOSIS — Z79899 Other long term (current) drug therapy: Secondary | ICD-10-CM

## 2022-01-02 DIAGNOSIS — I2510 Atherosclerotic heart disease of native coronary artery without angina pectoris: Secondary | ICD-10-CM | POA: Diagnosis not present

## 2022-01-02 DIAGNOSIS — I739 Peripheral vascular disease, unspecified: Secondary | ICD-10-CM | POA: Diagnosis not present

## 2022-01-02 DIAGNOSIS — E875 Hyperkalemia: Secondary | ICD-10-CM | POA: Diagnosis present

## 2022-01-02 DIAGNOSIS — Z951 Presence of aortocoronary bypass graft: Secondary | ICD-10-CM

## 2022-01-02 DIAGNOSIS — Z7902 Long term (current) use of antithrombotics/antiplatelets: Secondary | ICD-10-CM | POA: Diagnosis not present

## 2022-01-02 DIAGNOSIS — K219 Gastro-esophageal reflux disease without esophagitis: Secondary | ICD-10-CM | POA: Diagnosis present

## 2022-01-02 DIAGNOSIS — Z043 Encounter for examination and observation following other accident: Secondary | ICD-10-CM | POA: Diagnosis not present

## 2022-01-02 DIAGNOSIS — R6889 Other general symptoms and signs: Secondary | ICD-10-CM | POA: Diagnosis not present

## 2022-01-02 DIAGNOSIS — Z8249 Family history of ischemic heart disease and other diseases of the circulatory system: Secondary | ICD-10-CM | POA: Diagnosis not present

## 2022-01-02 DIAGNOSIS — J449 Chronic obstructive pulmonary disease, unspecified: Secondary | ICD-10-CM | POA: Diagnosis not present

## 2022-01-02 DIAGNOSIS — I480 Paroxysmal atrial fibrillation: Secondary | ICD-10-CM | POA: Diagnosis not present

## 2022-01-02 DIAGNOSIS — Z7901 Long term (current) use of anticoagulants: Secondary | ICD-10-CM | POA: Diagnosis not present

## 2022-01-02 DIAGNOSIS — Z7951 Long term (current) use of inhaled steroids: Secondary | ICD-10-CM

## 2022-01-02 DIAGNOSIS — W19XXXA Unspecified fall, initial encounter: Secondary | ICD-10-CM | POA: Diagnosis not present

## 2022-01-02 DIAGNOSIS — I129 Hypertensive chronic kidney disease with stage 1 through stage 4 chronic kidney disease, or unspecified chronic kidney disease: Secondary | ICD-10-CM | POA: Diagnosis present

## 2022-01-02 DIAGNOSIS — J9601 Acute respiratory failure with hypoxia: Secondary | ICD-10-CM | POA: Diagnosis not present

## 2022-01-02 DIAGNOSIS — Z87891 Personal history of nicotine dependence: Secondary | ICD-10-CM | POA: Diagnosis not present

## 2022-01-02 DIAGNOSIS — N1832 Chronic kidney disease, stage 3b: Secondary | ICD-10-CM | POA: Diagnosis present

## 2022-01-02 DIAGNOSIS — R9431 Abnormal electrocardiogram [ECG] [EKG]: Secondary | ICD-10-CM | POA: Diagnosis not present

## 2022-01-02 DIAGNOSIS — S72002A Fracture of unspecified part of neck of left femur, initial encounter for closed fracture: Principal | ICD-10-CM | POA: Diagnosis present

## 2022-01-02 DIAGNOSIS — R410 Disorientation, unspecified: Secondary | ICD-10-CM | POA: Diagnosis not present

## 2022-01-02 DIAGNOSIS — M25552 Pain in left hip: Secondary | ICD-10-CM | POA: Diagnosis not present

## 2022-01-02 DIAGNOSIS — I10 Essential (primary) hypertension: Secondary | ICD-10-CM | POA: Diagnosis present

## 2022-01-02 DIAGNOSIS — R079 Chest pain, unspecified: Secondary | ICD-10-CM | POA: Diagnosis not present

## 2022-01-02 DIAGNOSIS — D72829 Elevated white blood cell count, unspecified: Secondary | ICD-10-CM

## 2022-01-02 DIAGNOSIS — S0990XA Unspecified injury of head, initial encounter: Secondary | ICD-10-CM | POA: Diagnosis not present

## 2022-01-02 DIAGNOSIS — R0902 Hypoxemia: Secondary | ICD-10-CM

## 2022-01-02 DIAGNOSIS — Z743 Need for continuous supervision: Secondary | ICD-10-CM | POA: Diagnosis not present

## 2022-01-02 DIAGNOSIS — R0689 Other abnormalities of breathing: Secondary | ICD-10-CM | POA: Diagnosis not present

## 2022-01-02 DIAGNOSIS — N179 Acute kidney failure, unspecified: Secondary | ICD-10-CM | POA: Diagnosis not present

## 2022-01-02 DIAGNOSIS — W010XXA Fall on same level from slipping, tripping and stumbling without subsequent striking against object, initial encounter: Secondary | ICD-10-CM | POA: Diagnosis present

## 2022-01-02 LAB — CBC WITH DIFFERENTIAL/PLATELET
Abs Immature Granulocytes: 0.1 10*3/uL — ABNORMAL HIGH (ref 0.00–0.07)
Basophils Absolute: 0.1 10*3/uL (ref 0.0–0.1)
Basophils Relative: 1 %
Eosinophils Absolute: 0.3 10*3/uL (ref 0.0–0.5)
Eosinophils Relative: 3 %
HCT: 34.9 % — ABNORMAL LOW (ref 36.0–46.0)
Hemoglobin: 10.7 g/dL — ABNORMAL LOW (ref 12.0–15.0)
Immature Granulocytes: 1 %
Lymphocytes Relative: 15 %
Lymphs Abs: 1.6 10*3/uL (ref 0.7–4.0)
MCH: 26.9 pg (ref 26.0–34.0)
MCHC: 30.7 g/dL (ref 30.0–36.0)
MCV: 87.7 fL (ref 80.0–100.0)
Monocytes Absolute: 0.7 10*3/uL (ref 0.1–1.0)
Monocytes Relative: 6 %
Neutro Abs: 8.4 10*3/uL — ABNORMAL HIGH (ref 1.7–7.7)
Neutrophils Relative %: 74 %
Platelets: 297 10*3/uL (ref 150–400)
RBC: 3.98 MIL/uL (ref 3.87–5.11)
RDW: 17.8 % — ABNORMAL HIGH (ref 11.5–15.5)
WBC: 11.1 10*3/uL — ABNORMAL HIGH (ref 4.0–10.5)
nRBC: 0 % (ref 0.0–0.2)

## 2022-01-02 MED ORDER — ALBUTEROL SULFATE (2.5 MG/3ML) 0.083% IN NEBU
2.5000 mg | INHALATION_SOLUTION | Freq: Once | RESPIRATORY_TRACT | Status: AC
  Administered 2022-01-02: 2.5 mg via RESPIRATORY_TRACT
  Filled 2022-01-02: qty 3

## 2022-01-02 MED ORDER — MORPHINE SULFATE (PF) 2 MG/ML IV SOLN
2.0000 mg | Freq: Once | INTRAVENOUS | Status: AC
  Administered 2022-01-02: 2 mg via INTRAVENOUS
  Filled 2022-01-02: qty 1

## 2022-01-02 NOTE — ED Triage Notes (Signed)
Pt fell over cat and fell on left hip. No LOC, no head strike. On Plavix

## 2022-01-02 NOTE — ED Provider Notes (Signed)
Patient signed out to me at 57 PM.  86 year old female on Plavix who presents after mechanical fall.  Unable to range the left hip.  Pending CT head C-spine and imaging of the hip and pelvis.  X-ray of the left hip and pelvis interpreted by myself shows a left hip fracture, femoral neck.  CT head and C-spine are negative.  X-ray of the chest was obtained as patient was hypoxic after receiving fentanyl with EMS.   I have spoken with Dr. Rudene Christians.  He says that she is able to eat as will not likely have the hip replaced until Thursday given she is on Plavix.  Will consult the hospitalist for admission.   Rada Hay, MD 01/02/22 [phone]

## 2022-01-02 NOTE — ED Provider Notes (Signed)
[facility] Provider Note    Event Date/Time   First MD Initiated Contact with Patient 01/02/22 2222     (approximate)   History   Fall   HPI  Olivia Lane is a 86 y.o. female  here with fall. Pt was in her usual state of health until fall this evening. Reports that she was in her house and tripped over her cat, causing her to fall onto her left hip and side. Reports no LOC or head injury. Since then, she has had severe, 10/10, left hip pain, worse with any movement. No weakness, numbness. No chest pain. No arm pain. No head or neck pain. She does take plavix. No h/o similar injuries. No recent illnesses, and pt states she was in her usual state of health prior to the fall.       Physical Exam   Triage Vital Signs: ED Triage Vitals  Enc Vitals Group     BP 01/02/22 2214 119/72     Pulse Rate 01/02/22 2214 68     Resp 01/02/22 2214 13     Temp 01/02/22 2214 97.6 F (36.4 C)     Temp Source 01/02/22 2214 Oral     SpO2 01/02/22 2212 (!) 84 %     Weight 01/02/22 2215 104 lb 3.2 oz (47.3 kg)     Height 01/02/22 2215 '5\' 2"'$  (1.575 m)     Head Circumference --      Peak Flow --      Pain Score 01/02/22 2215 0     Pain Loc --      Pain Edu? --      Excl. in [place]? --     Most recent vital signs: Vitals:   01/02/22 2212 01/02/22 2214  BP:  119/72  Pulse:  68  Resp:  13  Temp:  97.6 F (36.4 C)  SpO2: (!) 84% 98%     General: Awake, no distress.  CV:  Good peripheral perfusion. RRR. No murmurs. Resp:  Normal effort. Lungs CTAB. Abd:  No distention. No tenderness. Other:  LLE held flexed, with slight shortening, and exquisite pain with any pROM. Marked tenderness over the lateral hip. No pelvic instability noted.   ED Results / Procedures / Treatments   Labs (all labs ordered are listed, but only abnormal results are displayed) Labs Reviewed  CBC WITH DIFFERENTIAL/PLATELET  COMPREHENSIVE METABOLIC PANEL  URINALYSIS, ROUTINE W  REFLEX MICROSCOPIC  TROPONIN I (HIGH SENSITIVITY)     EKG Junctional rhythm, VR 64. QRS 150, QTc 463. No acute ST elevations or depressions. TWI in inferior leads.   RADIOLOGY XR: Pending   I also independently reviewed and agree with radiologist interpretations.   PROCEDURES:  Critical Care performed: No   MEDICATIONS ORDERED IN ED: Medications - No data to display   IMPRESSION / MDM / [place] / ED COURSE  I reviewed the triage vital signs and the nursing notes.                               The patient is on the cardiac monitor to evaluate for evidence of arrhythmia and/or significant heart rate changes.   Ddx:  Differential includes the following, with pertinent life- or limb-threatening emergencies considered:  Mechanical fall with hip fx vs dislocation, pelvic fx, likely mechanical fall but ddx anemia, UTI/PNA  Patient's presentation is most consistent with acute presentation with potential  threat to life or bodily function.  MDM:  86 yo F here with likely mechanical fall and L hip pain. Suspect L hip fx clinically. No other apparent injuries beyond L elbow skin tears. Tetanus updated. Pt is adamant she had no fever, chills, or other concerning sx prior to this. Plan to f/u imaging, labs, likely admit.  Of note, pt noted to be hypoxic en route. H/o COPD followed in [place] clinic but denies h/o hypoxia. Pt also had received fentanyl. Will check CXR.   MEDICATIONS GIVEN IN ED: Medications - No data to display   Consults:     EMR reviewed  COPD notes from [facility]     FINAL CLINICAL IMPRESSION(S) / ED DIAGNOSES   Final diagnoses:  Fall, initial encounter  Hypoxia     Rx / DC Orders   ED Discharge Orders     None        Note:  This document was prepared using Dragon voice recognition software and may include unintentional dictation errors.   Duffy Bruce, MD 01/02/22 2326

## 2022-01-03 ENCOUNTER — Encounter: Payer: Self-pay | Admitting: Family Medicine

## 2022-01-03 ENCOUNTER — Inpatient Hospital Stay: Payer: Medicare Other

## 2022-01-03 DIAGNOSIS — Z87891 Personal history of nicotine dependence: Secondary | ICD-10-CM | POA: Diagnosis not present

## 2022-01-03 DIAGNOSIS — Z7901 Long term (current) use of anticoagulants: Secondary | ICD-10-CM | POA: Diagnosis not present

## 2022-01-03 DIAGNOSIS — I2510 Atherosclerotic heart disease of native coronary artery without angina pectoris: Secondary | ICD-10-CM

## 2022-01-03 DIAGNOSIS — E785 Hyperlipidemia, unspecified: Secondary | ICD-10-CM

## 2022-01-03 DIAGNOSIS — J9601 Acute respiratory failure with hypoxia: Secondary | ICD-10-CM | POA: Diagnosis not present

## 2022-01-03 DIAGNOSIS — I10 Essential (primary) hypertension: Secondary | ICD-10-CM | POA: Diagnosis not present

## 2022-01-03 DIAGNOSIS — R531 Weakness: Secondary | ICD-10-CM | POA: Diagnosis not present

## 2022-01-03 DIAGNOSIS — Z471 Aftercare following joint replacement surgery: Secondary | ICD-10-CM | POA: Diagnosis not present

## 2022-01-03 DIAGNOSIS — Z79899 Other long term (current) drug therapy: Secondary | ICD-10-CM | POA: Diagnosis not present

## 2022-01-03 DIAGNOSIS — M7989 Other specified soft tissue disorders: Secondary | ICD-10-CM | POA: Diagnosis not present

## 2022-01-03 DIAGNOSIS — N179 Acute kidney failure, unspecified: Secondary | ICD-10-CM

## 2022-01-03 DIAGNOSIS — Z741 Need for assistance with personal care: Secondary | ICD-10-CM | POA: Diagnosis not present

## 2022-01-03 DIAGNOSIS — S22070A Wedge compression fracture of T9-T10 vertebra, initial encounter for closed fracture: Secondary | ICD-10-CM | POA: Diagnosis not present

## 2022-01-03 DIAGNOSIS — Z7902 Long term (current) use of antithrombotics/antiplatelets: Secondary | ICD-10-CM | POA: Diagnosis not present

## 2022-01-03 DIAGNOSIS — I129 Hypertensive chronic kidney disease with stage 1 through stage 4 chronic kidney disease, or unspecified chronic kidney disease: Secondary | ICD-10-CM | POA: Diagnosis not present

## 2022-01-03 DIAGNOSIS — R278 Other lack of coordination: Secondary | ICD-10-CM | POA: Diagnosis not present

## 2022-01-03 DIAGNOSIS — I739 Peripheral vascular disease, unspecified: Secondary | ICD-10-CM | POA: Diagnosis not present

## 2022-01-03 DIAGNOSIS — Z7951 Long term (current) use of inhaled steroids: Secondary | ICD-10-CM | POA: Diagnosis not present

## 2022-01-03 DIAGNOSIS — W19XXXA Unspecified fall, initial encounter: Secondary | ICD-10-CM

## 2022-01-03 DIAGNOSIS — S72042A Displaced fracture of base of neck of left femur, initial encounter for closed fracture: Secondary | ICD-10-CM | POA: Diagnosis not present

## 2022-01-03 DIAGNOSIS — I480 Paroxysmal atrial fibrillation: Secondary | ICD-10-CM | POA: Diagnosis present

## 2022-01-03 DIAGNOSIS — N1832 Chronic kidney disease, stage 3b: Secondary | ICD-10-CM

## 2022-01-03 DIAGNOSIS — Z743 Need for continuous supervision: Secondary | ICD-10-CM | POA: Diagnosis not present

## 2022-01-03 DIAGNOSIS — S72002D Fracture of unspecified part of neck of left femur, subsequent encounter for closed fracture with routine healing: Secondary | ICD-10-CM | POA: Diagnosis not present

## 2022-01-03 DIAGNOSIS — J90 Pleural effusion, not elsewhere classified: Secondary | ICD-10-CM | POA: Diagnosis not present

## 2022-01-03 DIAGNOSIS — Z8249 Family history of ischemic heart disease and other diseases of the circulatory system: Secondary | ICD-10-CM | POA: Diagnosis not present

## 2022-01-03 DIAGNOSIS — R0902 Hypoxemia: Secondary | ICD-10-CM | POA: Diagnosis not present

## 2022-01-03 DIAGNOSIS — M6281 Muscle weakness (generalized): Secondary | ICD-10-CM | POA: Diagnosis not present

## 2022-01-03 DIAGNOSIS — W010XXA Fall on same level from slipping, tripping and stumbling without subsequent striking against object, initial encounter: Secondary | ICD-10-CM | POA: Diagnosis present

## 2022-01-03 DIAGNOSIS — R52 Pain, unspecified: Secondary | ICD-10-CM | POA: Diagnosis not present

## 2022-01-03 DIAGNOSIS — R5381 Other malaise: Secondary | ICD-10-CM | POA: Diagnosis not present

## 2022-01-03 DIAGNOSIS — J449 Chronic obstructive pulmonary disease, unspecified: Secondary | ICD-10-CM | POA: Diagnosis not present

## 2022-01-03 DIAGNOSIS — R410 Disorientation, unspecified: Secondary | ICD-10-CM | POA: Diagnosis present

## 2022-01-03 DIAGNOSIS — J439 Emphysema, unspecified: Secondary | ICD-10-CM | POA: Diagnosis not present

## 2022-01-03 DIAGNOSIS — S72002A Fracture of unspecified part of neck of left femur, initial encounter for closed fracture: Principal | ICD-10-CM

## 2022-01-03 DIAGNOSIS — Z9181 History of falling: Secondary | ICD-10-CM | POA: Diagnosis not present

## 2022-01-03 DIAGNOSIS — R2689 Other abnormalities of gait and mobility: Secondary | ICD-10-CM | POA: Diagnosis not present

## 2022-01-03 DIAGNOSIS — K219 Gastro-esophageal reflux disease without esophagitis: Secondary | ICD-10-CM | POA: Diagnosis present

## 2022-01-03 DIAGNOSIS — D72829 Elevated white blood cell count, unspecified: Secondary | ICD-10-CM | POA: Diagnosis not present

## 2022-01-03 DIAGNOSIS — E875 Hyperkalemia: Secondary | ICD-10-CM | POA: Diagnosis not present

## 2022-01-03 DIAGNOSIS — Z96642 Presence of left artificial hip joint: Secondary | ICD-10-CM | POA: Diagnosis not present

## 2022-01-03 DIAGNOSIS — J9811 Atelectasis: Secondary | ICD-10-CM | POA: Diagnosis not present

## 2022-01-03 DIAGNOSIS — Z951 Presence of aortocoronary bypass graft: Secondary | ICD-10-CM | POA: Diagnosis not present

## 2022-01-03 LAB — CBC
HCT: 37.7 % (ref 36.0–46.0)
Hemoglobin: 11.2 g/dL — ABNORMAL LOW (ref 12.0–15.0)
MCH: 26.5 pg (ref 26.0–34.0)
MCHC: 29.7 g/dL — ABNORMAL LOW (ref 30.0–36.0)
MCV: 89.3 fL (ref 80.0–100.0)
Platelets: 394 10*3/uL (ref 150–400)
RBC: 4.22 MIL/uL (ref 3.87–5.11)
RDW: 17.2 % — ABNORMAL HIGH (ref 11.5–15.5)
WBC: 16.5 10*3/uL — ABNORMAL HIGH (ref 4.0–10.5)
nRBC: 0 % (ref 0.0–0.2)

## 2022-01-03 LAB — BASIC METABOLIC PANEL
Anion gap: 6 (ref 5–15)
BUN: 18 mg/dL (ref 8–23)
CO2: 24 mmol/L (ref 22–32)
Calcium: 8.7 mg/dL — ABNORMAL LOW (ref 8.9–10.3)
Chloride: 111 mmol/L (ref 98–111)
Creatinine, Ser: 1.71 mg/dL — ABNORMAL HIGH (ref 0.44–1.00)
GFR, Estimated: 29 mL/min — ABNORMAL LOW (ref >60)
Glucose, Bld: 131 mg/dL — ABNORMAL HIGH (ref 70–99)
Potassium: 4.8 mmol/L (ref 3.5–5.1)
Sodium: 141 mmol/L (ref 135–145)

## 2022-01-03 LAB — COMPREHENSIVE METABOLIC PANEL
ALT: 12 U/L (ref 0–44)
AST: 19 U/L (ref 15–41)
Albumin: 3.2 g/dL — ABNORMAL LOW (ref 3.5–5.0)
Alkaline Phosphatase: 96 U/L (ref 38–126)
Anion gap: 5 (ref 5–15)
BUN: 17 mg/dL (ref 8–23)
CO2: 24 mmol/L (ref 22–32)
Calcium: 8.4 mg/dL — ABNORMAL LOW (ref 8.9–10.3)
Chloride: 110 mmol/L (ref 98–111)
Creatinine, Ser: 1.69 mg/dL — ABNORMAL HIGH (ref 0.44–1.00)
GFR, Estimated: 30 mL/min — ABNORMAL LOW (ref >60)
Glucose, Bld: 125 mg/dL — ABNORMAL HIGH (ref 70–99)
Potassium: 4.5 mmol/L (ref 3.5–5.1)
Sodium: 139 mmol/L (ref 135–145)
Total Bilirubin: 0.5 mg/dL (ref 0.3–1.2)
Total Protein: 6.8 g/dL (ref 6.5–8.1)

## 2022-01-03 LAB — TYPE AND SCREEN
ABO/RH(D): O POS
Antibody Screen: NEGATIVE

## 2022-01-03 LAB — SURGICAL PCR SCREEN
MRSA, PCR: NEGATIVE
Staphylococcus aureus: POSITIVE — AB

## 2022-01-03 LAB — TROPONIN I (HIGH SENSITIVITY)
Troponin I (High Sensitivity): 15 ng/L (ref <18)
Troponin I (High Sensitivity): 15 ng/L (ref <18)

## 2022-01-03 LAB — ABO/RH: ABO/RH(D): O POS

## 2022-01-03 MED ORDER — ATORVASTATIN CALCIUM 20 MG PO TABS
40.0000 mg | ORAL_TABLET | Freq: Every day | ORAL | Status: DC
  Administered 2022-01-03 – 2022-01-06 (×3): 40 mg via ORAL
  Filled 2022-01-03 (×5): qty 2

## 2022-01-03 MED ORDER — METOPROLOL TARTRATE 50 MG PO TABS
50.0000 mg | ORAL_TABLET | Freq: Two times a day (BID) | ORAL | Status: DC
  Administered 2022-01-03 – 2022-01-07 (×6): 50 mg via ORAL
  Filled 2022-01-03 (×9): qty 1

## 2022-01-03 MED ORDER — CHLORHEXIDINE GLUCONATE CLOTH 2 % EX PADS: 6.0000 | MEDICATED_PAD | Freq: Every day | CUTANEOUS | Status: DC

## 2022-01-03 MED ORDER — AMLODIPINE BESYLATE 10 MG PO TABS
10.0000 mg | ORAL_TABLET | Freq: Every day | ORAL | Status: DC
  Administered 2022-01-03 – 2022-01-07 (×2): 10 mg via ORAL
  Filled 2022-01-03 (×3): qty 1
  Filled 2022-01-03: qty 2
  Filled 2022-01-03: qty 1

## 2022-01-03 MED ORDER — PANTOPRAZOLE SODIUM 40 MG PO TBEC
40.0000 mg | DELAYED_RELEASE_TABLET | Freq: Two times a day (BID) | ORAL | Status: DC
  Administered 2022-01-03 – 2022-01-07 (×6): 40 mg via ORAL
  Filled 2022-01-03 (×9): qty 1

## 2022-01-03 MED ORDER — ONDANSETRON HCL 4 MG/2ML IJ SOLN
4.0000 mg | INTRAMUSCULAR | Status: DC | PRN
  Administered 2022-01-04: 4 mg via INTRAVENOUS

## 2022-01-03 MED ORDER — NITROGLYCERIN 0.4 MG SL SUBL
0.4000 mg | SUBLINGUAL_TABLET | SUBLINGUAL | Status: DC | PRN
Start: 2022-01-03 — End: 2022-01-07

## 2022-01-03 MED ORDER — MAGNESIUM HYDROXIDE 400 MG/5ML PO SUSP: 30.0000 mL | Freq: Every day | ORAL | Status: DC | PRN

## 2022-01-03 MED ORDER — MORPHINE SULFATE (PF) 2 MG/ML IV SOLN
0.5000 mg | INTRAVENOUS | Status: DC | PRN
  Administered 2022-01-03: 0.5 mg via INTRAVENOUS
  Filled 2022-01-03: qty 1

## 2022-01-03 MED ORDER — MOMETASONE FURO-FORMOTEROL FUM 200-5 MCG/ACT IN AERO: 2.0000 | INHALATION_SPRAY | Freq: Two times a day (BID) | RESPIRATORY_TRACT | Status: DC

## 2022-01-03 MED ORDER — ACETAMINOPHEN 325 MG PO TABS: 650.0000 mg | ORAL_TABLET | Freq: Four times a day (QID) | ORAL | Status: DC | PRN

## 2022-01-03 MED ORDER — ENSURE ENLIVE PO LIQD
237.0000 mL | Freq: Two times a day (BID) | ORAL | Status: DC
  Administered 2022-01-03 – 2022-01-07 (×5): 237 mL via ORAL

## 2022-01-03 MED ORDER — TRAZODONE HCL 50 MG PO TABS
25.0000 mg | ORAL_TABLET | Freq: Every evening | ORAL | Status: DC | PRN
  Administered 2022-01-03 – 2022-01-04 (×2): 25 mg via ORAL
  Filled 2022-01-03 (×3): qty 1

## 2022-01-03 MED ORDER — ACETAMINOPHEN 500 MG PO TABS
1000.0000 mg | ORAL_TABLET | Freq: Three times a day (TID) | ORAL | Status: AC
  Administered 2022-01-03 – 2022-01-05 (×5): 1000 mg via ORAL
  Filled 2022-01-03 (×6): qty 2

## 2022-01-03 MED ORDER — ALBUTEROL SULFATE (2.5 MG/3ML) 0.083% IN NEBU: 2.5000 mg | INHALATION_SOLUTION | Freq: Four times a day (QID) | RESPIRATORY_TRACT | Status: DC | PRN

## 2022-01-03 MED ORDER — CLOPIDOGREL BISULFATE 75 MG PO TABS
75.0000 mg | ORAL_TABLET | Freq: Every day | ORAL | Status: DC
  Administered 2022-01-03 – 2022-01-07 (×2): 75 mg via ORAL
  Filled 2022-01-03 (×4): qty 1

## 2022-01-03 MED ORDER — ADULT MULTIVITAMIN W/MINERALS CH
1.0000 | ORAL_TABLET | Freq: Every day | ORAL | Status: DC
  Administered 2022-01-03 – 2022-01-07 (×3): 1 via ORAL
  Filled 2022-01-03 (×4): qty 1

## 2022-01-03 MED ORDER — VITAMIN D (ERGOCALCIFEROL) 1.25 MG (50000 UNIT) PO CAPS
50000.0000 [IU] | ORAL_CAPSULE | ORAL | Status: DC
  Administered 2022-01-07: 50000 [IU] via ORAL
  Filled 2022-01-03: qty 1

## 2022-01-03 MED ORDER — LACTATED RINGERS IV SOLN: INTRAVENOUS | Status: DC

## 2022-01-03 MED ORDER — CEFAZOLIN SODIUM-DEXTROSE 1-4 GM/50ML-% IV SOLN
1.0000 g | Freq: Once | INTRAVENOUS | Status: AC
  Administered 2022-01-04: 1 g via INTRAVENOUS
  Filled 2022-01-03: qty 50

## 2022-01-03 MED ORDER — OXYCODONE HCL 5 MG PO TABS: 2.5000 mg | ORAL_TABLET | ORAL | Status: DC | PRN

## 2022-01-03 MED ORDER — OXYCODONE HCL 5 MG PO TABS
5.0000 mg | ORAL_TABLET | ORAL | Status: DC | PRN
  Administered 2022-01-03 (×2): 5 mg via ORAL
  Filled 2022-01-03 (×2): qty 1

## 2022-01-03 MED ORDER — HYDROMORPHONE HCL 1 MG/ML IJ SOLN
1.0000 mg | INTRAMUSCULAR | Status: DC | PRN
  Administered 2022-01-03 (×3): 1 mg via INTRAVENOUS
  Filled 2022-01-03 (×3): qty 1

## 2022-01-03 MED ORDER — HYDROCODONE-ACETAMINOPHEN 5-325 MG PO TABS: 1.0000 | ORAL_TABLET | Freq: Four times a day (QID) | ORAL | Status: DC | PRN

## 2022-01-03 MED ORDER — ACETAMINOPHEN 325 MG PO TABS: 650.0000 mg | ORAL_TABLET | ORAL | Status: DC | PRN

## 2022-01-03 MED ORDER — MUPIROCIN 2 % EX OINT
1.0000 | TOPICAL_OINTMENT | Freq: Two times a day (BID) | CUTANEOUS | Status: DC
  Filled 2022-01-03: qty 22

## 2022-01-03 MED ORDER — POLYETHYLENE GLYCOL 3350 17 G PO PACK
17.0000 g | PACK | Freq: Two times a day (BID) | ORAL | Status: DC
  Administered 2022-01-03 – 2022-01-07 (×6): 17 g via ORAL
  Filled 2022-01-03 (×6): qty 1

## 2022-01-03 MED ORDER — PROMETHAZINE HCL 25 MG PO TABS: 12.5000 mg | ORAL_TABLET | Freq: Four times a day (QID) | ORAL | Status: DC | PRN

## 2022-01-03 NOTE — Assessment & Plan Note (Addendum)
-   We will continue statin therapy. 

## 2022-01-03 NOTE — Assessment & Plan Note (Signed)
Needs follow up with vascular surgery with kinked vascular stent within superficial femoral artery

## 2022-01-03 NOTE — Progress Notes (Signed)
Initial Nutrition Assessment  DOCUMENTATION CODES:   Not applicable  INTERVENTION:   -Ensure Enlive po BID, each supplement provides 350 kcal and 20 grams of protein -MVI with minerals daily  NUTRITION DIAGNOSIS:   Increased nutrient needs related to post-op healing as evidenced by estimated needs.  GOAL:   Patient will meet greater than or equal to 90% of their needs  MONITOR:   PO intake, Supplement acceptance, Diet advancement  REASON FOR ASSESSMENT:   Consult Assessment of nutrition requirement/status, Hip fracture protocol  ASSESSMENT:   Pt with medical history significant for GERD, hypertension, COPD CAD, PAD, paroxysmal atrial fibrillation, on Eliquis and dyslipidemia, who presented with a [place] of left hip pain after having an accidental mechanical fall tripping over her cat  Pt admitted with closed lt hip fracture s/p mechanical fall.   Per orthopedics notes, plan for lt hip hemiarthroplasty. Plan for tomorrow secondary to plavix.    Pt unavailable at time of visit. RD unable to obtain further nutrition-related history or complete nutrition-focused physical exam at this time.    Pt currently on a regular diet. No meal completion data available at this time.   Reviewed wt hx; no wt loss noted over the past 2 months.   Pt with increased nutritional needs for post-op healing and would benefit from addition of oral nutrition supplements.   Medications reviewed and include miralax and vitamin D.   Labs reviewed.    Diet Order:   Diet Order             Diet regular Room service appropriate? Yes; Fluid consistency: Thin  Diet effective now                   EDUCATION NEEDS:   No education needs have been identified at this time  Skin:  Skin Assessment: Reviewed RN Assessment  Last BM:  Unknown  Height:   Ht Readings from Last 1 Encounters:  01/02/22 '5\' 2"'$  (1.575 m)    Weight:   Wt Readings from Last 1 Encounters:  01/02/22 47.3 kg     Ideal Body Weight:  50 kg  BMI:  Body mass index is 19.06 kg/m.  Estimated Nutritional Needs:   Kcal:  1650-1850  Protein:  80-95 grams  Fluid:  > 1.6 L    Loistine Chance, RD, LDN, [place] Registered Dietitian II Certified Diabetes Care and Education Specialist Please refer to [facility] for RD and/or RD on-call/weekend/after hours pager

## 2022-01-03 NOTE — Assessment & Plan Note (Addendum)
Will follow CT chest without contrast (CT c spine with mild LUL atelectasis and/or infiltrate) She's desatting to 80's on RA Possibly due to COPD and pain meds, will follow and w/u additionally as needed

## 2022-01-03 NOTE — Assessment & Plan Note (Addendum)
No signs exacerbation, continue home meds

## 2022-01-03 NOTE — Plan of Care (Signed)
  Problem: Education: Goal: Knowledge of General Education information will improve Description Including pain rating scale, medication(s)/side effects and non-pharmacologic comfort measures Outcome: Progressing   Problem: Health Behavior/Discharge Planning: Goal: Ability to manage health-related needs will improve Outcome: Progressing   

## 2022-01-03 NOTE — ED Notes (Signed)
Pt was given hydromorphone for pain which makes pt fall asleep. Pt then wakes up and starts moaning. Pt says she is unable to take her medications at this time

## 2022-01-03 NOTE — Consult Note (Signed)
Reason for Consult: Displaced femoral neck fracture Referring Physician: Dr. Milana Huntsman Olivia Lane is an 86 y.o. female.  HPI: Patient is 86 year old who lives in a home in [place] with her son who fell tripped over her cat.  She normally does not use assistive device and is a Hydrographic surveyor with limitations.  She does have significant peripheral vascular disease and has had undergone stents and is on Plavix.  She denies prodromal hip symptoms or prior hip pain.  Past Medical History:  Diagnosis Date   Anemia    Gastro-esophageal reflux    Hyperlipidemia    Hypertension     Past Surgical History:  Procedure Laterality Date   APPENDECTOMY  2004   BYPASS GRAFT  2014   CATARACT EXTRACTION  2006,2004   COLONOSCOPY  2004    Family History  Problem Relation Age of Onset   Hypertension Father    Coronary artery disease Sister    Hypertension Sister    Diabetes Brother    Hypertension Brother     Social History:  reports that she quit smoking about 8 years ago. Her smoking use included cigarettes. She has a 40.00 pack-year smoking history. She has never used smokeless tobacco. She reports that she does not drink alcohol and does not use drugs.  Allergies: No Known Allergies  Medications: I have reviewed the patient's current medications.  Results for orders placed or performed during the hospital encounter of 01/02/22 (from the past 48 hour(s))  CBC with Differential     Status: Abnormal   Collection Time: 01/02/22 11:25 PM  Result Value Ref Range   WBC 11.1 (H) 4.0 - 10.5 K/uL   RBC 3.98 3.87 - 5.11 MIL/uL   Hemoglobin 10.7 (L) 12.0 - 15.0 g/dL   HCT 34.9 (L) 36.0 - 46.0 %   MCV 87.7 80.0 - 100.0 fL   MCH 26.9 26.0 - 34.0 pg   MCHC 30.7 30.0 - 36.0 g/dL   RDW 17.8 (H) 11.5 - 15.5 %   Platelets 297 150 - 400 K/uL   nRBC 0.0 0.0 - 0.2 %   Neutrophils Relative % 74 %   Neutro Abs 8.4 (H) 1.7 - 7.7 K/uL   Lymphocytes Relative 15 %   Lymphs Abs 1.6 0.7 - 4.0  K/uL   Monocytes Relative 6 %   Monocytes Absolute 0.7 0.1 - 1.0 K/uL   Eosinophils Relative 3 %   Eosinophils Absolute 0.3 0.0 - 0.5 K/uL   Basophils Relative 1 %   Basophils Absolute 0.1 0.0 - 0.1 K/uL   Immature Granulocytes 1 %   Abs Immature Granulocytes 0.10 (H) 0.00 - 0.07 K/uL    Comment: Performed at [facility], [address] [place], [place] [address]  Comprehensive metabolic panel     Status: Abnormal   Collection Time: 01/03/22 12:35 AM  Result Value Ref Range   Sodium 139 135 - 145 mmol/L   Potassium 4.5 3.5 - 5.1 mmol/L   Chloride 110 98 - 111 mmol/L   CO2 24 22 - 32 mmol/L   Glucose, Bld 125 (H) 70 - 99 mg/dL    Comment: Glucose reference range applies only to samples taken after fasting for at least 8 hours.   BUN 17 8 - 23 mg/dL   Creatinine, Ser 1.69 (H) 0.44 - 1.00 mg/dL   Calcium 8.4 (L) 8.9 - 10.3 mg/dL   Total Protein 6.8 6.5 - 8.1 g/dL   Albumin 3.2 (L) 3.5 - 5.0 g/dL  AST 19 15 - 41 U/L   ALT 12 0 - 44 U/L   Alkaline Phosphatase 96 38 - 126 U/L   Total Bilirubin 0.5 0.3 - 1.2 mg/dL   GFR, Estimated 30 (L) >60 mL/min    Comment: (NOTE) Calculated using the CKD-EPI Creatinine Equation (2021)    Anion gap 5 5 - 15    Comment: Performed at [facility], [place], [place] [address]  Troponin I (High Sensitivity)     Status: None   Collection Time: 01/03/22 12:35 AM  Result Value Ref Range   Troponin I (High Sensitivity) 15 <18 ng/L    Comment: (NOTE) Elevated high sensitivity troponin I (hsTnI) values and significant  changes across serial measurements may suggest ACS but many other  chronic and acute conditions are known to elevate hsTnI results.  Refer to the "Links" section for chest pain algorithms and additional  guidance. Performed at [facility], [address] [place], [place] [address]   CBC     Status: Abnormal   Collection Time: 01/03/22  4:12 AM  Result Value Ref Range   WBC 16.5 (H)  4.0 - 10.5 K/uL   RBC 4.22 3.87 - 5.11 MIL/uL   Hemoglobin 11.2 (L) 12.0 - 15.0 g/dL   HCT 37.7 36.0 - 46.0 %   MCV 89.3 80.0 - 100.0 fL   MCH 26.5 26.0 - 34.0 pg   MCHC 29.7 (L) 30.0 - 36.0 g/dL   RDW 17.2 (H) 11.5 - 15.5 %   Platelets 394 150 - 400 K/uL   nRBC 0.0 0.0 - 0.2 %    Comment: Performed at [facility], [address] [place], [place] [address]  Basic metabolic panel     Status: Abnormal   Collection Time: 01/03/22  4:12 AM  Result Value Ref Range   Sodium 141 135 - 145 mmol/L   Potassium 4.8 3.5 - 5.1 mmol/L   Chloride 111 98 - 111 mmol/L   CO2 24 22 - 32 mmol/L   Glucose, Bld 131 (H) 70 - 99 mg/dL    Comment: Glucose reference range applies only to samples taken after fasting for at least 8 hours.   BUN 18 8 - 23 mg/dL   Creatinine, Ser 1.71 (H) 0.44 - 1.00 mg/dL   Calcium 8.7 (L) 8.9 - 10.3 mg/dL   GFR, Estimated 29 (L) >60 mL/min    Comment: (NOTE) Calculated using the CKD-EPI Creatinine Equation (2021)    Anion gap 6 5 - 15    Comment: Performed at [facility], [place], [place] [address]  Troponin I (High Sensitivity)     Status: None   Collection Time: 01/03/22  4:12 AM  Result Value Ref Range   Troponin I (High Sensitivity) 15 <18 ng/L    Comment: (NOTE) Elevated high sensitivity troponin I (hsTnI) values and significant  changes across serial measurements may suggest ACS but many other  chronic and acute conditions are known to elevate hsTnI results.  Refer to the "Links" section for chest pain algorithms and additional  guidance. Performed at [facility], [address] [place], [place] [address]     DG Hip Malvin Johns or [place] Pelvis 2-3 Views Left  Result Date: 01/02/2022 CLINICAL DATA:  Recent fall with left hip pain, initial encounter EXAM: DG HIP (WITH OR WITHOUT PELVIS) 3V LEFT COMPARISON:  None Available. FINDINGS: Pelvic ring is intact. Diffuse vascular stenting is noted. Some apparent kinking  of 1  of the stents is noted in the mid left thigh. This appears stable from 2014. Left femoral neck fracture is noted with mild impaction at the fracture site. IMPRESSION: Left femoral neck fracture with impaction at the fracture site. No other focal abnormality is seen. Kinked vascular stent within the superficial femoral artery on the left stable from 2014. Electronically Signed   By: Inez Catalina M.D.   On: 01/02/2022 23:08   CT Cervical Spine Wo Contrast  Result Date: 01/02/2022 CLINICAL DATA:  Status post fall. EXAM: CT CERVICAL SPINE WITHOUT CONTRAST TECHNIQUE: Multidetector CT imaging of the cervical spine was performed without intravenous contrast. Multiplanar CT image reconstructions were also generated. RADIATION DOSE REDUCTION: This exam was performed according to the departmental dose-optimization program which includes automated exposure control, adjustment of the mA and/or kV according to patient size and/or use of iterative reconstruction technique. COMPARISON:  None Available. FINDINGS: Alignment: Normal. Skull base and vertebrae: No acute fracture. No primary bone lesion or focal pathologic process. Soft tissues and spinal canal: No prevertebral fluid or swelling. No visible canal hematoma. Disc levels: Mild posterior endplate sclerosis is seen at the level of C4-C5. Mild anterior osteophyte formation is present at the levels of C3-C4 and C4-C5. There is marked severity narrowing of the anterior atlantoaxial articulation. Marked severity posterior intervertebral disc space narrowing is seen at the levels of C2-C3, C3-C4 and C4-C5. Moderate severity changes are seen at the levels of C5-C6 and C6-C7. Bilateral moderate severity multilevel facet joint hypertrophy is noted. Upper chest: There is marked severity emphysematous lung disease with mild atelectasis and/or infiltrate seen along the medial aspect of the left upper lobe. A small, approximately 2.0 cm x 0.6 cm area of adjacent pleural thickening is  seen. Other: None. IMPRESSION: 1. No acute fracture or subluxation of the cervical spine. 2. Marked severity multilevel degenerative changes, as described above. 3. Marked severity emphysematous lung disease. 4. Mild left upper lobe atelectasis and/or infiltrate with an adjacent area focal pleural thickening. Correlation with outpatient chest CT is recommended Emphysema (ICD10-J43.9). Electronically Signed   By: Virgina Norfolk M.D.   On: 01/02/2022 23:07   DG Chest Portable 1 View  Result Date: 01/02/2022 CLINICAL DATA:  Recent trip and fall over CT with chest pain, initial encounter EXAM: PORTABLE CHEST 1 VIEW COMPARISON:  08/13/2018 FINDINGS: Cardiac shadow is stable. Postsurgical changes are again seen. Epicardial pacing wires are noted. Lungs are well aerated without focal infiltrate. No acute bony abnormality is noted. IMPRESSION: No acute abnormality noted. Electronically Signed   By: Inez Catalina M.D.   On: 01/02/2022 23:06   CT HEAD WO CONTRAST (5MM)  Result Date: 01/02/2022 CLINICAL DATA:  Status post fall. EXAM: CT HEAD WITHOUT CONTRAST TECHNIQUE: Contiguous axial images were obtained from the base of the skull through the vertex without intravenous contrast. RADIATION DOSE REDUCTION: This exam was performed according to the departmental dose-optimization program which includes automated exposure control, adjustment of the mA and/or kV according to patient size and/or use of iterative reconstruction technique. COMPARISON:  None Available. FINDINGS: Brain: There is mild cerebral atrophy with widening of the extra-axial spaces and ventricular dilatation. There are areas of decreased attenuation within the white matter tracts of the supratentorial brain, consistent with microvascular disease changes. Small, bilateral chronic basal ganglia lacunar infarcts are seen. Vascular: No hyperdense vessel or unexpected calcification. Skull: Normal. Negative for fracture or focal lesion. Sinuses/Orbits:  Moderate severity proximal left ethmoid sinus mucosal thickening is seen. Other:  None. IMPRESSION: 1. No acute intracranial pathology. 2. Generalized cerebral atrophy with chronic white matter small vessel ischemia. Electronically Signed   By: Virgina Norfolk M.D.   On: 01/02/2022 22:57    Review of Systems Blood pressure 122/60, pulse 64, temperature 97.6 F (36.4 C), temperature source Oral, resp. rate 15, height '5\' 2"'$  (1.575 m), weight 47.3 kg, SpO2 99 %. Physical Exam The left leg is shortened and externally rotated with trace dorsalis pedis pulse she is able to flex down the toes and is in severe pain skin is intact around the anterior hip which is visible Assessment/Plan: Displaced femoral neck fracture in a patient on Plavix.  Plan is for left hip hemiarthroplasty based on her lack of arthritis and level of activity.  Hold till tomorrow secondary to the Plavix.  Risk benefits possible complications discussed with patient and hope to talk to her son later today.  Hessie Knows 01/03/2022, 6:43 AM

## 2022-01-03 NOTE — Assessment & Plan Note (Addendum)
We will continue Norvasc and Lopressor.

## 2022-01-03 NOTE — ED Notes (Signed)
Bladder scan showed 231m

## 2022-01-03 NOTE — Assessment & Plan Note (Signed)
Stress response? trend

## 2022-01-03 NOTE — TOC CM/SW Note (Signed)
CSW acknowledges SNF consult. Plan for OR tomorrow. Please consult PT and OT once appropriate so patient can be evaluated.  Dayton Scrape, [place]

## 2022-01-03 NOTE — H&P (Signed)
[place]   PATIENT NAME: Olivia Lane    MR#:  [id number]  DATE OF BIRTH:  May 17, 1936  DATE OF ADMISSION:  01/02/2022  PRIMARY CARE PHYSICIAN: Jonetta Osgood, NP   Patient is coming from: Home  REQUESTING/REFERRING PHYSICIAN: Rada Hay, MD  CHIEF COMPLAINT:   Chief Complaint  Patient presents with   Fall    HISTORY OF PRESENT ILLNESS:  Olivia Lane is a 86 y.o. Caucasian female with medical history significant for GERD, hypertension, COPD CAD, PAD, paroxysmal atrial fibrillation, on Eliquis and dyslipidemia, who presented to the emergency room with a [place] of left hip pain after having an accidental mechanical fall tripping over her cat.  The patient denied any presyncope or syncope.  No headache or dizziness or blurred vision.  No paresthesias or focal muscle weakness.  No chest pain or palpitations.  No bleeding diathesis.  She denies any nausea or vomiting or abdominal pain.  No dysuria, oliguria or hematuria or flank pain.  ED Course: Upon presentation to the emergency room, vital signs were within normal except for possibly of 84% on room air and 90% on 3 L of O2 by nasal cannula EKG as reviewed by me : EKG showed junctional rhythm with a rate of 64 with intraventricular conduction delay and right bundle branch block with Q waves anteroseptally and T wave inversion inferolaterally. Imaging: Portable chest x-ray showed no acute cardiopulmonary disease.  Left hip x-ray showed left femoral neck fracture with impaction at the fracture site with kinked vascular stent within the superficial femoral  artery on the left stable from 2014.  The patient was given 2 mg of IV morphine sulfate and 2.5 mg nebulized albuterol.  She will be admitted to a medical telemetry bed for further evaluation and management. PAST MEDICAL HISTORY:   Past Medical History:  Diagnosis Date   Anemia    Gastro-esophageal reflux    Hyperlipidemia    Hypertension      PAST SURGICAL HISTORY:   Past Surgical History:  Procedure Laterality Date   APPENDECTOMY  2004   BYPASS GRAFT  2014   CATARACT EXTRACTION  2006,2004   COLONOSCOPY  2004    SOCIAL HISTORY:   Social History   Tobacco Use   Smoking status: Former    Packs/day: 1.00    Years: 40.00    Total pack years: 40.00    Types: Cigarettes    Quit date: 03/24/2013    Years since quitting: 8.7   Smokeless tobacco: Never  Substance Use Topics   Alcohol use: No    FAMILY HISTORY:   Family History  Problem Relation Age of Onset   Hypertension Father    Coronary artery disease Sister    Hypertension Sister    Diabetes Brother    Hypertension Brother     DRUG ALLERGIES:  No Known Allergies  REVIEW OF SYSTEMS:   ROS As per history of present illness. All pertinent systems were reviewed above. Constitutional, HEENT, cardiovascular, respiratory, GI, GU, musculoskeletal, neuro, psychiatric, endocrine, integumentary and hematologic systems were reviewed and are otherwise negative/unremarkable except for positive findings mentioned above in the HPI.   MEDICATIONS AT HOME:   Prior to Admission medications   Medication Sig Start Date End Date Taking? Authorizing Provider  amLODipine (NORVASC) 10 MG tablet TAKE 1 TABLET BY MOUTH EVERY NIGHT AT BEDTIME FOR HYPERTENSION. 11/14/21  Yes Abernathy, Yetta Flock, NP  atorvastatin (LIPITOR) 40 MG tablet Take 1 tablet (40 mg total) by  mouth at bedtime. 11/14/21  Yes Abernathy, Yetta Flock, NP  budesonide-formoterol (SYMBICORT) 160-4.5 MCG/ACT inhaler Inhale 2 puffs into the lungs 2 (two) times daily. Inhale 2 puffs into lungs 2 (two) times daily PLEASE USE NAME BRAND: SYMBICORT 05/22/21  Yes Abernathy, Alyssa, NP  clopidogrel (PLAVIX) 75 MG tablet Take 1 tablet (75 mg total) by mouth daily. 11/14/21  Yes Abernathy, Yetta Flock, NP  metoprolol tartrate (LOPRESSOR) 50 MG tablet Take 1 tablet (50 mg total) by mouth every 12 (twelve) hours. 11/14/21  Yes Abernathy,  Yetta Flock, NP  pantoprazole (PROTONIX) 40 MG tablet Take 1 tablet (40 mg total) by mouth 2 (two) times daily. 11/14/21  Yes Abernathy, Yetta Flock, NP  Vitamin D, Ergocalciferol, (DRISDOL) 1.25 MG (50000 UNIT) CAPS capsule Take 1 capsule (50,000 Units total) by mouth every 7 (seven) days. 11/14/21  Yes Abernathy, Alyssa, NP  albuterol (VENTOLIN HFA) 108 (90 Base) MCG/ACT inhaler Inhale 2 puffs into the lungs every 6 (six) hours as needed for wheezing or shortness of breath. 11/14/21   Jonetta Osgood, NP  nitroGLYCERIN (NITROSTAT) 0.4 MG SL tablet Place under the tongue. 12/02/13   [provider]  promethazine (PHENERGAN) 12.5 MG tablet Take 1 tablet (12.5 mg total) by mouth every 6 (six) hours as needed for nausea or vomiting. 04/06/21   Jonetta Osgood, NP      VITAL SIGNS:  Blood pressure (!) 128/47, pulse 72, temperature 97.6 F (36.4 C), temperature source Oral, resp. rate 17, height '5\' 2"'$  (1.575 m), weight 47.3 kg, SpO2 95 %.  PHYSICAL EXAMINATION:  Physical Exam  GENERAL:  87 y.o.-year-old Caucasian female patient lying in the bed with no acute distress.  EYES: Pupils equal, round, reactive to light and accommodation. No scleral icterus. Extraocular muscles intact.  HEENT: Head atraumatic, normocephalic. Oropharynx and nasopharynx clear.  NECK:  Supple, no jugular venous distention. No thyroid enlargement, no tenderness.  LUNGS: Normal breath sounds bilaterally, no wheezing, rales,rhonchi or crepitation. No use of accessory muscles of respiration.  CARDIOVASCULAR: Regular rate and rhythm, S1, S2 normal. No murmurs, rubs, or gallops.  ABDOMEN: Soft, nondistended, nontender. Bowel sounds present. No organomegaly or mass.  EXTREMITIES: No pedal edema, cyanosis, or clubbing.  NEUROLOGIC: Cranial nerves II through XII are intact. Muscle strength 5/5 in all extremities. Sensation intact. Gait not checked. Musculoskeletal: Left hip tenderness. PSYCHIATRIC: The patient is alert and oriented  x 3.  Normal affect and good eye contact. SKIN: No obvious rash, lesion, or ulcer.   LABORATORY PANEL:   CBC Recent Labs  Lab 01/03/22 0412  WBC 16.5*  HGB 11.2*  HCT 37.7  PLT 394   ------------------------------------------------------------------------------------------------------------------  Chemistries  Recent Labs  Lab 01/03/22 0035 01/03/22 0412  NA 139 141  K 4.5 4.8  CL 110 111  CO2 24 24  GLUCOSE 125* 131*  BUN 17 18  CREATININE 1.69* 1.71*  CALCIUM 8.4* 8.7*  AST 19  --   ALT 12  --   ALKPHOS 96  --   BILITOT 0.5  --    ------------------------------------------------------------------------------------------------------------------  Cardiac Enzymes No results for input(s): "TROPONINI" in the last 168 hours. ------------------------------------------------------------------------------------------------------------------  RADIOLOGY:  DG Hip Unilat W or Wo Pelvis 2-3 Views Left  Result Date: 01/02/2022 CLINICAL DATA:  Recent fall with left hip pain, initial encounter EXAM: DG HIP (WITH OR WITHOUT PELVIS) 3V LEFT COMPARISON:  None Available. FINDINGS: Pelvic ring is intact. Diffuse vascular stenting is noted. Some apparent kinking of 1 of the stents is noted in the mid left thigh. This  appears stable from 2014. Left femoral neck fracture is noted with mild impaction at the fracture site. IMPRESSION: Left femoral neck fracture with impaction at the fracture site. No other focal abnormality is seen. Kinked vascular stent within the superficial femoral artery on the left stable from 2014. Electronically Signed   By: Inez Catalina M.D.   On: 01/02/2022 23:08   CT Cervical Spine Wo Contrast  Result Date: 01/02/2022 CLINICAL DATA:  Status post fall. EXAM: CT CERVICAL SPINE WITHOUT CONTRAST TECHNIQUE: Multidetector CT imaging of the cervical spine was performed without intravenous contrast. Multiplanar CT image reconstructions were also generated. RADIATION DOSE  REDUCTION: This exam was performed according to the departmental dose-optimization program which includes automated exposure control, adjustment of the mA and/or kV according to patient size and/or use of iterative reconstruction technique. COMPARISON:  None Available. FINDINGS: Alignment: Normal. Skull base and vertebrae: No acute fracture. No primary bone lesion or focal pathologic process. Soft tissues and spinal canal: No prevertebral fluid or swelling. No visible canal hematoma. Disc levels: Mild posterior endplate sclerosis is seen at the level of C4-C5. Mild anterior osteophyte formation is present at the levels of C3-C4 and C4-C5. There is marked severity narrowing of the anterior atlantoaxial articulation. Marked severity posterior intervertebral disc space narrowing is seen at the levels of C2-C3, C3-C4 and C4-C5. Moderate severity changes are seen at the levels of C5-C6 and C6-C7. Bilateral moderate severity multilevel facet joint hypertrophy is noted. Upper chest: There is marked severity emphysematous lung disease with mild atelectasis and/or infiltrate seen along the medial aspect of the left upper lobe. A small, approximately 2.0 cm x 0.6 cm area of adjacent pleural thickening is seen. Other: None. IMPRESSION: 1. No acute fracture or subluxation of the cervical spine. 2. Marked severity multilevel degenerative changes, as described above. 3. Marked severity emphysematous lung disease. 4. Mild left upper lobe atelectasis and/or infiltrate with an adjacent area focal pleural thickening. Correlation with outpatient chest CT is recommended Emphysema (ICD10-J43.9). Electronically Signed   By: Virgina Norfolk M.D.   On: 01/02/2022 23:07   DG Chest Portable 1 View  Result Date: 01/02/2022 CLINICAL DATA:  Recent trip and fall over CT with chest pain, initial encounter EXAM: PORTABLE CHEST 1 VIEW COMPARISON:  08/13/2018 FINDINGS: Cardiac shadow is stable. Postsurgical changes are again seen. Epicardial  pacing wires are noted. Lungs are well aerated without focal infiltrate. No acute bony abnormality is noted. IMPRESSION: No acute abnormality noted. Electronically Signed   By: Inez Catalina M.D.   On: 01/02/2022 23:06   CT HEAD WO CONTRAST (5MM)  Result Date: 01/02/2022 CLINICAL DATA:  Status post fall. EXAM: CT HEAD WITHOUT CONTRAST TECHNIQUE: Contiguous axial images were obtained from the base of the skull through the vertex without intravenous contrast. RADIATION DOSE REDUCTION: This exam was performed according to the departmental dose-optimization program which includes automated exposure control, adjustment of the mA and/or kV according to patient size and/or use of iterative reconstruction technique. COMPARISON:  None Available. FINDINGS: Brain: There is mild cerebral atrophy with widening of the extra-axial spaces and ventricular dilatation. There are areas of decreased attenuation within the white matter tracts of the supratentorial brain, consistent with microvascular disease changes. Small, bilateral chronic basal ganglia lacunar infarcts are seen. Vascular: No hyperdense vessel or unexpected calcification. Skull: Normal. Negative for fracture or focal lesion. Sinuses/Orbits: Moderate severity proximal left ethmoid sinus mucosal thickening is seen. Other: None. IMPRESSION: 1. No acute intracranial pathology. 2. Generalized cerebral atrophy with chronic white  matter small vessel ischemia. Electronically Signed   By: Virgina Norfolk M.D.   On: 01/02/2022 22:57      IMPRESSION AND PLAN:  Assessment and Plan: * Closed left hip fracture ([place]) -This is secondary to accidental mechanical fall. - The patient will be admitted to a medical telemetry bed. - Pain management will be provided. - Orthopedic consultation will be obtained. - Plavix will be held off. - Dr. Rudene Christians was notified about patient and is aware. - The patient has history of coronary artery disease with no history of diabetes  mellitus on insulin, CVA, CHF or renal failure with creatinine more than 2.  She is considered above average risk for her age for perioperative cardiovascular events per the revised cardiac risk index.  She has history of COPD with no significant current pulmonary issues.  She was initially hypoxic requiring 3 L and later 1 L of O2 and was later on room air.  Chronic obstructive pulmonary disease (COPD) (HCC) - We will continue her inhalers and place on as needed DuoNebs.  Coronary artery disease - We will continue statin therapy as well as as needed sublingual nitroglycerin and Lopressor. - Plavix is being held off.  Dyslipidemia - We will continue statin therapy.  Essential hypertension - We will continue Norvasc and Lopressor.   DVT prophylaxis: Lovenox.  Advanced Care Planning:  Code Status: full code.  Family Communication:  The plan of care was discussed in details with the patient (and family). I answered all questions. The patient agreed to proceed with the above mentioned plan. Further management will depend upon hospital course. Disposition Plan: Back to previous home environment Consults called: none.  All the records are reviewed and case discussed with ED provider.  Status is: Inpatient  At the time of the admission, it appears that the appropriate admission status for this patient is inpatient.  This is judged to be reasonable and necessary in order to provide the required intensity of service to ensure the patient's safety given the presenting symptoms, physical exam findings and initial radiographic and laboratory data in the context of comorbid conditions.  The patient requires inpatient status due to high intensity of service, high risk of further deterioration and high frequency of surveillance required.  I certify that at the time of admission, it is my clinical judgment that the patient will require inpatient hospital care extending more than 2 midnights.                             Dispo: The patient is from: Home              Anticipated d/c is to: Home              Patient currently is not medically stable to d/c.              Difficult to place patient: No  Christel Mormon M.D on 01/03/2022 at 5:19 AM  [facility]   From 7 PM-7 AM, contact night-coverage [email or web address]  CC: Primary care physician; Jonetta Osgood, NP

## 2022-01-03 NOTE — Assessment & Plan Note (Addendum)
This is secondary to accidental mechanical fall. At baseline, doesn't need cane or walker, she's independent Plain films with L femoral neck fracture with impaction at the fracture site CT C spine without acute fracture, head CT acute intracranial pathology Orthopedics planning for surgery tomorrow due to plavix Holding plavix for now Echo 09/2020 with ef 57 Will get plain films of L elbow with abrasion noted from fall RCRI at least 1 for hx CAD with hx CABG.  30 day risk of MACE 6%.  She's requiring 2 L O2, possibly related to COPD (no wheezing suggestive of exacerbation), which increases her risk.  Discussed with pt/sons.  Planning for surgery per orthopedics.

## 2022-01-03 NOTE — ED Notes (Signed)
Pt fell over cat and landed on left hip. Pt will not straighten left leg out.

## 2022-01-03 NOTE — Assessment & Plan Note (Signed)
Follow renal US Continue IVF Baseline creatinine 1.3, CKD IIIb

## 2022-01-03 NOTE — Assessment & Plan Note (Addendum)
Hx CABG Holding plavix for now Continue metoprolol, lipitor

## 2022-01-03 NOTE — Progress Notes (Addendum)
PROGRESS NOTE    Olivia Lane  EGB:[id number] DOB: [birth year] DOA: 01/02/2022 PCP: Jonetta Osgood, NP  Chief Complaint  Patient presents with   Fall    Brief Narrative:  Olivia Lane is Olivia Lane 86 y.o. Caucasian female with medical history significant for GERD, hypertension, COPD CAD, PAD, paroxysmal atrial fibrillation, on Eliquis and dyslipidemia, who presented to the emergency room after having Olivia Lane mechanical fall tripping over her cat. She was noted to have Olivia Wrobleski left hip fracture.  See below for additional details     Assessment & Plan:   Principal Problem:   Closed left hip fracture ([place]) Active Problems:   Acute respiratory failure with hypoxia (HCC)   Chronic obstructive pulmonary disease (COPD) (HCC)   AKI (acute kidney injury) ([place])   Coronary artery disease   PVD (peripheral vascular disease) (HCC)   Leukocytosis   Essential hypertension   Dyslipidemia   Stage 3b chronic kidney disease (CKD) ([place])   Assessment and Plan: * Closed left hip fracture ([place]) This is secondary to accidental mechanical fall. At baseline, doesn't need cane or walker, she's independent Plain films with L femoral neck fracture with impaction at the fracture site CT C spine without acute fracture, head CT acute intracranial pathology Orthopedics planning for surgery tomorrow due to plavix Holding plavix for now Echo 09/2020 with ef 57 Will get plain films of L elbow with abrasion noted from fall RCRI at least 1 for hx CAD with hx CABG.  30 day risk of MACE 6%.  She's requiring 2 L O2, possibly related to COPD (no wheezing suggestive of exacerbation), which increases her risk.  Discussed with pt/sons.  Planning for surgery per orthopedics.   Acute respiratory failure with hypoxia (HCC) Will follow CT chest without contrast (CT c spine with mild LUL atelectasis and/or infiltrate) She's desatting to 80's on RA Possibly due to COPD and pain meds, will follow and w/u additionally as  needed  AKI (acute kidney injury) ([place]) Follow renal US Continue IVF Baseline creatinine 1.3, CKD IIIb  Chronic obstructive pulmonary disease (COPD) (HCC) No signs exacerbation, continue home meds  Coronary artery disease Hx CABG Holding plavix for now Continue metoprolol, lipitor  Leukocytosis Stress response? trend  PVD (peripheral vascular disease) ([place]) Needs follow up with vascular surgery with kinked vascular stent within superficial femoral artery  Essential hypertension We will continue Norvasc and Lopressor.  Dyslipidemia We will continue statin therapy.      DVT prophylaxis: SCD Code Status: full Family Communication: none Disposition:   Status is: Inpatient Remains inpatient appropriate because: pending ortho   Consultants:  ortho  Procedures:  none  Antimicrobials:  Anti-infectives (From admission, onward)    Start     Dose/Rate Route Frequency Ordered Stop   01/04/22 1600  ceFAZolin (ANCEF) IVPB 1 g/50 mL premix        1 g 100 mL/hr over 30 Minutes Intravenous  Once 01/03/22 0642         Subjective: C/o pain  Objective: Vitals:   01/03/22 1300 01/03/22 1330 01/03/22 1551 01/03/22 1736  BP: (!) 118/54 (!) 121/56 (!) 102/59 (!) 91/48  Pulse: 71 71 79 72  Resp: '11 11 16   '$ Temp:   98.3 F (36.8 C) 97.9 F (36.6 C)  TempSrc:   Oral   SpO2: 100% 99% 97% 96%  Weight:      Height:       No intake or output data in the 24 hours ending 01/03/22 1835 Filed  Weights   01/02/22 2215  Weight: 47.3 kg    Examination:  General exam: Appears calm and comfortable  Respiratory system: Clear to auscultation. Respiratory effort normal. Cardiovascular system: RRR Gastrointestinal system: Abdomen is nondistended, soft and nontender Central nervous system: Alert and oriented. No focal neurological deficits. Extremities: no LEE, palpable distal pulses, LLE shortened, externally rotated    Data Reviewed: I have personally reviewed following  labs and imaging studies  CBC: Recent Labs  Lab 01/02/22 2325 01/03/22 0412  WBC 11.1* 16.5*  NEUTROABS 8.4*  --   HGB 10.7* 11.2*  HCT 34.9* 37.7  MCV 87.7 89.3  PLT 297 258    Basic Metabolic Panel: Recent Labs  Lab 01/03/22 0035 01/03/22 0412  NA 139 141  K 4.5 4.8  CL 110 111  CO2 24 24  GLUCOSE 125* 131*  BUN 17 18  CREATININE 1.69* 1.71*  CALCIUM 8.4* 8.7*    GFR: Estimated Creatinine Clearance: 18.3 mL/min (Olivia Lane) (by C-G formula based on SCr of 1.71 mg/dL (H)).  Liver Function Tests: Recent Labs  Lab 01/03/22 0035  AST 19  ALT 12  ALKPHOS 96  BILITOT 0.5  PROT 6.8  ALBUMIN 3.2*    CBG: No results for input(s): "GLUCAP" in the last 168 hours.   Recent Results (from the past 240 hour(s))  Surgical PCR screen     Status: Abnormal   Collection Time: 01/03/22  3:00 PM   Specimen: Nasal Mucosa; Nasal Swab  Result Value Ref Range Status   MRSA, PCR NEGATIVE NEGATIVE Final   Staphylococcus aureus POSITIVE (Olivia Lane) NEGATIVE Final    Comment: (NOTE) The Xpert SA Assay (FDA approved for NASAL specimens in patients 17 years of age and older), is one component of Olivia Lane comprehensive surveillance program. It is not intended to diagnose infection nor to guide or monitor treatment. Performed at [facility], [address], [place], [place] [address]          Radiology Studies: DG Hip Olivia Lane or Wo Pelvis 2-3 Views Left  Result Date: 01/02/2022 CLINICAL DATA:  Recent fall with left hip pain, initial encounter EXAM: DG HIP (WITH OR WITHOUT PELVIS) 3V LEFT COMPARISON:  None Available. FINDINGS: Pelvic ring is intact. Diffuse vascular stenting is noted. Some apparent kinking of 1 of the stents is noted in the mid left thigh. This appears stable from 2014. Left femoral neck fracture is noted with mild impaction at the fracture site. IMPRESSION: Left femoral neck fracture with impaction at the fracture site. No other focal abnormality is seen. Kinked vascular  stent within the superficial femoral artery on the left stable from 2014. Electronically Signed   By: Inez Catalina M.D.   On: 01/02/2022 23:08   CT Cervical Spine Wo Contrast  Result Date: 01/02/2022 CLINICAL DATA:  Status post fall. EXAM: CT CERVICAL SPINE WITHOUT CONTRAST TECHNIQUE: Multidetector CT imaging of the cervical spine was performed without intravenous contrast. Multiplanar CT image reconstructions were also generated. RADIATION DOSE REDUCTION: This exam was performed according to the departmental dose-optimization program which includes automated exposure control, adjustment of the mA and/or kV according to patient size and/or use of iterative reconstruction technique. COMPARISON:  None Available. FINDINGS: Alignment: Normal. Skull base and vertebrae: No acute fracture. No primary bone lesion or focal pathologic process. Soft tissues and spinal canal: No prevertebral fluid or swelling. No visible canal hematoma. Disc levels: Mild posterior endplate sclerosis is seen at the level of C4-C5. Mild anterior osteophyte formation is present at  the levels of C3-C4 and C4-C5. There is marked severity narrowing of the anterior atlantoaxial articulation. Marked severity posterior intervertebral disc space narrowing is seen at the levels of C2-C3, C3-C4 and C4-C5. Moderate severity changes are seen at the levels of C5-C6 and C6-C7. Bilateral moderate severity multilevel facet joint hypertrophy is noted. Upper chest: There is marked severity emphysematous lung disease with mild atelectasis and/or infiltrate seen along the medial aspect of the left upper lobe. Olivia Lane small, approximately 2.0 cm x 0.6 cm area of adjacent pleural thickening is seen. Other: None. IMPRESSION: 1. No acute fracture or subluxation of the cervical spine. 2. Marked severity multilevel degenerative changes, as described above. 3. Marked severity emphysematous lung disease. 4. Mild left upper lobe atelectasis and/or infiltrate with an adjacent  area focal pleural thickening. Correlation with outpatient chest CT is recommended Emphysema (ICD10-J43.9). Electronically Signed   By: Virgina Norfolk M.D.   On: 01/02/2022 23:07   DG Chest Portable 1 View  Result Date: 01/02/2022 CLINICAL DATA:  Recent trip and fall over CT with chest pain, initial encounter EXAM: PORTABLE CHEST 1 VIEW COMPARISON:  08/13/2018 FINDINGS: Cardiac shadow is stable. Postsurgical changes are again seen. Epicardial pacing wires are noted. Lungs are well aerated without focal infiltrate. No acute bony abnormality is noted. IMPRESSION: No acute abnormality noted. Electronically Signed   By: Inez Catalina M.D.   On: 01/02/2022 23:06   CT HEAD WO CONTRAST (5MM)  Result Date: 01/02/2022 CLINICAL DATA:  Status post fall. EXAM: CT HEAD WITHOUT CONTRAST TECHNIQUE: Contiguous axial images were obtained from the base of the skull through the vertex without intravenous contrast. RADIATION DOSE REDUCTION: This exam was performed according to the departmental dose-optimization program which includes automated exposure control, adjustment of the mA and/or kV according to patient size and/or use of iterative reconstruction technique. COMPARISON:  None Available. FINDINGS: Brain: There is mild cerebral atrophy with widening of the extra-axial spaces and ventricular dilatation. There are areas of decreased attenuation within the white matter tracts of the supratentorial brain, consistent with microvascular disease changes. Small, bilateral chronic basal ganglia lacunar infarcts are seen. Vascular: No hyperdense vessel or unexpected calcification. Skull: Normal. Negative for fracture or focal lesion. Sinuses/Orbits: Moderate severity proximal left ethmoid sinus mucosal thickening is seen. Other: None. IMPRESSION: 1. No acute intracranial pathology. 2. Generalized cerebral atrophy with chronic white matter small vessel ischemia. Electronically Signed   By: Virgina Norfolk M.D.   On: 01/02/2022  22:57        Scheduled Meds:  acetaminophen  1,000 mg Oral Q8H   amLODipine  10 mg Oral Daily   atorvastatin  40 mg Oral QHS   clopidogrel  75 mg Oral Daily   feeding supplement  237 mL Oral BID BM   metoprolol tartrate  50 mg Oral Q12H   [START ON 01/04/2022] mometasone-formoterol  2 puff Inhalation BID   multivitamin with minerals  1 tablet Oral Daily   pantoprazole  40 mg Oral BID   polyethylene glycol  17 g Oral BID   [START ON 01/07/2022] Vitamin D (Ergocalciferol)  50,000 Units Oral Q7 days   Continuous Infusions:  [START ON 01/04/2022]  ceFAZolin (ANCEF) IV       LOS: 0 days    Time spent: over 30 min    Olivia Helper, MD [facility]   To contact the attending provider between 7A-7P or the covering provider during after hours 7P-7A, please log into the web site [email or web address] and access using universal [facility] password for that web site. If you do not have the password, please call the hospital operator.  01/03/2022, 6:35 PM

## 2022-01-03 NOTE — Hospital Course (Signed)
Olivia Lane Olivia Lane is Olivia Lane 86 y.o. Caucasian female with medical history significant for GERD, hypertension, COPD CAD, PAD, paroxysmal atrial fibrillation, on Eliquis and dyslipidemia, who presented to the emergency room after having Olivia Lane mechanical fall tripping over her cat. She was noted to have Olivia Lane left hip fracture.  See below for additional details

## 2022-01-04 ENCOUNTER — Inpatient Hospital Stay: Payer: Medicare Other | Admitting: Anesthesiology

## 2022-01-04 ENCOUNTER — Inpatient Hospital Stay: Payer: Medicare Other

## 2022-01-04 ENCOUNTER — Encounter: Payer: Self-pay | Admitting: Family Medicine

## 2022-01-04 ENCOUNTER — Other Ambulatory Visit: Payer: Self-pay

## 2022-01-04 ENCOUNTER — Encounter
Admission: EM | Disposition: A | Discharge status: Skilled Nursing Facility | Payer: Self-pay | Source: Home / Self Care | Attending: Internal Medicine

## 2022-01-04 DIAGNOSIS — N1832 Chronic kidney disease, stage 3b: Secondary | ICD-10-CM

## 2022-01-04 DIAGNOSIS — N179 Acute kidney failure, unspecified: Secondary | ICD-10-CM

## 2022-01-04 DIAGNOSIS — D72829 Elevated white blood cell count, unspecified: Secondary | ICD-10-CM

## 2022-01-04 DIAGNOSIS — J449 Chronic obstructive pulmonary disease, unspecified: Secondary | ICD-10-CM | POA: Diagnosis not present

## 2022-01-04 DIAGNOSIS — S72002A Fracture of unspecified part of neck of left femur, initial encounter for closed fracture: Secondary | ICD-10-CM | POA: Diagnosis not present

## 2022-01-04 DIAGNOSIS — J9601 Acute respiratory failure with hypoxia: Secondary | ICD-10-CM

## 2022-01-04 DIAGNOSIS — I739 Peripheral vascular disease, unspecified: Secondary | ICD-10-CM

## 2022-01-04 DIAGNOSIS — E875 Hyperkalemia: Secondary | ICD-10-CM

## 2022-01-04 HISTORY — PX: HIP ARTHROPLASTY: SHX981

## 2022-01-04 LAB — COMPREHENSIVE METABOLIC PANEL
ALT: 10 U/L (ref 0–44)
AST: 19 U/L (ref 15–41)
Albumin: 2.8 g/dL — ABNORMAL LOW (ref 3.5–5.0)
Alkaline Phosphatase: 83 U/L (ref 38–126)
Anion gap: 7 (ref 5–15)
BUN: 30 mg/dL — ABNORMAL HIGH (ref 8–23)
CO2: 22 mmol/L (ref 22–32)
Calcium: 8.5 mg/dL — ABNORMAL LOW (ref 8.9–10.3)
Chloride: 107 mmol/L (ref 98–111)
Creatinine, Ser: 2.31 mg/dL — ABNORMAL HIGH (ref 0.44–1.00)
GFR, Estimated: 20 mL/min — ABNORMAL LOW (ref >60)
Glucose, Bld: 106 mg/dL — ABNORMAL HIGH (ref 70–99)
Potassium: 5.7 mmol/L — ABNORMAL HIGH (ref 3.5–5.1)
Sodium: 136 mmol/L (ref 135–145)
Total Bilirubin: 0.8 mg/dL (ref 0.3–1.2)
Total Protein: 6.3 g/dL — ABNORMAL LOW (ref 6.5–8.1)

## 2022-01-04 LAB — CBC WITH DIFFERENTIAL/PLATELET
Abs Immature Granulocytes: 0.06 10*3/uL (ref 0.00–0.07)
Basophils Absolute: 0.1 10*3/uL (ref 0.0–0.1)
Basophils Relative: 1 %
Eosinophils Absolute: 1 10*3/uL — ABNORMAL HIGH (ref 0.0–0.5)
Eosinophils Relative: 7 %
HCT: 35.7 % — ABNORMAL LOW (ref 36.0–46.0)
Hemoglobin: 10.9 g/dL — ABNORMAL LOW (ref 12.0–15.0)
Immature Granulocytes: 0 %
Lymphocytes Relative: 10 %
Lymphs Abs: 1.3 10*3/uL (ref 0.7–4.0)
MCH: 26.9 pg (ref 26.0–34.0)
MCHC: 30.5 g/dL (ref 30.0–36.0)
MCV: 88.1 fL (ref 80.0–100.0)
Monocytes Absolute: 0.7 10*3/uL (ref 0.1–1.0)
Monocytes Relative: 5 %
Neutro Abs: 10.5 10*3/uL — ABNORMAL HIGH (ref 1.7–7.7)
Neutrophils Relative %: 77 %
Platelets: 318 10*3/uL (ref 150–400)
RBC: 4.05 MIL/uL (ref 3.87–5.11)
RDW: 17.2 % — ABNORMAL HIGH (ref 11.5–15.5)
WBC: 13.7 10*3/uL — ABNORMAL HIGH (ref 4.0–10.5)
nRBC: 0 % (ref 0.0–0.2)

## 2022-01-04 LAB — PHOSPHORUS: Phosphorus: 4.6 mg/dL (ref 2.5–4.6)

## 2022-01-04 LAB — MAGNESIUM: Magnesium: 1.8 mg/dL (ref 1.7–2.4)

## 2022-01-04 LAB — POTASSIUM: Potassium: 5.2 mmol/L — ABNORMAL HIGH (ref 3.5–5.1)

## 2022-01-04 SURGERY — HEMIARTHROPLASTY, HIP, DIRECT ANTERIOR APPROACH, FOR FRACTURE
Anesthesia: General | Site: Hip | Laterality: Left

## 2022-01-04 MED ORDER — PHENYLEPHRINE HCL-NACL 20-0.9 MG/250ML-% IV SOLN
INTRAVENOUS | Status: DC | PRN
  Administered 2022-01-04: 30 ug/min via INTRAVENOUS

## 2022-01-04 MED ORDER — FLEET ENEMA 7-19 GM/118ML RE ENEM: 1.0000 | ENEMA | Freq: Once | RECTAL | Status: DC | PRN

## 2022-01-04 MED ORDER — ONDANSETRON HCL 4 MG/2ML IJ SOLN: 4.0000 mg | Freq: Four times a day (QID) | INTRAMUSCULAR | Status: DC | PRN

## 2022-01-04 MED ORDER — ONDANSETRON HCL 4 MG PO TABS: 4.0000 mg | ORAL_TABLET | Freq: Four times a day (QID) | ORAL | Status: DC | PRN

## 2022-01-04 MED ORDER — ACETAMINOPHEN 10 MG/ML IV SOLN
INTRAVENOUS | Status: AC
  Filled 2022-01-04: qty 100

## 2022-01-04 MED ORDER — ASPIRIN 81 MG PO CHEW
81.0000 mg | CHEWABLE_TABLET | Freq: Two times a day (BID) | ORAL | Status: DC
  Administered 2022-01-04 – 2022-01-07 (×3): 81 mg via ORAL
  Filled 2022-01-04 (×6): qty 1

## 2022-01-04 MED ORDER — BISACODYL 10 MG RE SUPP: 10.0000 mg | Freq: Every day | RECTAL | Status: DC | PRN

## 2022-01-04 MED ORDER — OXYCODONE HCL 5 MG PO TABS: 5.0000 mg | ORAL_TABLET | Freq: Once | ORAL | Status: DC | PRN

## 2022-01-04 MED ORDER — FENTANYL CITRATE (PF) 100 MCG/2ML IJ SOLN
INTRAMUSCULAR | Status: AC
  Filled 2022-01-04: qty 2

## 2022-01-04 MED ORDER — PHENYLEPHRINE HCL (PRESSORS) 10 MG/ML IV SOLN
INTRAVENOUS | Status: DC | PRN
  Administered 2022-01-04: 160 ug via INTRAVENOUS

## 2022-01-04 MED ORDER — 0.9 % SODIUM CHLORIDE (POUR BTL) OPTIME
TOPICAL | Status: DC | PRN
  Administered 2022-01-04: 1000 mL

## 2022-01-04 MED ORDER — DOCUSATE SODIUM 100 MG PO CAPS
100.0000 mg | ORAL_CAPSULE | Freq: Two times a day (BID) | ORAL | Status: DC
  Administered 2022-01-04 – 2022-01-07 (×4): 100 mg via ORAL
  Filled 2022-01-04 (×6): qty 1

## 2022-01-04 MED ORDER — DIPHENHYDRAMINE HCL 12.5 MG/5ML PO ELIX: 12.5000 mg | ORAL_SOLUTION | ORAL | Status: DC | PRN

## 2022-01-04 MED ORDER — LIDOCAINE HCL (CARDIAC) PF 100 MG/5ML IV SOSY
PREFILLED_SYRINGE | INTRAVENOUS | Status: DC | PRN
  Administered 2022-01-04: 60 mg via INTRAVENOUS

## 2022-01-04 MED ORDER — BUPIVACAINE-EPINEPHRINE 0.25% -1:200000 IJ SOLN
INTRAMUSCULAR | Status: DC | PRN
  Administered 2022-01-04: 30 mL

## 2022-01-04 MED ORDER — LIDOCAINE HCL (PF) 2 % IJ SOLN
INTRAMUSCULAR | Status: AC
  Filled 2022-01-04: qty 5

## 2022-01-04 MED ORDER — ROCURONIUM BROMIDE 100 MG/10ML IV SOLN
INTRAVENOUS | Status: DC | PRN
  Administered 2022-01-04: 10 mg via INTRAVENOUS
  Administered 2022-01-04: 30 mg via INTRAVENOUS

## 2022-01-04 MED ORDER — FENTANYL CITRATE (PF) 100 MCG/2ML IJ SOLN: 25.0000 ug | INTRAMUSCULAR | Status: DC | PRN

## 2022-01-04 MED ORDER — PHENOL 1.4 % MT LIQD: 1.0000 | OROMUCOSAL | Status: DC | PRN

## 2022-01-04 MED ORDER — ALUM & MAG HYDROXIDE-SIMETH 200-200-20 MG/5ML PO SUSP: 30.0000 mL | ORAL | Status: DC | PRN

## 2022-01-04 MED ORDER — ROCURONIUM BROMIDE 10 MG/ML (PF) SYRINGE
PREFILLED_SYRINGE | INTRAVENOUS | Status: AC
  Filled 2022-01-04: qty 10

## 2022-01-04 MED ORDER — SUGAMMADEX SODIUM 200 MG/2ML IV SOLN
INTRAVENOUS | Status: DC | PRN
  Administered 2022-01-04: 200 mg via INTRAVENOUS

## 2022-01-04 MED ORDER — METHOCARBAMOL 1000 MG/10ML IJ SOLN: 500.0000 mg | Freq: Four times a day (QID) | INTRAVENOUS | Status: DC | PRN

## 2022-01-04 MED ORDER — OXYCODONE HCL 5 MG/5ML PO SOLN: 5.0000 mg | Freq: Once | ORAL | Status: DC | PRN

## 2022-01-04 MED ORDER — CEFAZOLIN SODIUM-DEXTROSE 1-4 GM/50ML-% IV SOLN
1.0000 g | Freq: Three times a day (TID) | INTRAVENOUS | Status: AC
  Administered 2022-01-05 (×2): 1 g via INTRAVENOUS
  Filled 2022-01-04 (×2): qty 50

## 2022-01-04 MED ORDER — IPRATROPIUM-ALBUTEROL 0.5-2.5 (3) MG/3ML IN SOLN
RESPIRATORY_TRACT | Status: AC
  Filled 2022-01-04: qty 3

## 2022-01-04 MED ORDER — IPRATROPIUM-ALBUTEROL 0.5-2.5 (3) MG/3ML IN SOLN
3.0000 mL | Freq: Once | RESPIRATORY_TRACT | Status: AC
  Administered 2022-01-04: 3 mL via RESPIRATORY_TRACT

## 2022-01-04 MED ORDER — SURGIPHOR WOUND IRRIGATION SYSTEM - OPTIME
TOPICAL | Status: DC | PRN
  Administered 2022-01-04: 450 mL via TOPICAL

## 2022-01-04 MED ORDER — FENTANYL CITRATE (PF) 100 MCG/2ML IJ SOLN
INTRAMUSCULAR | Status: DC | PRN
Start: 2022-01-04 — End: 2022-01-04
  Administered 2022-01-04: 25 ug via INTRAVENOUS

## 2022-01-04 MED ORDER — ACETAMINOPHEN 10 MG/ML IV SOLN
INTRAVENOUS | Status: DC | PRN
  Administered 2022-01-04: 700 mg via INTRAVENOUS

## 2022-01-04 MED ORDER — BUPIVACAINE-EPINEPHRINE (PF) 0.25% -1:200000 IJ SOLN
INTRAMUSCULAR | Status: AC
  Filled 2022-01-04: qty 30

## 2022-01-04 MED ORDER — HEMOSTATIC AGENTS (NO CHARGE) OPTIME
TOPICAL | Status: DC | PRN
  Administered 2022-01-04: 2 via TOPICAL

## 2022-01-04 MED ORDER — PHENYLEPHRINE HCL-NACL 20-0.9 MG/250ML-% IV SOLN
INTRAVENOUS | Status: AC
  Filled 2022-01-04: qty 250

## 2022-01-04 MED ORDER — ONDANSETRON HCL 4 MG/2ML IJ SOLN
INTRAMUSCULAR | Status: AC
  Filled 2022-01-04: qty 2

## 2022-01-04 MED ORDER — PROPOFOL 1000 MG/100ML IV EMUL
INTRAVENOUS | Status: AC
  Filled 2022-01-04: qty 100

## 2022-01-04 MED ORDER — MAGNESIUM HYDROXIDE 400 MG/5ML PO SUSP
30.0000 mL | Freq: Every day | ORAL | Status: DC
  Administered 2022-01-04 – 2022-01-06 (×2): 30 mL via ORAL
  Filled 2022-01-04 (×2): qty 30

## 2022-01-04 MED ORDER — SODIUM CHLORIDE 0.9 % IV SOLN
INTRAVENOUS | Status: AC | PRN
  Administered 2022-01-04: 250 mL via INTRAMUSCULAR

## 2022-01-04 MED ORDER — METHOCARBAMOL 500 MG PO TABS
500.0000 mg | ORAL_TABLET | Freq: Four times a day (QID) | ORAL | Status: DC | PRN
  Administered 2022-01-06: 500 mg via ORAL
  Filled 2022-01-04 (×2): qty 1

## 2022-01-04 MED ORDER — CHLORHEXIDINE GLUCONATE CLOTH 2 % EX PADS
6.0000 | MEDICATED_PAD | Freq: Every day | CUTANEOUS | Status: DC
  Administered 2022-01-04: 6 via TOPICAL

## 2022-01-04 MED ORDER — PROPOFOL 10 MG/ML IV BOLUS
INTRAVENOUS | Status: DC | PRN
  Administered 2022-01-04: 50 mg via INTRAVENOUS

## 2022-01-04 MED ORDER — MENTHOL 3 MG MT LOZG
1.0000 | LOZENGE | OROMUCOSAL | Status: DC | PRN
Start: 2022-01-04 — End: 2022-01-07

## 2022-01-04 MED ORDER — SODIUM CHLORIDE 0.9 % IV SOLN: INTRAVENOUS | Status: DC

## 2022-01-04 MED ORDER — DEXAMETHASONE SODIUM PHOSPHATE 10 MG/ML IJ SOLN
INTRAMUSCULAR | Status: DC | PRN
  Administered 2022-01-04: 10 mg via INTRAVENOUS

## 2022-01-04 MED ORDER — DEXAMETHASONE SODIUM PHOSPHATE 10 MG/ML IJ SOLN
INTRAMUSCULAR | Status: AC
  Filled 2022-01-04: qty 1

## 2022-01-04 MED ORDER — PROPOFOL 10 MG/ML IV BOLUS
INTRAVENOUS | Status: AC
  Filled 2022-01-04: qty 20

## 2022-01-04 SURGICAL SUPPLY — 59 items
BLADE SAGITTAL AGGR TOOTH XLG (BLADE) ×2 IMPLANT
BNDG COHESIVE 6X5 TAN ST LF (GAUZE/BANDAGES/DRESSINGS) ×5 IMPLANT
BOWL CEMENT MIXING ADV NOZZLE (MISCELLANEOUS) ×1 IMPLANT
CANISTER WOUND CARE 500ML ATS (WOUND CARE) ×2 IMPLANT
CEMENT BONE 40GM (Cement) ×2 IMPLANT
CEMENT RESTRICTOR DEPUY SZ 3 (Cement) ×1 IMPLANT
CHLORAPREP W/TINT 26 (MISCELLANEOUS) ×2 IMPLANT
COVER BACK TABLE REUSABLE LG (DRAPES) ×2 IMPLANT
DRAPE 3/4 80X56 (DRAPES) ×5 IMPLANT
DRAPE C-ARM XRAY 36X54 (DRAPES) ×2 IMPLANT
DRAPE INCISE IOBAN 66X60 STRL (DRAPES) IMPLANT
DRAPE POUCH INSTRU U-SHP 10X18 (DRAPES) ×2 IMPLANT
DRESSING SURGICEL FIBRLLR 1X2 (HEMOSTASIS) ×2 IMPLANT
DRSG MEPILEX SACRM 8.7X9.8 (GAUZE/BANDAGES/DRESSINGS) ×2 IMPLANT
DRSG SURGICEL FIBRILLAR 1X2 (HEMOSTASIS) ×4
ELECT BLADE 6.5 EXT (BLADE) ×2 IMPLANT
ELECT REM PT RETURN 9FT ADLT (ELECTROSURGICAL) ×2
ELECTRODE REM PT RTRN 9FT ADLT (ELECTROSURGICAL) ×1 IMPLANT
GLOVE BIOGEL PI IND STRL 9 (GLOVE) ×1 IMPLANT
GLOVE BIOGEL PI INDICATOR 9 (GLOVE) ×1
GLOVE SURG SYN 9.0  PF PI (GLOVE) ×2
GLOVE SURG SYN 9.0 PF PI (GLOVE) ×2 IMPLANT
GOWN SRG 2XL LVL 4 RGLN SLV (GOWNS) ×1 IMPLANT
GOWN STRL NON-REIN 2XL LVL4 (GOWNS) ×1
GOWN STRL REUS W/ TWL LRG LVL3 (GOWN DISPOSABLE) ×1 IMPLANT
GOWN STRL REUS W/TWL LRG LVL3 (GOWN DISPOSABLE) ×1
HEAD BIPOLARE SZ46 HEAD 28 (Head) ×1 IMPLANT
HIP FEM HD S 28 (Head) ×1 IMPLANT
HIP STEM FEM 3 STD (Stem) ×1 IMPLANT
HOLDER FOLEY CATH W/STRAP (MISCELLANEOUS) ×2 IMPLANT
HOOD PEEL AWAY FLYTE STAYCOOL (MISCELLANEOUS) ×2 IMPLANT
KIT PREVENA INCISION MGT 13 (CANNISTER) ×2 IMPLANT
KNIFE SCULPS 14X20 (INSTRUMENTS) ×1 IMPLANT
MANIFOLD NEPTUNE II (INSTRUMENTS) ×2 IMPLANT
MAT ABSORB  FLUID 56X50 GRAY (MISCELLANEOUS) ×1
MAT ABSORB FLUID 56X50 GRAY (MISCELLANEOUS) ×1 IMPLANT
NDL SPNL 20GX3.5 QUINCKE YW (NEEDLE) ×2 IMPLANT
NEEDLE SPNL 20GX3.5 QUINCKE YW (NEEDLE) ×4 IMPLANT
NOZZLE FLEX THIN (MISCELLANEOUS) ×1 IMPLANT
NS IRRIG 1000ML POUR BTL (IV SOLUTION) ×2 IMPLANT
PACK HIP COMPR (MISCELLANEOUS) ×2 IMPLANT
PRESSURIZER CEMENT PROX FEM SM (MISCELLANEOUS) ×1 IMPLANT
SCALPEL PROTECTED #10 DISP (BLADE) ×4 IMPLANT
SOLUTION IRRIG SURGIPHOR (IV SOLUTION) ×2 IMPLANT
STAPLER SKIN PROX 35W (STAPLE) ×2 IMPLANT
STRAP SAFETY 5IN WIDE (MISCELLANEOUS) ×2 IMPLANT
SUT DVC 2 QUILL PDO  T11 36X36 (SUTURE) ×1
SUT DVC 2 QUILL PDO T11 36X36 (SUTURE) ×1 IMPLANT
SUT SILK 0 (SUTURE) ×1
SUT SILK 0 30XBRD TIE 6 (SUTURE) ×1 IMPLANT
SUT V-LOC 90 ABS DVC 3-0 CL (SUTURE) ×2 IMPLANT
SUT VIC AB 1 CT1 36 (SUTURE) ×2 IMPLANT
SYR 50ML LL SCALE MARK (SYRINGE) ×4 IMPLANT
SYR BULB IRRIG 60ML STRL (SYRINGE) ×2 IMPLANT
TAPE MICROFOAM 4IN (TAPE) IMPLANT
TOWEL OR 17X26 4PK STRL BLUE (TOWEL DISPOSABLE) IMPLANT
TRAY FOLEY MTR SLVR 16FR STAT (SET/KITS/TRAYS/PACK) ×1 IMPLANT
WAND WEREWOLF FASTSEAL 6.0 (MISCELLANEOUS) ×1 IMPLANT
WATER STERILE IRR 1000ML POUR (IV SOLUTION) ×2 IMPLANT

## 2022-01-04 NOTE — Anesthesia Procedure Notes (Signed)
Procedure Name: Intubation Date/Time: 01/04/2022 5:42 PM  Performed by: Debe Coder, CRNAPre-anesthesia Checklist: Patient identified, Emergency Drugs available, Suction available and Patient being monitored Patient Re-evaluated:Patient Re-evaluated prior to induction Oxygen Delivery Method: Circle system utilized Preoxygenation: Pre-oxygenation with 100% oxygen Induction Type: IV induction Ventilation: Mask ventilation without difficulty Laryngoscope Size: McGraph and 3 Grade View: Grade I Tube type: Oral Tube size: 7.0 mm Number of attempts: 1 Airway Equipment and Method: Stylet and Oral airway Placement Confirmation: ETT inserted through vocal cords under direct vision, positive ETCO2 and breath sounds checked- equal and bilateral Secured at: 21 cm Tube secured with: Tape Dental Injury: Teeth and Oropharynx as per pre-operative assessment

## 2022-01-04 NOTE — Anesthesia Postprocedure Evaluation (Signed)
Anesthesia Post Note  Patient: Jerrell Belfast Yoo  Procedure(s) Performed: ARTHROPLASTY BIPOLAR HIP (HEMIARTHROPLASTY) (Left: Hip)  Patient location during evaluation: PACU Anesthesia Type: General Level of consciousness: awake and alert Pain management: pain level controlled Vital Signs Assessment: post-procedure vital signs reviewed and stable Respiratory status: spontaneous breathing, nonlabored ventilation, respiratory function stable and patient connected to nasal cannula oxygen Cardiovascular status: blood pressure returned to baseline and stable Postop Assessment: no apparent nausea or vomiting Anesthetic complications: no   No notable events documented.   Last Vitals:  Vitals:   01/04/22 1900 01/04/22 1915  BP: (!) 98/46 (!) 92/42  Pulse: 69 65  Resp: 16 16  Temp:  37.2 C  SpO2: 96% 97%    Last Pain:  Vitals:   01/04/22 1852  TempSrc:   PainSc: Asleep                 Precious Haws Alim Cattell

## 2022-01-04 NOTE — Anesthesia Preprocedure Evaluation (Addendum)
Anesthesia Evaluation  Patient identified by MRN, date of birth, ID band Patient awake and Patient confused    Reviewed: Allergy & Precautions, NPO status , Patient's Chart, lab work & pertinent test results, reviewed documented beta blocker date and time   History of Anesthesia Complications Negative for: history of anesthetic complications  Airway Mallampati: II  TM Distance: <3 FB Neck ROM: full    Dental  (+) Poor Dentition, Missing   Pulmonary pneumonia, unresolved, COPD,  COPD inhaler, former smoker,    + rhonchi  + decreased breath sounds      Cardiovascular Exercise Tolerance: Poor hypertension, On Medications + CAD, + CABG and + Peripheral Vascular Disease  + dysrhythmias Atrial Fibrillation  Rhythm:irregular     Neuro/Psych negative neurological ROS  negative psych ROS   GI/Hepatic Neg liver ROS, GERD  Medicated,  Endo/Other  negative endocrine ROS  Renal/GU Renal disease  negative genitourinary   Musculoskeletal   Abdominal   Peds  Hematology negative hematology ROS (+) Blood dyscrasia, anemia ,   Anesthesia Other Findings Past Medical History: No date: Anemia No date: Gastro-esophageal reflux No date: Hyperlipidemia No date: Hypertension Past Surgical History: 2004: APPENDECTOMY 2014: BYPASS GRAFT [id number]: CATARACT EXTRACTION 2004: COLONOSCOPY BMI    Body Mass Index: 19.06 kg/m     Reproductive/Obstetrics negative OB ROS                           Anesthesia Physical Anesthesia Plan  ASA: 4 and emergent  Anesthesia Plan: General   Post-op Pain Management:    Induction: Intravenous  PONV Risk Score and Plan: Ondansetron, Dexamethasone, Midazolam and Treatment may vary due to age or medical condition  Airway Management Planned: Oral ETT  Additional Equipment:   Intra-op Plan:   Post-operative Plan: Extubation in OR and Possible Post-op  intubation/ventilation  Informed Consent: I have reviewed the patients History and Physical, chart, labs and discussed the procedure including the risks, benefits and alternatives for the proposed anesthesia with the patient or authorized representative who has indicated his/her understanding and acceptance.     Dental Advisory Given  Plan Discussed with: CRNA  Anesthesia Plan Comments: (Recent plavix dosing will necessitate GOT.  Patient and son consented for risks of anesthesia including but not limited to:  - adverse reactions to medications - damage to eyes, teeth, lips or other oral mucosa - nerve damage due to positioning  - sore throat or hoarseness - Damage to heart, brain, nerves, lungs, other parts of body or loss of life  They voiced understanding.)      Anesthesia Quick Evaluation

## 2022-01-04 NOTE — Progress Notes (Addendum)
PROGRESS NOTE    Lesslie Mossa Busick  DDU:[id number] DOB: 10/30/1935 DOA: 01/02/2022 PCP: Jonetta Osgood, NP  Chief Complaint  Patient presents with   Fall    Brief Narrative:  Olivia Lane is a 86 . Caucasian female with past medical history of GERD, hypertension, COPD, CAD, peripheral vascular disease, paroxysmal atrial fibrillation on Eliquis and dyslipidemia presented to hospital after sustaining a mechanical fall.  Patient is independent at baseline.  In the ED, patient was noted to have a left hip fracture.  Orthopedic surgery was consulted and patient was admitted to hospital for further evaluation and treatment.  Assessment & Plan:   Principal Problem:   Closed left hip fracture (HCC) Active Problems:   Acute respiratory failure with hypoxia (HCC)   Chronic obstructive pulmonary disease (COPD) (HCC)   AKI (acute kidney injury) ([place])   Coronary artery disease   PVD (peripheral vascular disease) (HCC)   Leukocytosis   Essential hypertension   Dyslipidemia   Stage 3b chronic kidney disease (CKD) (HCC)   Hyperkalemia  Assessment and Plan:  * Closed left hip fracture (HCC) Status post mechanical fall.  Independent at baseline.  X-ray of the neck with left femoral neck fracture.  CT of the head C-spine negative.  Patient was on Plavix at home which is on hold at this time.  RCRI at least 1 for hx CAD with hx CABG.  30 day risk of MACE 6%.  Suggestive required 2 L of oxygen likely secondary to COPD and some atelectasis.  Continue incentive spirometer and supportive care.  Acute respiratory failure with hypoxia (HCC) CT chest without contrast showed marked or severity of emphysematous lung disease with ill-defined left upper lobe scar atelectasis and/or infiltrate.  Further evaluation with nuclear medicine PET/CT was recommended.  Low-attenuation area in the left lower lobe.  Had desaturation up to 80s in room air on presentation.  Continue with steroids, inhalers  nebulizers incentive spirometry .  Will need outpatient follow-up of pulmonary abnormality.  AKI (acute kidney injury) with CKD stage IIIb (HCC) Creatinine today at 2.3.  Baseline around 1.3.  Continue to monitor.  Renal ultrasound without acute findings. On gentle IV fluids.  Check fluid status in AM.  Continue n.p.o. as well.  Mild hyperkalemia.  Avoid potassium rich food.  Closely monitor.  Check BMP in AM.  Latest magnesium level of 1.8 with phosphorus of 4.6   Chronic obstructive pulmonary disease (COPD) (HCC) Currently 2 L of oxygen by nasal cannula.  Continue oxygen nebulizers and inhalers.  No overt wheezing at this time.  Coronary artery disease/Hx CABG Continue metoprolol Lipitor.  Plavix on hold.  Leukocytosis Mild leukocytosis.  WBC at 13.7.  Slightly trended down from yesterday.  Could be reactive.  We will continue to monitor.  PVD (peripheral vascular disease) ([place]) Needs follow up with vascular surgery with kinked vascular stent within superficial femoral artery  Essential hypertension Continue Norvasc and Lopressor.  Dyslipidemia Continue statins   DVT prophylaxis: SCD  Code Status: full code   Family Communication:  I spoke with the patient's son and updated him about the clinical condition of the patient.  Disposition: Uncertain at this time  Status is: Inpatient  Remains inpatient appropriate because: Hip fracture, pending surgical intervention, respiratory failure, hyperkalemia, COPD   Consultants:  Orthopedics  Procedures:  none yet  Antimicrobials:  Anti-infectives (From admission, onward)    Start     Dose/Rate Route Frequency Ordered Stop   01/04/22 1600  ceFAZolin (ANCEF) IVPB  1 g/50 mL premix        1 g 100 mL/hr over 30 Minutes Intravenous  Once 01/03/22 7741         Subjective: Today, patient was seen and examined at bedside.  Complains of moderate hip pain.  Objective: Vitals:   01/03/22 2042 01/04/22 0020 01/04/22 0511  01/04/22 0812  BP: (!) 123/47 (!) 107/46 (!) 123/49 (!) 109/54  Pulse: 73 70 64 64  Resp: '18 20 20 16  '$ Temp: 97.8 F (36.6 C) 98.7 F (37.1 C) 98 F (36.7 C) 98 F (36.7 C)  TempSrc:    Oral  SpO2: 95% 90% 90% (!) 87%  Weight:      Height:        Intake/Output Summary (Last 24 hours) at 01/04/2022 1056 Last data filed at 01/04/2022 0522 Gross per 24 hour  Intake 446.65 ml  Output 400 ml  Net 46.65 ml   Filed Weights   01/02/22 2215  Weight: 47.3 kg   Body mass index is 19.06 kg/m.   Examination: General: Thinly built, not in obvious distress on nasal cannula oxygen HENT:   No scleral pallor or icterus noted. Oral mucosa is moist.  Chest:  Clear breath sounds.  Diminished breath sounds bilaterally. No crackles or wheezes.  CVS: S1 &S2 heard. No murmur.  Regular rate and rhythm. Abdomen: Soft, nontender, nondistended.  Bowel sounds are heard.   Extremities: Left lower extremity shortened and externally rotated. Psych: Alert, awake and oriented, normal mood CNS:  No cranial nerve deficits.   Skin: Warm and dry.  No rashes noted.  Data Reviewed: I have personally reviewed following labs and imaging studies  CBC: Recent Labs  Lab 01/02/22 2325 01/03/22 0412 01/04/22 0407  WBC 11.1* 16.5* 13.7*  NEUTROABS 8.4*  --  10.5*  HGB 10.7* 11.2* 10.9*  HCT 34.9* 37.7 35.7*  MCV 87.7 89.3 88.1  PLT 297 394 287    Basic Metabolic Panel: Recent Labs  Lab 01/03/22 0035 01/03/22 0412 01/04/22 0407  NA 139 141 136  K 4.5 4.8 5.7*  CL 110 111 107  CO2 '24 24 22  '$ GLUCOSE 125* 131* 106*  BUN 17 18 30*  CREATININE 1.69* 1.71* 2.31*  CALCIUM 8.4* 8.7* 8.5*  MG  --   --  1.8  PHOS  --   --  4.6    GFR: Estimated Creatinine Clearance: 13.5 mL/min (A) (by C-G formula based on SCr of 2.31 mg/dL (H)).  Liver Function Tests: Recent Labs  Lab 01/03/22 0035 01/04/22 0407  AST 19 19  ALT 12 10  ALKPHOS 96 83  BILITOT 0.5 0.8  PROT 6.8 6.3*  ALBUMIN 3.2* 2.8*     CBG: No results for input(s): "GLUCAP" in the last 168 hours.   Recent Results (from the past 240 hour(s))  Surgical PCR screen     Status: Abnormal   Collection Time: 01/03/22  3:00 PM   Specimen: Nasal Mucosa; Nasal Swab  Result Value Ref Range Status   MRSA, PCR NEGATIVE NEGATIVE Final   Staphylococcus aureus POSITIVE (A) NEGATIVE Final    Comment: (NOTE) The Xpert SA Assay (FDA approved for NASAL specimens in patients 51 years of age and older), is one component of a comprehensive surveillance program. It is not intended to diagnose infection nor to guide or monitor treatment. Performed at [facility], [address], [place], [place] [address]     adiology Studies: US RENAL  Result Date: 01/04/2022 CLINICAL DATA:  Acute kidney injury. EXAM: RENAL / URINARY TRACT ULTRASOUND COMPLETE COMPARISON:  CT abdomen pelvis dated August 13, 2018. FINDINGS: Right Kidney: Renal measurements: 8.4 x 3.4 x 4.4 cm = volume: 66 mL. Cortical thinning. Echogenicity within normal limits. No mass or hydronephrosis visualized. 1.4 cm simple cyst. No follow-up imaging is recommended. Left Kidney: Renal measurements: 8.6 x 4.2 x 3.4 cm = volume: 63 mL. Cortical thinning. Echogenicity within normal limits. No mass or hydronephrosis visualized. Several simple cysts measuring up to 2.0 cm. No follow-up imaging is recommended. Bladder: Decompressed by Foley catheter. Other: None. IMPRESSION: 1. No acute abnormality. Bilateral renal atrophy/cortical thinning. Electronically Signed   By: Titus Dubin M.D.   On: 01/04/2022 10:47   CT CHEST WO CONTRAST  Result Date: 01/03/2022 CLINICAL DATA:  Respiratory illness. EXAM: CT CHEST WITHOUT CONTRAST TECHNIQUE: Multidetector CT imaging of the chest was performed following the standard protocol without IV contrast. RADIATION DOSE REDUCTION: This exam was performed according to the departmental dose-optimization program which includes automated exposure  control, adjustment of the mA and/or kV according to patient size and/or use of iterative reconstruction technique. COMPARISON:  None Available. FINDINGS: Cardiovascular: There is marked severity calcification of the thoracic aorta, without evidence of aortic aneurysm. There is mild cardiomegaly with marked severity coronary artery calcification. No pericardial effusion. Mediastinum/Nodes: No enlarged mediastinal or axillary lymph nodes. Thyroid gland, trachea, and esophagus demonstrate no significant findings. Lungs/Pleura: There is marked severity emphysematous lung disease. A mild amount of ill-defined scarring, atelectasis and/or infiltrate is seen along the medial aspect of the left upper lobe. A small, approximately 18 mm x 7 mm area of adjacent pleural based low-attenuation is noted (axial CT image 23, CT series 2). A 2.5 cm x 1.0 cm x 0.9 cm area of low attenuation is seen within the anterior aspect of the left lower lobe,, adjacent to the inferior aspect of the oblique fissure (axial CT image 98, CT series 2/sagittal reformatted image 98, CT series 6). This area is not seen within the visualized portion of the left lung base on the prior abdomen and pelvis CT, dated August 13, 2018. Mild lingular scarring and posterior bibasilar atelectasis is seen. There are small bilateral pleural effusions. No pneumothorax is identified. Upper Abdomen: Bilateral simple renal cysts are seen. Musculoskeletal: Multiple sternal wires are present. A chronic compression fracture deformity is seen at the level of T10. Multilevel degenerative changes are noted throughout the thoracic spine. IMPRESSION: 1. Marked severity emphysematous lung disease. 2. Mild amount of ill-defined left upper lobe scarring, atelectasis and/or infiltrate, with adjacent area of pleural based low-attenuation. While this may represent focal pleural thickening, sequelae associated with an underlying neoplastic process cannot be excluded. Further  evaluation with nuclear medicine PET/CT is recommended. 3. 2.5 cm x 1.0 cm x 0.9 cm area of low attenuation within the left lower lobe, as described above. Evaluation with a nuclear medicine PET/CT is again recommended to exclude the presence of a pulmonary neoplasm. 4. Small bilateral pleural effusions. 5. Bilateral simple renal cysts. 6. Chronic compression fracture deformity at the level of T10. Aortic Atherosclerosis (ICD10-I70.0) and Emphysema (ICD10-J43.9). Electronically Signed   By: Virgina Norfolk M.D.   On: 01/03/2022 20:02   DG Elbow 2 Views Left  Result Date: 01/03/2022 CLINICAL DATA:  Status post fall. EXAM: LEFT ELBOW - 2 VIEW COMPARISON:  None Available. FINDINGS: There is no evidence of fracture, dislocation, or joint effusion. There is no evidence of arthropathy or other focal bone abnormality. Mild to  moderate severity posterior soft tissue swelling is seen. IMPRESSION: Posterior soft tissue swelling without evidence of acute osseous abnormality. Electronically Signed   By: Virgina Norfolk M.D.   On: 01/03/2022 19:47   DG Hip Unilat W or Wo Pelvis 2-3 Views Left  Result Date: 01/02/2022 CLINICAL DATA:  Recent fall with left hip pain, initial encounter EXAM: DG HIP (WITH OR WITHOUT PELVIS) 3V LEFT COMPARISON:  None Available. FINDINGS: Pelvic ring is intact. Diffuse vascular stenting is noted. Some apparent kinking of 1 of the stents is noted in the mid left thigh. This appears stable from 2014. Left femoral neck fracture is noted with mild impaction at the fracture site. IMPRESSION: Left femoral neck fracture with impaction at the fracture site. No other focal abnormality is seen. Kinked vascular stent within the superficial femoral artery on the left stable from 2014. Electronically Signed   By: Inez Catalina M.D.   On: 01/02/2022 23:08   CT Cervical Spine Wo Contrast  Result Date: 01/02/2022 CLINICAL DATA:  Status post fall. EXAM: CT CERVICAL SPINE WITHOUT CONTRAST TECHNIQUE:  Multidetector CT imaging of the cervical spine was performed without intravenous contrast. Multiplanar CT image reconstructions were also generated. RADIATION DOSE REDUCTION: This exam was performed according to the departmental dose-optimization program which includes automated exposure control, adjustment of the mA and/or kV according to patient size and/or use of iterative reconstruction technique. COMPARISON:  None Available. FINDINGS: Alignment: Normal. Skull base and vertebrae: No acute fracture. No primary bone lesion or focal pathologic process. Soft tissues and spinal canal: No prevertebral fluid or swelling. No visible canal hematoma. Disc levels: Mild posterior endplate sclerosis is seen at the level of C4-C5. Mild anterior osteophyte formation is present at the levels of C3-C4 and C4-C5. There is marked severity narrowing of the anterior atlantoaxial articulation. Marked severity posterior intervertebral disc space narrowing is seen at the levels of C2-C3, C3-C4 and C4-C5. Moderate severity changes are seen at the levels of C5-C6 and C6-C7. Bilateral moderate severity multilevel facet joint hypertrophy is noted. Upper chest: There is marked severity emphysematous lung disease with mild atelectasis and/or infiltrate seen along the medial aspect of the left upper lobe. A small, approximately 2.0 cm x 0.6 cm area of adjacent pleural thickening is seen. Other: None. IMPRESSION: 1. No acute fracture or subluxation of the cervical spine. 2. Marked severity multilevel degenerative changes, as described above. 3. Marked severity emphysematous lung disease. 4. Mild left upper lobe atelectasis and/or infiltrate with an adjacent area focal pleural thickening. Correlation with outpatient chest CT is recommended Emphysema (ICD10-J43.9). Electronically Signed   By: Virgina Norfolk M.D.   On: 01/02/2022 23:07   DG Chest Portable 1 View  Result Date: 01/02/2022 CLINICAL DATA:  Recent trip and fall over CT with  chest pain, initial encounter EXAM: PORTABLE CHEST 1 VIEW COMPARISON:  08/13/2018 FINDINGS: Cardiac shadow is stable. Postsurgical changes are again seen. Epicardial pacing wires are noted. Lungs are well aerated without focal infiltrate. No acute bony abnormality is noted. IMPRESSION: No acute abnormality noted. Electronically Signed   By: Inez Catalina M.D.   On: 01/02/2022 23:06   CT HEAD WO CONTRAST (5MM)  Result Date: 01/02/2022 CLINICAL DATA:  Status post fall. EXAM: CT HEAD WITHOUT CONTRAST TECHNIQUE: Contiguous axial images were obtained from the base of the skull through the vertex without intravenous contrast. RADIATION DOSE REDUCTION: This exam was performed according to the departmental dose-optimization program which includes automated exposure control, adjustment of the mA and/or kV according  to patient size and/or use of iterative reconstruction technique. COMPARISON:  None Available. FINDINGS: Brain: There is mild cerebral atrophy with widening of the extra-axial spaces and ventricular dilatation. There are areas of decreased attenuation within the white matter tracts of the supratentorial brain, consistent with microvascular disease changes. Small, bilateral chronic basal ganglia lacunar infarcts are seen. Vascular: No hyperdense vessel or unexpected calcification. Skull: Normal. Negative for fracture or focal lesion. Sinuses/Orbits: Moderate severity proximal left ethmoid sinus mucosal thickening is seen. Other: None. IMPRESSION: 1. No acute intracranial pathology. 2. Generalized cerebral atrophy with chronic white matter small vessel ischemia. Electronically Signed   By: Virgina Norfolk M.D.   On: 01/02/2022 22:57     Scheduled Meds:  acetaminophen  1,000 mg Oral Q8H   amLODipine  10 mg Oral Daily   atorvastatin  40 mg Oral QHS   Chlorhexidine Gluconate Cloth  6 each Topical Daily   clopidogrel  75 mg Oral Daily   feeding supplement  237 mL Oral BID BM   metoprolol tartrate  50 mg  Oral Q12H   mometasone-formoterol  2 puff Inhalation BID   multivitamin with minerals  1 tablet Oral Daily   pantoprazole  40 mg Oral BID   polyethylene glycol  17 g Oral BID   [START ON 01/07/2022] Vitamin D (Ergocalciferol)  50,000 Units Oral Q7 days   Continuous Infusions:   ceFAZolin (ANCEF) IV     lactated ringers 75 mL/hr at 01/04/22 0918     LOS: 1 day    Flora Lipps, MD [facility] 01/04/2022, 10:56 AM

## 2022-01-04 NOTE — Progress Notes (Signed)
Patient awake to name only, per Dr. Amie Critchley: anesthesia same as in pre-op. SBP 90-low 100's  per anesthesia ok to transfer back to room. Patient appears comfortable, when questioned Olivia Lane's states she is not having any discomfort.  Will continue to monitor. Family updated.

## 2022-01-04 NOTE — Transfer of Care (Signed)
Immediate Anesthesia Transfer of Care Note  Patient: Olivia Lane  Procedure(s) Performed: ARTHROPLASTY BIPOLAR HIP (HEMIARTHROPLASTY) (Left: Hip)  Patient Location: PACU  Anesthesia Type:General  Level of Consciousness: awake and drowsy  Airway & Oxygen Therapy: Patient Spontanous Breathing and Patient connected to face mask oxygen  Post-op Assessment: Report given to RN and Post -op Vital signs reviewed and stable  Post vital signs: Reviewed and stable  Last Vitals:  Vitals Value Taken Time  BP 99/49 01/04/22 1852  Temp    Pulse 67 01/04/22 1857  Resp 14 01/04/22 1857  SpO2 82 % 01/04/22 1857  Vitals shown include unvalidated device data.  Last Pain:  Vitals:   01/04/22 1633  TempSrc: Oral  PainSc: 0-No pain      Patients Stated Pain Goal: 0 (53/79/43 2761)  Complications: No notable events documented.

## 2022-01-04 NOTE — Op Note (Signed)
01/04/2022  6:59 PM  PATIENT:  Olivia Lane  86 y.o. female  PRE-OPERATIVE DIAGNOSIS:  hip fx displaced left femoral neck  POST-OPERATIVE DIAGNOSIS:  left hip fracture displaced femoral neck  PROCEDURE:  Procedure(s): ARTHROPLASTY BIPOLAR HIP (HEMIARTHROPLASTY) (Left)  SURGEON: Laurene Footman, MD  ASSISTANTS: None  ANESTHESIA:   general  EBL:  Total I/O In: -  Out: 50 [Blood:50]  BLOOD ADMINISTERED:none  DRAINS:  Incisional wound VAC    LOCAL MEDICATIONS USED:  MARCAINE     SPECIMEN:  Source of Specimen:    Femoral head and neck fragments  DISPOSITION OF SPECIMEN:  PATHOLOGY  COUNTS:  YES  TOURNIQUET:  * No tourniquets in log *  IMPLANTS: Medacta cemented AMIS 3 stem with 46 mm bipolar ball and S 28 mm head  DICTATION: .Dragon Dictation   The patient was brought to the operating room and after general anesthesia was obtained patient was placed on the operative table with the ipsilateral foot into the Medacta attachment, contralateral leg on a well-padded table. C-arm was brought in and preop template x-ray taken. After prepping and draping in usual sterile fashion appropriate patient identification and timeout procedures were completed. Anterior approach to the hip was obtained and centered over the greater trochanter and TFL muscle. The subcutaneous tissue was incised hemostasis being achieved by electrocautery. TFL fascia was incised and the muscle retracted laterally deep retractor placed. The lateral femoral circumflex vessels were identified and ligated. The anterior capsule was exposed and a capsulotomy performed. The neck was identified along with the oblique displaced fracture and a femoral neck cut carried out with a saw. The head was removed without difficulty and showed minimal degenerative changes to the femoral head and acetabulum.  Femoral head sized to a 46 and a 46 trial fit well and was chosen for later in the case.  The leg was then externally rotated  and ischiofemoral and pubofemoral releases carried out. The femur was sequentially broached to a size 4,.  A 3 cement restrictor was placed down the canal the canal prepared by irrigation and drying and then cement with pressurization was placed followed by the size 3 standard femoral stem.  When the cement had set and excess cement was removed with a sponge removed from the acetabulum to protect it during cementation on S head and bipolar head where it is attached and the hip reduced with postop x-ray showing good cement mantle and position.  The hip was reduced and was stable the wound was thoroughly irrigated with fibrillar placed along the posterior capsule and medial neck. The deep fascia ws closed using a heavy Quill after infiltration of 30 cc of quarter percent Sensorcaine with epinephrine.3-0 V-loc to close the skin with skin staples.  Incisional wound VAC applied and patient was sent to recovery in stable condition.   PLAN OF CARE: Admit to inpatient

## 2022-01-05 ENCOUNTER — Encounter: Payer: Self-pay | Admitting: Orthopedic Surgery

## 2022-01-05 DIAGNOSIS — J9601 Acute respiratory failure with hypoxia: Secondary | ICD-10-CM | POA: Diagnosis not present

## 2022-01-05 DIAGNOSIS — N179 Acute kidney failure, unspecified: Secondary | ICD-10-CM | POA: Diagnosis not present

## 2022-01-05 DIAGNOSIS — S72002A Fracture of unspecified part of neck of left femur, initial encounter for closed fracture: Secondary | ICD-10-CM | POA: Diagnosis not present

## 2022-01-05 DIAGNOSIS — J449 Chronic obstructive pulmonary disease, unspecified: Secondary | ICD-10-CM | POA: Diagnosis not present

## 2022-01-05 LAB — BASIC METABOLIC PANEL
Anion gap: 7 (ref 5–15)
BUN: 33 mg/dL — ABNORMAL HIGH (ref 8–23)
CO2: 19 mmol/L — ABNORMAL LOW (ref 22–32)
Calcium: 7.9 mg/dL — ABNORMAL LOW (ref 8.9–10.3)
Chloride: 111 mmol/L (ref 98–111)
Creatinine, Ser: 1.92 mg/dL — ABNORMAL HIGH (ref 0.44–1.00)
GFR, Estimated: 25 mL/min — ABNORMAL LOW (ref >60)
Glucose, Bld: 180 mg/dL — ABNORMAL HIGH (ref 70–99)
Potassium: 4.8 mmol/L (ref 3.5–5.1)
Sodium: 137 mmol/L (ref 135–145)

## 2022-01-05 LAB — MAGNESIUM: Magnesium: 2.2 mg/dL (ref 1.7–2.4)

## 2022-01-05 LAB — CBC
HCT: 31.2 % — ABNORMAL LOW (ref 36.0–46.0)
Hemoglobin: 9.5 g/dL — ABNORMAL LOW (ref 12.0–15.0)
MCH: 26.2 pg (ref 26.0–34.0)
MCHC: 30.4 g/dL (ref 30.0–36.0)
MCV: 86.2 fL (ref 80.0–100.0)
Platelets: 290 10*3/uL (ref 150–400)
RBC: 3.62 MIL/uL — ABNORMAL LOW (ref 3.87–5.11)
RDW: 17.4 % — ABNORMAL HIGH (ref 11.5–15.5)
WBC: 12.2 10*3/uL — ABNORMAL HIGH (ref 4.0–10.5)
nRBC: 0 % (ref 0.0–0.2)

## 2022-01-05 MED ORDER — FE FUMARATE-B12-VIT C-FA-IFC PO CAPS
1.0000 | ORAL_CAPSULE | Freq: Two times a day (BID) | ORAL | Status: DC
  Administered 2022-01-07: 1 via ORAL
  Filled 2022-01-05 (×6): qty 1

## 2022-01-05 MED ORDER — OXYCODONE HCL 5 MG PO TABS: 2.5000 mg | ORAL_TABLET | ORAL | 0 refills | Status: AC | PRN

## 2022-01-05 NOTE — TOC Progression Note (Signed)
Transition of Care [facility]) - Progression Note    Patient Details  Name: Olivia Lane MRN: [id number] Date of Birth: Jan 26, 1936  [facility] CM/SW [place], RN Phone Number: 01/05/2022, 4:22 PM  Clinical Narrative:     Spoke with Olivia Mackie, reviewed bed offers, They chose [facility], I notified Seth Bake Will need Ins auth       Expected Discharge Plan and Services                                                 Social Determinants of Health (SDOH) Interventions    Readmission Risk Interventions     No data to display

## 2022-01-05 NOTE — Progress Notes (Signed)
Notified Dr Louanne Belton of bladder scan volume of 300. Received order to in and out cath x1

## 2022-01-05 NOTE — Progress Notes (Signed)
Pt is refusing food and medications. Pt states she is dying. She is ready to go to heaven

## 2022-01-05 NOTE — TOC Progression Note (Signed)
Transition of Care [facility]) - Progression Note    Patient Details  Name: Olivia Lane MRN: [id number] Date of Birth: 1935-08-28  Transition of Care [facility]) CM/SW [place], RN Phone Number: 01/05/2022, 12:58 PM  Clinical Narrative:     Met with the patient in the room, she is agreeable to go to [facility] She lives at home with her son who works, Estate manager/land agent sent, FL2 completed, PASSR obtained       Expected Discharge Plan and Services                                                 Social Determinants of Health (SDOH) Interventions    Readmission Risk Interventions     No data to display

## 2022-01-05 NOTE — Evaluation (Signed)
Physical Therapy Evaluation Patient Details Name: Olivia Lane MRN: [id number] DOB: [birth year] Today's Date: 01/05/2022  History of Present Illness  Pt is a 86 yo F who sustained L closed hip fx due to a fall is now s/p L hemiarthroplasty 01/05/22. PMH includes  GERD, hypertension, COPD CAD, PAD, paroxysmal atrial fibrillation, on Eliquis and dyslipidemia  Clinical Impression  Pt alert but extremely confused and requiring encouragement to participate with therapy. Has difficulty following one step commands and requires max physical/verbal cues to complete tasks. Pt able to complete sit to supine but requiring maxA for trunk control and leg management. Pt complete sit to stand w/ +2 modA to initiate stand and needs physical/verbal cues for hand placement and sequencing. Pt able to sidestep/shuffle to [facility] w/ +2 modA for steadying to prevent posterior LOB and maxA to advance RW. Max physical/verbal cues needed for sequencing throughout. Pt will benefit from PT services in a SNF setting upon discharge to safely address deficits listed in patient problem list for decreased caregiver assistance and eventual return to PLOF.      Recommendations for follow up therapy are one component of a multi-disciplinary discharge planning process, led by the attending physician.  Recommendations may be updated based on patient status, additional functional criteria and insurance authorization.  Follow Up Recommendations Skilled nursing-short term rehab (<3 hours/day) Can patient physically be transported by private vehicle: No    Assistance Recommended at Discharge Frequent or constant Supervision/Assistance  Patient can return home with the following  A lot of help with walking and/or transfers;A lot of help with bathing/dressing/bathroom;Assistance with cooking/housework;Assist for transportation;Help with stairs or ramp for entrance    Equipment Recommendations Rolling walker (2 wheels);BSC/3in1   Recommendations for Other Services       Functional Status Assessment Patient has had a recent decline in their functional status and demonstrates the ability to make significant improvements in function in a reasonable and predictable amount of time.     Precautions / Restrictions Precautions Precautions: Anterior Hip;Fall Restrictions Weight Bearing Restrictions: Yes LLE Weight Bearing: Weight bearing as tolerated      Mobility  Bed Mobility Overal bed mobility: Needs Assistance Bed Mobility: Sit to Supine       Sit to supine: Max assist   General bed mobility comments: maxA for trunk control and leg management    Transfers Overall transfer level: Needs assistance Equipment used: Rolling walker (2 wheels) Transfers: Sit to/from Stand Sit to Stand: Mod assist, +2 physical assistance, From elevated surface           General transfer comment: modA to initiate stand and physical/verbal cues for hand placement    Ambulation/Gait Ambulation/Gait assistance: Mod assist, +2 physical assistance Gait Distance (Feet): 3 Feet Assistive device: Rolling walker (2 wheels) Gait Pattern/deviations: Step-to pattern, Decreased step length - right, Decreased step length - left, Shuffle Gait velocity: decreased     General Gait Details: able to sidestep/shuffle to [facility] following physical/verbal cues for sequencing and RW management; modA for steadying to prevent posterior LOB  Stairs            Wheelchair Mobility    Modified Rankin (Stroke Patients Only)       Balance Overall balance assessment: Needs assistance Sitting-balance support: Feet supported Sitting balance-Leahy Scale: Fair     Standing balance support: Bilateral upper extremity supported, Reliant on assistive device for balance, During functional activity Standing balance-Leahy Scale: Poor  Pertinent Vitals/Pain Pain Assessment Pain Assessment: 0-10 Pain  Score: 2  Pain Location: L Hip Pain Descriptors / Indicators: Aching Pain Intervention(s): Monitored during session, Repositioned    Home Living Family/patient expects to be discharged to:: Private residence Living Arrangements: Children Available Help at Discharge: Family;Available PRN/intermittently Type of Home: House Home Access: Level entry   Entrance Stairs-Number of Steps: brother reports no STE     Home Equipment: None      Prior Function Prior Level of Function : Independent/Modified Independent             Mobility Comments: pt brother reports she amb with no AD, +fall history ADLs Comments: pt brother reports MODI-I in ADL/IADL     Hand Dominance        Extremity/Trunk Assessment   Upper Extremity Assessment Upper Extremity Assessment: Generalized weakness    Lower Extremity Assessment Lower Extremity Assessment: Generalized weakness       Communication   Communication: Other (comment) (pt confused)  Cognition Arousal/Alertness: Awake/alert Behavior During Therapy: Restless, Anxious Overall Cognitive Status: Impaired/Different from baseline                                 General Comments: states throughout session that we are hurting her        General Comments General comments (skin integrity, edema, etc.): BP    Exercises General Exercises - Lower Extremity Ankle Circles/Pumps: Strengthening, Both, 10 reps Quad Sets: Strengthening, Both, 10 reps Long Arc Quad: Strengthening, Both, 10 reps Heel Slides: Strengthening, Both, 10 reps   Assessment/Plan    PT Assessment Patient needs continued PT services  PT Problem List Decreased strength;Decreased mobility;Decreased activity tolerance;Decreased balance;Decreased knowledge of use of DME;Decreased cognition       PT Treatment Interventions DME instruction;Therapeutic exercise;Balance training;Gait training;Stair training;Functional mobility training;Therapeutic  activities;Patient/family education    PT Goals (Current goals can be found in the Care Plan section)  Acute Rehab PT Goals Patient Stated Goal: none stated PT Goal Formulation: With patient Time For Goal Achievement: 01/18/22 Potential to Achieve Goals: Fair    Frequency BID     Co-evaluation               AM-PAC PT "6 Clicks" Mobility  Outcome Measure Help needed turning from your back to your side while in a flat bed without using bedrails?: A Lot Help needed moving from lying on your back to sitting on the side of a flat bed without using bedrails?: A Lot Help needed moving to and from a bed to a chair (including a wheelchair)?: A Lot Help needed standing up from a chair using your arms (e.g., wheelchair or bedside chair)?: A Lot Help needed to walk in hospital room?: A Lot Help needed climbing 3-5 steps with a railing? : Total 6 Click Score: 11    End of Session Equipment Utilized During Treatment: Gait belt Activity Tolerance: Patient tolerated treatment well Patient left: in bed;with call bell/phone within reach;with bed alarm set;with SCD's reapplied;Other (comment) (w/ mittens) Nurse Communication: Mobility status PT Visit Diagnosis: Other abnormalities of gait and mobility (R26.89);Muscle weakness (generalized) (M62.81);Pain;Unsteadiness on feet (R26.81) Pain - Right/Left: Left Pain - part of body: Hip    Time: 4098-1191 PT Time Calculation (min) (ACUTE ONLY): 24 min   Charges:              Turner Daniels, SPT  01/05/2022, 1:22 PM

## 2022-01-05 NOTE — Progress Notes (Signed)
PROGRESS NOTE    Olivia Lane  HYQ:[id number] DOB: 05-25-1936 DOA: 01/02/2022 PCP: Jonetta Osgood, NP   Brief Narrative:   Olivia Lane is a 86 . Caucasian female with past medical history of GERD, hypertension, COPD, CAD, peripheral vascular disease, paroxysmal atrial fibrillation on Eliquis and dyslipidemia presented to the hospital after sustaining a mechanical fall.  Patient is independent at baseline.  In the ED, patient was noted to have a left hip fracture.  Orthopedic surgery was consulted and patient was admitted to hospital for further evaluation and treatment.  During hospitalization, patient was seen by orthopedics and underwent surgical intervention 12/07/2021 with left hemiarthroplasty.  At this time, patient is awaiting for PT evaluation rehabilitation.  Has had some confusion and needed mittens and is on supplemental oxygen.  Assessment & Plan:   Principal Problem:   Closed left hip fracture (HCC) Active Problems:   Acute respiratory failure with hypoxia (HCC)   Chronic obstructive pulmonary disease (COPD) (HCC)   AKI (acute kidney injury) ([place])   Coronary artery disease   PVD (peripheral vascular disease) (HCC)   Leukocytosis   Essential hypertension   Dyslipidemia   Stage 3b chronic kidney disease (CKD) (HCC)   Hyperkalemia  Assessment and Plan:  * Closed left hip fracture (HCC) Status post mechanical fall.  Independent at baseline.  X-ray of the neck with left femoral neck fracture.  CT of the head C-spine negative.  Patient was on Plavix at home which was on hold for surgical intervention.  Status post left hip hemiarthroplasty on 01/04/2022.  Await PT OT evaluation and further recommendations  Acute respiratory failure with hypoxia ([place]) CT chest without contrast showed marked or severity of emphysematous lung disease with ill-defined left upper lobe scar atelectasis and/or infiltrate.  Further evaluation with nuclear medicine PET/CT was  recommended.  Low-attenuation area in the left lower lobe.  Patient had desaturation up to 80s in room air on presentation.  Patient will need to follow-up with pulmonary PCP as outpatient.  Continue with  inhalers nebulizers incentive spirometry .  Will need outpatient follow-up of pulmonary abnormality.  AKI (acute kidney injury) with CKD stage IIIb (HCC)  Baseline around 1.3.  Patient had AKI on presentation with a creatinine of 2.3 yesterday.  Creatinine has slightly trended down to 1.9.  We will continue to monitor.  Continue to monitor.  Renal ultrasound without acute findings.  Currently on normal saline 100 mill per hour.  Has been started on oral diet.  Reassess IV fluid need in AM.  Mild hyperkalemia.  Improved.  Latest potassium of 4.8  Chronic obstructive pulmonary disease (COPD) (HCC) Currently 2 L of oxygen by nasal cannula.  Continue oxygen nebulizers and inhalers.  No overt wheezing at this time.  Wean as able.  Coronary artery disease/Hx CABG Continue metoprolol Lipitor.  Aspirin twice daily, Plavix has been resumed.  Leukocytosis Mild leukocytosis.  WBC at 12.2 today from 13.7.  Trending down.  Likely reactive.  We will continue to monitor.  PVD (peripheral vascular disease) ([place]) Needs follow up with vascular surgery   Essential hypertension Continue Norvasc and Lopressor.  Blood pressure seems to be stable.  Dyslipidemia Continue statins   DVT prophylaxis: SCD  Code Status: full code   Family Communication:   I spoke with the patient's son on 01/04/2022  Disposition: Uncertain at this time PT evaluation pending.  Likely need skilled nursing facility  Status is: Inpatient  Remains inpatient appropriate because: Hip fracture, status post hip  surgery, respiratory failure, PT evaluation pending.   Consultants:  Orthopedics  Procedures:  Left hip hemiarthroplasty on 01/04/2022  Antimicrobials:  Anti-infectives (From admission, onward)    Start     Dose/Rate  Route Frequency Ordered Stop   01/05/22 0200  ceFAZolin (ANCEF) IVPB 1 g/50 mL premix        1 g 100 mL/hr over 30 Minutes Intravenous Every 8 hours 01/04/22 2045 01/05/22 0949   01/04/22 1600  ceFAZolin (ANCEF) IVPB 1 g/50 mL premix        1 g 100 mL/hr over 30 Minutes Intravenous  Once 01/03/22 0642 01/04/22 1801      Subjective: Today, patient was seen and examined at bedside.  Complains of mild hip discomfort.  Denies any nausea vomiting fever or chills.  Was a little confused in the nighttime and had mittens.  Requiring some supplemental oxygen but denies overt shortness of breath or dyspnea  Objective: Vitals:   01/04/22 2018 01/04/22 2325 01/05/22 0349 01/05/22 0832  BP: 94/60 (!) 101/46 132/70 (!) 124/54  Pulse: 70 66 82 74  Resp: '16 17 16 17  '$ Temp: 98.1 F (36.7 C) 97.8 F (36.6 C) 98.2 F (36.8 C) 97.9 F (36.6 C)  TempSrc:    Oral  SpO2:  94% 99% 94%  Weight:      Height:        Intake/Output Summary (Last 24 hours) at 01/05/2022 1137 Last data filed at 01/05/2022 0700 Gross per 24 hour  Intake 983.91 ml  Output 250 ml  Net 733.91 ml    Filed Weights   01/02/22 2215  Weight: 47.3 kg   Body mass index is 19.06 kg/m.   Physical examination: General: Thinly built, not in obvious distress, on nasal cannula oxygen HENT:   No scleral pallor or icterus noted. Oral mucosa is moist.  Chest:  Clear breath sounds.  Diminished breath sounds bilaterally. No crackles or wheezes.  CVS: S1 &S2 heard. No murmur.  Regular rate and rhythm. Abdomen: Soft, nontender, nondistended.  Bowel sounds are heard.   Extremities: Left lower extremity status post surgery, bilateral mittens in place Psych: Alert, awake and communicative, oriented to hospital, slightly confused normal mood CNS:  No cranial nerve deficits.  Moves extremities Skin: Warm and dry.  No rashes noted.   Data Reviewed: I have personally reviewed following labs and imaging studies  CBC: Recent Labs  Lab  01/02/22 2325 01/03/22 0412 01/04/22 0407 01/05/22 0620  WBC 11.1* 16.5* 13.7* 12.2*  NEUTROABS 8.4*  --  10.5*  --   HGB 10.7* 11.2* 10.9* 9.5*  HCT 34.9* 37.7 35.7* 31.2*  MCV 87.7 89.3 88.1 86.2  PLT 297 394 318 290     Basic Metabolic Panel: Recent Labs  Lab 01/03/22 0035 01/03/22 0412 01/04/22 0407 01/04/22 1415 01/05/22 0620  NA 139 141 136  --  137  K 4.5 4.8 5.7* 5.2* 4.8  CL 110 111 107  --  111  CO2 '24 24 22  '$ --  19*  GLUCOSE 125* 131* 106*  --  180*  BUN 17 18 30*  --  33*  CREATININE 1.69* 1.71* 2.31*  --  1.92*  CALCIUM 8.4* 8.7* 8.5*  --  7.9*  MG  --   --  1.8  --  2.2  PHOS  --   --  4.6  --   --      GFR: Estimated Creatinine Clearance: 16.3 mL/min (A) (by C-G formula based on SCr of 1.92  mg/dL (H)).  Liver Function Tests: Recent Labs  Lab 01/03/22 0035 01/04/22 0407  AST 19 19  ALT 12 10  ALKPHOS 96 83  BILITOT 0.5 0.8  PROT 6.8 6.3*  ALBUMIN 3.2* 2.8*     CBG: No results for input(s): "GLUCAP" in the last 168 hours.   Recent Results (from the past 240 hour(s))  Surgical PCR screen     Status: Abnormal   Collection Time: 01/03/22  3:00 PM   Specimen: Nasal Mucosa; Nasal Swab  Result Value Ref Range Status   MRSA, PCR NEGATIVE NEGATIVE Final   Staphylococcus aureus POSITIVE (A) NEGATIVE Final    Comment: (NOTE) The Xpert SA Assay (FDA approved for NASAL specimens in patients 53 years of age and older), is one component of a comprehensive surveillance program. It is not intended to diagnose infection nor to guide or monitor treatment. Performed at [facility], [address] [place], [place] [address]     adiology Studies: DG HIP [facility] OR W/O PELVIS 2-3 VIEWS LEFT  Result Date: 01/04/2022 CLINICAL DATA:  Left hip arthroplasty. EXAM: DG HIP (WITH OR WITHOUT PELVIS) 2-3V LEFT COMPARISON:  Left hip radiographs 01/02/2022 and intraoperative fluoroscopic images 7623 FINDINGS: Sequelae of left hip hemiarthroplasty are  identified. The prosthesis appears well seated within the acetabulum. No acute fracture is identified. Skin staples and vascular stents are again noted including a focally kinked or narrowed appearance of one of the stents in the proximal to mid left thigh. IMPRESSION: Left hip hemiarthroplasty without evidence of acute osseous abnormality. Electronically Signed   By: Logan Bores M.D.   On: 01/04/2022 19:49   DG HIP UNILAT WITH PELVIS 1V LEFT  Result Date: 01/04/2022 CLINICAL DATA:  Known left hip fracture EXAM: DG HIP (WITH OR WITHOUT PELVIS) 1V*L* COMPARISON:  01/02/2022 FLUOROSCOPY TIME:  Radiation Exposure Index (as provided by the fluoroscopic device): 0.53 mGy If the device does not provide the exposure index: Fluoroscopy Time:  6 seconds Number of Acquired Images:  2 FINDINGS: Initial image again demonstrates the left femoral neck fracture. Subsequent image shows a left hip hemiarthroplasty satisfactory position. IMPRESSION: Left hip hemiarthroplasty. Electronically Signed   By: Inez Catalina M.D.   On: 01/04/2022 19:38   DG C-Arm 1-60 Min-No Report  Result Date: 01/04/2022 Fluoroscopy was utilized by the requesting physician.  No radiographic interpretation.   US RENAL  Result Date: 01/04/2022 CLINICAL DATA:  Acute kidney injury. EXAM: RENAL / URINARY TRACT ULTRASOUND COMPLETE COMPARISON:  CT abdomen pelvis dated August 13, 2018. FINDINGS: Right Kidney: Renal measurements: 8.4 x 3.4 x 4.4 cm = volume: 66 mL. Cortical thinning. Echogenicity within normal limits. No mass or hydronephrosis visualized. 1.4 cm simple cyst. No follow-up imaging is recommended. Left Kidney: Renal measurements: 8.6 x 4.2 x 3.4 cm = volume: 63 mL. Cortical thinning. Echogenicity within normal limits. No mass or hydronephrosis visualized. Several simple cysts measuring up to 2.0 cm. No follow-up imaging is recommended. Bladder: Decompressed by Foley catheter. Other: None. IMPRESSION: 1. No acute abnormality. Bilateral renal  atrophy/cortical thinning. Electronically Signed   By: Titus Dubin M.D.   On: 01/04/2022 10:47   CT CHEST WO CONTRAST  Result Date: 01/03/2022 CLINICAL DATA:  Respiratory illness. EXAM: CT CHEST WITHOUT CONTRAST TECHNIQUE: Multidetector CT imaging of the chest was performed following the standard protocol without IV contrast. RADIATION DOSE REDUCTION: This exam was performed according to the departmental dose-optimization program which includes automated exposure control, adjustment of the mA and/or kV  according to patient size and/or use of iterative reconstruction technique. COMPARISON:  None Available. FINDINGS: Cardiovascular: There is marked severity calcification of the thoracic aorta, without evidence of aortic aneurysm. There is mild cardiomegaly with marked severity coronary artery calcification. No pericardial effusion. Mediastinum/Nodes: No enlarged mediastinal or axillary lymph nodes. Thyroid gland, trachea, and esophagus demonstrate no significant findings. Lungs/Pleura: There is marked severity emphysematous lung disease. A mild amount of ill-defined scarring, atelectasis and/or infiltrate is seen along the medial aspect of the left upper lobe. A small, approximately 18 mm x 7 mm area of adjacent pleural based low-attenuation is noted (axial CT image 23, CT series 2). A 2.5 cm x 1.0 cm x 0.9 cm area of low attenuation is seen within the anterior aspect of the left lower lobe,, adjacent to the inferior aspect of the oblique fissure (axial CT image 98, CT series 2/sagittal reformatted image 98, CT series 6). This area is not seen within the visualized portion of the left lung base on the prior abdomen and pelvis CT, dated August 13, 2018. Mild lingular scarring and posterior bibasilar atelectasis is seen. There are small bilateral pleural effusions. No pneumothorax is identified. Upper Abdomen: Bilateral simple renal cysts are seen. Musculoskeletal: Multiple sternal wires are present. A chronic  compression fracture deformity is seen at the level of T10. Multilevel degenerative changes are noted throughout the thoracic spine. IMPRESSION: 1. Marked severity emphysematous lung disease. 2. Mild amount of ill-defined left upper lobe scarring, atelectasis and/or infiltrate, with adjacent area of pleural based low-attenuation. While this may represent focal pleural thickening, sequelae associated with an underlying neoplastic process cannot be excluded. Further evaluation with nuclear medicine PET/CT is recommended. 3. 2.5 cm x 1.0 cm x 0.9 cm area of low attenuation within the left lower lobe, as described above. Evaluation with a nuclear medicine PET/CT is again recommended to exclude the presence of a pulmonary neoplasm. 4. Small bilateral pleural effusions. 5. Bilateral simple renal cysts. 6. Chronic compression fracture deformity at the level of T10. Aortic Atherosclerosis (ICD10-I70.0) and Emphysema (ICD10-J43.9). Electronically Signed   By: Virgina Norfolk M.D.   On: 01/03/2022 20:02   DG Elbow 2 Views Left  Result Date: 01/03/2022 CLINICAL DATA:  Status post fall. EXAM: LEFT ELBOW - 2 VIEW COMPARISON:  None Available. FINDINGS: There is no evidence of fracture, dislocation, or joint effusion. There is no evidence of arthropathy or other focal bone abnormality. Mild to moderate severity posterior soft tissue swelling is seen. IMPRESSION: Posterior soft tissue swelling without evidence of acute osseous abnormality. Electronically Signed   By: Virgina Norfolk M.D.   On: 01/03/2022 19:47     Scheduled Meds:  acetaminophen  1,000 mg Oral Q8H   amLODipine  10 mg Oral Daily   aspirin  81 mg Oral BID   atorvastatin  40 mg Oral QHS   Chlorhexidine Gluconate Cloth  6 each Topical Daily   clopidogrel  75 mg Oral Daily   docusate sodium  100 mg Oral BID   feeding supplement  237 mL Oral BID BM   ferrous [id number] C-folic acid  1 capsule Oral BID PC   magnesium hydroxide  30 mL Oral QHS    metoprolol tartrate  50 mg Oral Q12H   mometasone-formoterol  2 puff Inhalation BID   multivitamin with minerals  1 tablet Oral Daily   pantoprazole  40 mg Oral BID   polyethylene glycol  17 g Oral BID   [START ON 01/07/2022] Vitamin D (Ergocalciferol)  50,000  Units Oral Q7 days   Continuous Infusions:  sodium chloride 100 mL/hr at 01/05/22 0735   lactated ringers 75 mL/hr at 01/04/22 1729   methocarbamol (ROBAXIN) IV       LOS: 2 days    Flora Lipps, MD [facility] 01/05/2022, 11:37 AM

## 2022-01-05 NOTE — Progress Notes (Signed)
In and out cath volume 350cc

## 2022-01-05 NOTE — Progress Notes (Signed)
Pt became agitated this am around 0400. She pulled out her IV and began pulling at foley, 02 and wound vac line and scds.  SCDS removed and pt placed on mittens.

## 2022-01-05 NOTE — Evaluation (Signed)
Occupational Therapy Evaluation Patient Details Name: Olivia Lane MRN: [id number] DOB: Jun 08, 1936 Today's Date: 01/05/2022   History of Present Illness Pt is a 86 yo F s/p L hemiarthroplasty. PMH includes  GERD, hypertension, COPD CAD, PAD, paroxysmal atrial fibrillation, on Eliquis and dyslipidemia   Clinical Impression   Chart reviewed, pt greeted in bed with brother present. Pt is alert to self only. Per brother pt was indep in ADL/IADL with no AD, however ?historian. Pt is unable to provide biographical information due to current level of cognition. MAX A required for bed mobility, STS with MAX A with RW with pt unable to achieve full upright standing. MAX A for UB/LB dressing. Pt left in care of PT seated at edge of bed, all needs met. Pt presents with deficits in strength, activity tolerance, endurance all affecting safe and optimal ADL completion. Recommend discharge to STR to address functional deficits. OT will follow acutely.      Recommendations for follow up therapy are one component of a multi-disciplinary discharge planning process, led by the attending physician.  Recommendations may be updated based on patient status, additional functional criteria and insurance authorization.   Follow Up Recommendations  Skilled nursing-short term rehab (<3 hours/day)    Assistance Recommended at Discharge Frequent or constant Supervision/Assistance  Patient can return home with the following Two people to help with walking and/or transfers;A lot of help with bathing/dressing/bathroom    Functional Status Assessment  Patient has had a recent decline in their functional status and demonstrates the ability to make significant improvements in function in a reasonable and predictable amount of time.  Equipment Recommendations  Other (comment) (per next venue of care)    Recommendations for Other Services       Precautions / Restrictions Precautions Precautions: Anterior  Hip;Fall Restrictions Weight Bearing Restrictions: Yes LLE Weight Bearing: Weight bearing as tolerated      Mobility Bed Mobility Overal bed mobility: Needs Assistance Bed Mobility: Supine to Sit     Supine to sit: Max assist, HOB elevated          Transfers Overall transfer level: Needs assistance Equipment used: Rolling walker (2 wheels) Transfers: Sit to/from Stand Sit to Stand: Max assist, From elevated surface                  Balance Overall balance assessment: Needs assistance Sitting-balance support: Feet supported Sitting balance-Leahy Scale: Fair     Standing balance support: Bilateral upper extremity supported, Reliant on assistive device for balance, During functional activity Standing balance-Leahy Scale: Zero                 High Level Balance Comments: Bp supine 133/51, HR 75; seated at EOB 140/78 HR 69           ADL either performed or assessed with clinical judgement   ADL Overall ADL's : Needs assistance/impaired     Grooming: Wash/dry face;Sitting;Maximal assistance;Cueing for sequencing           Upper Body Dressing : Maximal assistance;Sitting;Cueing for sequencing   Lower Body Dressing: Maximal assistance;Bed level;Cueing for sequencing                       Vision   Additional Comments: pt does not report vision changes however is a poor report at this time. WIll continue to assess.     Perception     Praxis      Pertinent Vitals/Pain Pain Assessment Pain Assessment: Faces  Hand Dominance     Extremity/Trunk Assessment Upper Extremity Assessment Upper Extremity Assessment: Generalized weakness   Lower Extremity Assessment Lower Extremity Assessment: Generalized weakness       Communication Communication Communication: Other (comment) (pt confused)   Cognition Arousal/Alertness: Awake/alert Behavior During Therapy: Restless, Anxious Overall Cognitive Status: Impaired/Different from  baseline Area of Impairment: Orientation, Attention, Memory, Following commands, Safety/judgement, Awareness, Problem solving                 Orientation Level: Disoriented to, Place, Time, Situation Current Attention Level: Focused Memory: Decreased recall of precautions, Decreased short-term memory Following Commands: Follows one step commands inconsistently Safety/Judgement: Decreased awareness of safety, Decreased awareness of deficits Awareness: Intellectual Problem Solving: Slow processing, Decreased initiation, Difficulty sequencing, Requires verbal cues, Requires tactile cues       General Comments  BP    Exercises     Shoulder Instructions      Home Living Family/patient expects to be discharged to:: Private residence Living Arrangements: Children Available Help at Discharge: Family;Available PRN/intermittently Type of Home: House Home Access: Level entry Entrance Stairs-Number of Steps: brother reports no STE                   Home Equipment: None          Prior Functioning/Environment Prior Level of Function : Independent/Modified Independent             Mobility Comments: pt brother reports she amb with no AD, +fall history ADLs Comments: pt brother reports MODI-I in ADL/IADL        OT Problem List: Decreased strength;Impaired balance (sitting and/or standing);Decreased cognition;Decreased knowledge of precautions;Impaired vision/perception;Decreased safety awareness;Decreased activity tolerance;Decreased knowledge of use of DME or AE      OT Treatment/Interventions: Self-care/ADL training;Therapeutic exercise;Patient/family education;Balance training;Energy conservation;Therapeutic activities;DME and/or AE instruction    OT Goals(Current goals can be found in the care plan section) Acute Rehab OT Goals OT Goal Formulation: Patient unable to participate in goal setting  OT Frequency: Min 2X/week    Co-evaluation               AM-PAC OT "6 Clicks" Daily Activity     Outcome Measure Help from another person eating meals?: A Lot Help from another person taking care of personal grooming?: A Lot Help from another person toileting, which includes using toliet, bedpan, or urinal?: A Lot Help from another person bathing (including washing, rinsing, drying)?: A Lot Help from another person to put on and taking off regular upper body clothing?: A Lot Help from another person to put on and taking off regular lower body clothing?: A Lot 6 Click Score: 12   End of Session Equipment Utilized During Treatment: Gait belt;Rolling walker (2 wheels) Nurse Communication: Mobility status  Activity Tolerance: Patient tolerated treatment well Patient left: Other (comment) (at edge of bed with PT present)  OT Visit Diagnosis: Muscle weakness (generalized) (M62.81);History of falling (Z91.81);Other abnormalities of gait and mobility (R26.89)                Time: 8727-6184 OT Time Calculation (min): 22 min Charges:  OT General Charges $OT Visit: 1 Visit OT Evaluation $OT Eval Moderate Complexity: 1 Mod  Shanon Payor, OTD OTR/L  01/05/22, 12:50 PM

## 2022-01-05 NOTE — Progress Notes (Signed)
Physical Therapy Treatment Patient Details Name: Olivia Lane MRN: [id number] DOB: 17-Dec-1935 Today's Date: 01/05/2022   History of Present Illness Pt is a 86 yo F who sustained L closed hip fx due to a fall is now s/p L hemiarthroplasty 01/05/22. PMH includes  GERD, hypertension, COPD CAD, PAD, paroxysmal atrial fibrillation, on Eliquis and dyslipidemia    PT Comments    Pt alert but extremely confused and requiring encouragement to participate with therapy. Has difficulty following one step commands and requires max physical/verbal cues to complete tasks. Pt completed bed mobility but w/ maxA for trunk control and leg management and heavy cuing throughout. Multiple attempts made to stand from elevated surface with one successful stand in which pt was able to sustain for at least 5mn; maxA needed to initiate and for steadying to prevent posterior LOB and with heavy cuing throughout for sequencing and hand placement. Pt demonstrated no righting reaction w/ posterior LOB and needing maxA to correct. [facility]. Pt will benefit from PT services in a SNF setting upon discharge to safely address deficits listed in patient problem list for decreased caregiver assistance and eventual return to PLOF.   Recommendations for follow up therapy are one component of a multi-disciplinary discharge planning process, led by the attending physician.  Recommendations may be updated based on patient status, additional functional criteria and insurance authorization.  Follow Up Recommendations  Skilled nursing-short term rehab (<3 hours/day) Can patient physically be transported by private vehicle: No   Assistance Recommended at Discharge Frequent or constant Supervision/Assistance  Patient can return home with the following A lot of help with walking and/or transfers;A lot of help with bathing/dressing/bathroom;Assistance with cooking/housework;Assist  for transportation;Help with stairs or ramp for entrance   Equipment Recommendations  Rolling walker (2 wheels);BSC/3in1    Recommendations for Other Services       Precautions / Restrictions Precautions Precautions: Anterior Hip;Fall Restrictions Weight Bearing Restrictions: Yes LLE Weight Bearing: Weight bearing as tolerated     Mobility  Bed Mobility Overal bed mobility: Needs Assistance Bed Mobility: Supine to Sit, Sit to Supine     Supine to sit: Max assist Sit to supine: Max assist   General bed mobility comments: maxA for trunk control and leg management    Transfers Overall transfer level: Needs assistance Equipment used: Rolling walker (2 wheels) Transfers: Sit to/from Stand Sit to Stand: Max assist, From elevated surface           General transfer comment: multiple attempts made; able to complete 1 stand w/ maxA to initiate and for steadying    Ambulation/Gait      General Gait Details: attempted sideste/shuffle but pt unable to advance LEs   Stairs             Wheelchair Mobility    Modified Rankin (Stroke Patients Only)       Balance Overall balance assessment: Needs assistance Sitting-balance support: Feet supported Sitting balance-Leahy Scale: Fair     Standing balance support: Bilateral upper extremity supported, Reliant on assistive device for balance, During functional activity Standing balance-Leahy Scale: Poor Standing balance comment: no righting reaction; unable to correct posterior LOB                            Cognition Arousal/Alertness: Awake/alert Behavior During Therapy: Restless, Anxious Overall Cognitive Status: Impaired/Different from baseline  General Comments: states throughout session that we are hurting her        Exercises General Exercises - Lower Extremity Ankle Circles/Pumps: Strengthening, Both, 10 reps Long Arc Quad: Strengthening,  Both, 10 reps Heel Slides: Strengthening, Both, 10 reps Other Exercises Other Exercises: standing weightshifts L/R x10    General Comments General comments (skin integrity, edema, etc.):       Pertinent Vitals/Pain Pain Assessment Pain Assessment: No/denies pain Pain Score: 2  Pain Location: L Hip Pain Descriptors / Indicators: Aching Pain Intervention(s): Monitored during session, Repositioned    Home Living Family/patient expects to be discharged to:: Private residence Living Arrangements: Children Available Help at Discharge: Family;Available PRN/intermittently Type of Home: House Home Access: Level entry   Entrance Stairs-Number of Steps: brother reports no STE     Home Equipment: None      Prior Function            PT Goals (current goals can now be found in the care plan section) Acute Rehab PT Goals Patient Stated Goal: none stated PT Goal Formulation: With patient Time For Goal Achievement: 01/18/22 Potential to Achieve Goals: Fair Progress towards PT goals: Progressing toward goals    Frequency    BID      PT Plan Current plan remains appropriate    Co-evaluation              AM-PAC PT "6 Clicks" Mobility   Outcome Measure  Help needed turning from your back to your side while in a flat bed without using bedrails?: A Lot Help needed moving from lying on your back to sitting on the side of a flat bed without using bedrails?: A Lot Help needed moving to and from a bed to a chair (including a wheelchair)?: A Lot Help needed standing up from a chair using your arms (e.g., wheelchair or bedside chair)?: A Lot Help needed to walk in hospital room?: A Lot Help needed climbing 3-5 steps with a railing? : Total 6 Click Score: 11    End of Session Equipment Utilized During Treatment: Gait belt Activity Tolerance: Patient tolerated treatment well Patient left: in bed;with call bell/phone within reach;with bed alarm set;with SCD's reapplied;with  nursing/sitter in room Nurse Communication: Mobility status PT Visit Diagnosis: Other abnormalities of gait and mobility (R26.89);Muscle weakness (generalized) (M62.81);Pain;Unsteadiness on feet (R26.81) Pain - Right/Left: Left Pain - part of body: Hip     Time: 9528-4132 PT Time Calculation (min) (ACUTE ONLY): 28 min  Charges:                     Turner Daniels, SPT  01/05/2022, 4:07 PM

## 2022-01-05 NOTE — Progress Notes (Signed)
  Subjective: 1 Day Post-Op Procedure(s) (LRB): ARTHROPLASTY BIPOLAR HIP (HEMIARTHROPLASTY) (Left) Patient reports pain as moderate.   Patient is well, and has had no acute complaints or problems Plan is to go Rehab after hospital stay. Negative for chest pain and shortness of breath Fever: no Gastrointestinal: Negative for nausea and vomiting  Objective: Vital signs in last 24 hours: Temp:  [97.8 F (36.6 C)-99 F (37.2 C)] 98.2 F (36.8 C) (07/07 0349) Pulse Rate:  [64-82] 82 (07/07 0349) Resp:  [15-18] 16 (07/07 0349) BP: (92-132)/(42-70) 132/70 (07/07 0349) SpO2:  [87 %-99 %] 99 % (07/07 0349)  Intake/Output from previous day:  Intake/Output Summary (Last 24 hours) at 01/05/2022 0614 Last data filed at 01/05/2022 0521 Gross per 24 hour  Intake 826.51 ml  Output 50 ml  Net 776.51 ml    Intake/Output this shift: Total I/O In: 826.5 [P.O.:30; I.V.:696.5; IV Piggyback:100] Out: -   Labs: Recent Labs    01/02/22 2325 01/03/22 0412 01/04/22 0407  HGB 10.7* 11.2* 10.9*   Recent Labs    01/03/22 0412 01/04/22 0407  WBC 16.5* 13.7*  RBC 4.22 4.05  HCT 37.7 35.7*  PLT 394 318   Recent Labs    01/03/22 0412 01/04/22 0407 01/04/22 1415  NA 141 136  --   K 4.8 5.7* 5.2*  CL 111 107  --   CO2 24 22  --   BUN 18 30*  --   CREATININE 1.71* 2.31*  --   GLUCOSE 131* 106*  --   CALCIUM 8.7* 8.5*  --    No results for input(s): "LABPT", "INR" in the last 72 hours.   EXAM General - Patient is Alert and Confused Extremity - Sensation intact distally Dorsiflexion/Plantar flexion intact Compartment soft Dressing/Incision - clean, dry, with the wound VAC in place Motor Function - intact, moving foot and toes well on exam.   Past Medical History:  Diagnosis Date   Anemia    Gastro-esophageal reflux    Hyperlipidemia    Hypertension     Assessment/Plan: 1 Day Post-Op Procedure(s) (LRB): ARTHROPLASTY BIPOLAR HIP (HEMIARTHROPLASTY) (Left) Principal  Problem:   Closed left hip fracture (HCC) Active Problems:   Coronary artery disease   Essential hypertension   PVD (peripheral vascular disease) (HCC)   Chronic obstructive pulmonary disease (COPD) (HCC)   Dyslipidemia   Acute respiratory failure with hypoxia (HCC)   Leukocytosis   AKI (acute kidney injury) ([place])   Stage 3b chronic kidney disease (CKD) (HCC)   Hyperkalemia  Estimated body mass index is 19.06 kg/m as calculated from the following:   Height as of this encounter: '5\' 2"'$  (1.575 m).   Weight as of this encounter: 47.3 kg. Advance diet Up with physical therapy  Discharge planning.  Follow-up at [facility] orthopedics in 2 weeks for staple removal.  DVT Prophylaxis - Foot Pumps, TED hose, and Plavix Weight-Bearing as tolerated to left leg  Reche Dixon, PA-C Orthopaedic Surgery 01/05/2022, 6:14 AM

## 2022-01-05 NOTE — Discharge Instructions (Signed)
ANTERIOR APPROACH TOTAL HIP REPLACEMENT POSTOPERATIVE DIRECTIONS   Hip Rehabilitation, Guidelines Following Surgery  The results of a hip operation are greatly improved after range of motion and muscle strengthening exercises. Follow all safety measures which are given to protect your hip. If any of these exercises cause increased pain or swelling in your joint, decrease the amount until you are comfortable again. Then slowly increase the exercises. Call your caregiver if you have problems or questions.   HOME CARE INSTRUCTIONS  Remove items at home which could result in a fall. This includes throw rugs or furniture in walking pathways.  ICE to the affected hip every three hours for 30 minutes at a time and then as needed for pain and swelling.  Continue to use ice on the hip for pain and swelling from surgery. You may notice swelling that will progress down to the foot and ankle.  This is normal after surgery.  Elevate the leg when you are not up walking on it.   Continue to use the breathing machine which will help keep your temperature down.  It is common for your temperature to cycle up and down following surgery, especially at night when you are not up moving around and exerting yourself.  The breathing machine keeps your lungs expanded and your temperature down. Do not place pillow under knee, focus on keeping the knee straight while resting  DIET You may resume your previous home diet once your are discharged from the hospital.  DRESSING / WOUND CARE / SHOWERING The wound VAC can be removed in 5 to 6 days after discharge.  The alarm will begin to sound and it can be turned off.  The wound VAC can then be removed and changed to a dry dressing. Keep your dressing dry with showering.  You can keep it covered and pat dry. Change the surgical dressing daily and reapply a dry dressing each time.  ACTIVITY Walk with your walker as instructed. Use walker as long as suggested by your  caregivers. Avoid periods of inactivity such as sitting longer than an hour when not asleep. This helps prevent blood clots.  You may resume a sexual relationship in one month or when given the OK by your doctor.  You may return to work once you are cleared by your doctor.  Do not drive a car for 6 weeks or until released by you surgeon.  Do not drive while taking narcotics.  WEIGHT BEARING Weight bearing as tolerated with assist device (walker, cane, etc) as directed, use it as long as suggested by your surgeon or therapist, typically at least 4-6 weeks.  POSTOPERATIVE CONSTIPATION PROTOCOL Constipation - defined medically as fewer than three stools per week and severe constipation as less than one stool per week.  One of the most common issues patients have following surgery is constipation.  Even if you have a regular bowel pattern at home, your normal regimen is likely to be disrupted due to multiple reasons following surgery.  Combination of anesthesia, postoperative narcotics, change in appetite and fluid intake all can affect your bowels.  In order to avoid complications following surgery, here are some recommendations in order to help you during your recovery period.  Colace (docusate) - Pick up an over-the-counter form of Colace or another stool softener and take twice a day as long as you are requiring postoperative pain medications.  Take with a full glass of water daily.  If you experience loose stools or diarrhea, hold the colace  until you stool forms back up.  If your symptoms do not get better within 1 week or if they get worse, check with your doctor.  Dulcolax (bisacodyl) - Pick up over-the-counter and take as directed by the product packaging as needed to assist with the movement of your bowels.  Take with a full glass of water.  Use this product as needed if not relieved by Colace only.   MiraLax (polyethylene glycol) - Pick up over-the-counter to have on hand.  MiraLax is a  solution that will increase the amount of water in your bowels to assist with bowel movements.  Take as directed and can mix with a glass of water, juice, soda, coffee, or tea.  Take if you go more than two days without a movement. Do not use MiraLax more than once per day. Call your doctor if you are still constipated or irregular after using this medication for 7 days in a row.  If you continue to have problems with postoperative constipation, please contact the office for further assistance and recommendations.  If you experience "the worst abdominal pain ever" or develop nausea or vomiting, please contact the office immediatly for further recommendations for treatment.  ITCHING  If you experience itching with your medications, try taking only a single pain pill, or even half a pain pill at a time.  You can also use Benadryl over the counter for itching or also to help with sleep.   TED HOSE STOCKINGS Wear the elastic stockings on both legs for three weeks following surgery during the day but you may remove then at night for sleeping.  MEDICATIONS See your medication summary on the "After Visit Summary" that the nursing staff will review with you prior to discharge.  You may have some home medications which will be placed on hold until you complete the course of blood thinner medication.  It is important for you to complete the blood thinner medication as prescribed by your surgeon.  Continue your approved medications as instructed at time of discharge.  PRECAUTIONS If you experience chest pain or shortness of breath - call 911 immediately for transfer to the hospital emergency department.  If you develop a fever greater that 101 F, purulent drainage from wound, increased redness or drainage from wound, foul odor from the wound/dressing, or calf pain - CONTACT YOUR SURGEON.                                                   FOLLOW-UP APPOINTMENTS Make sure you keep all of your appointments after  your operation with your surgeon and caregivers. You should call the office at the above phone number and make an appointment for approximately two weeks after the date of your surgery or on the date instructed by your surgeon outlined in the "After Visit Summary".  RANGE OF MOTION AND STRENGTHENING EXERCISES  These exercises are designed to help you keep full movement of your hip joint. Follow your caregiver's or physical therapist's instructions. Perform all exercises about fifteen times, three times per day or as directed. Exercise both hips, even if you have had only one joint replacement. These exercises can be done on a training (exercise) mat, on the floor, on a table or on a bed. Use whatever works the best and is most comfortable for you. Use music or television  while you are exercising so that the exercises are a pleasant break in your day. This will make your life better with the exercises acting as a break in routine you can look forward to.  Lying on your back, slowly slide your foot toward your buttocks, raising your knee up off the floor. Then slowly slide your foot back down until your leg is straight again.  Lying on your back spread your legs as far apart as you can without causing discomfort.  Lying on your side, raise your upper leg and foot straight up from the floor as far as is comfortable. Slowly lower the leg and repeat.  Lying on your back, tighten up the muscle in the front of your thigh (quadriceps muscles). You can do this by keeping your leg straight and trying to raise your heel off the floor. This helps strengthen the largest muscle supporting your knee.  Lying on your back, tighten up the muscles of your buttocks both with the legs straight and with the knee bent at a comfortable angle while keeping your heel on the floor.   IF YOU ARE TRANSFERRED TO A SKILLED REHAB FACILITY If the patient is transferred to a skilled rehab facility following release from the hospital, a  list of the current medications will be sent to the facility for the patient to continue.  When discharged from the skilled rehab facility, please have the facility set up the patient's [place] prior to being released. Also, the skilled facility will be responsible for providing the patient with their medications at time of release from the facility to include their pain medication, the muscle relaxants, and their blood thinner medication. If the patient is still at the rehab facility at time of the two week follow up appointment, the skilled rehab facility will also need to assist the patient in arranging follow up appointment in our office and any transportation needs.  MAKE SURE YOU:  Understand these instructions.  Get help right away if you are not doing well or get worse.    Pick up stool softner and laxative for home use following surgery while on pain medications. Do not submerge incision under water. Please use good hand washing techniques while changing dressing each day. May shower starting three days after surgery. Please use a clean towel to pat the incision dry following showers. Continue to use ice for pain and swelling after surgery. Do not use any lotions or creams on the incision until instructed by your surgeon.

## 2022-01-05 NOTE — Care Management Important Message (Signed)
Important Message  Patient Details  Name: Olivia Lane MRN: [id number] Date of Birth: 1935/11/24   Medicare Important Message Given:  Yes     Juliann Pulse A Harlo Fabela 01/05/2022, 2:11 PM

## 2022-01-05 NOTE — NC FL2 (Signed)
[place] LEVEL OF CARE SCREENING TOOL     IDENTIFICATION  Patient Name: Olivia Lane [facility] Birthdate: [birth year] Sex: female Admission Date (Current Location): 01/02/2022  [facility] and [place] Number:  Engineering geologist and Address:  [facility], [address], [place], [place] [address]      Provider Number: [id number]  Attending Physician Name and Address:  Flora Lipps, MD  Relative Name and Phone Number:  Elberta Fortis [phone]    Current Level of Care: Hospital Recommended Level of Care: [place] Prior Approval Number:    Date Approved/Denied:   PASRR Number: [id number]  Discharge Plan: SNF    Current Diagnoses: Patient Active Problem List   Diagnosis Date Noted   Hyperkalemia 01/04/2022   Closed left hip fracture ([place]) 01/03/2022   Dyslipidemia 01/03/2022   Acute respiratory failure with hypoxia ([place]) 01/03/2022   Leukocytosis 01/03/2022   AKI (acute kidney injury) ([place]) 01/03/2022   Stage 3b chronic kidney disease (CKD) ([place]) 01/03/2022   Cutaneous candidiasis 06/19/2019   Encounter for general adult medical examination with abnormal findings 03/24/2019   Atopic dermatitis 03/24/2019   Rash and nonspecific skin eruption 03/24/2019   Dysuria 03/24/2019   Sepsis ([place]) 08/13/2018   Screening for breast cancer 03/18/2018   Thrombocytosis 12/08/2017   Chronic obstructive pulmonary disease (COPD) ([place]) 07/11/2017   Gastro-esophageal reflux disease with esophagitis 07/11/2017   Mixed hyperlipidemia 07/11/2017   Coronary artery disease 08/27/2016   Nausea 12/02/2013   Postoperative atrial fibrillation ([place]) 06/07/2013   S/P CABG x 4 06/03/2013   Abnormal stress electrocardiogram test 05/19/2013   Anemia 05/19/2013   Former smoker 05/19/2013   Essential hypertension 05/19/2013   PVD (peripheral vascular disease) ([place]) 05/19/2013   S/P appendectomy 05/19/2013    Orientation RESPIRATION BLADDER Height &  Weight     Self, Time, Situation, Place  Normal, O2 (2liter) Continent, External catheter Weight: 47.3 kg Height:  '5\' 2"'$  (157.5 cm)  BEHAVIORAL SYMPTOMS/MOOD NEUROLOGICAL BOWEL NUTRITION STATUS      Continent Diet (see dc summary)  AMBULATORY STATUS COMMUNICATION OF NEEDS Skin   Extensive Assist Verbally Normal, Surgical wounds                       Personal Care Assistance Level of Assistance  Bathing, Feeding, Dressing Bathing Assistance: Maximum assistance Feeding assistance: Limited assistance Dressing Assistance: Maximum assistance     Functional Limitations Info             SPECIAL CARE FACTORS FREQUENCY  PT (By licensed PT), OT (By licensed OT)     PT Frequency: 5 times per week OT Frequency: 5 times per week            Contractures Contractures Info: Not present    Additional Factors Info  Code Status, Allergies Code Status Info: full code Allergies Info: NKDA           Current Medications (01/05/2022):  This is the current hospital active medication list Current Facility-Administered Medications  Medication Dose Route Frequency Provider Last Rate Last Admin   0.9 %  sodium chloride infusion   Intravenous Continuous Hessie Knows, MD 100 mL/hr at 01/05/22 0735 New Bag at 01/05/22 0735   acetaminophen (TYLENOL) tablet 1,000 mg  1,000 mg Oral Q8H Hessie Knows, MD   1,000 mg at 01/05/22 5366   Followed by   Derrill Memo ON 01/06/2022] acetaminophen (TYLENOL) tablet 650 mg  650 mg Oral Q6H PRN Hessie Knows, MD  albuterol (PROVENTIL) (2.5 MG/3ML) 0.083% nebulizer solution 2.5 mg  2.5 mg Inhalation Q6H PRN Hessie Knows, MD       alum & mag hydroxide-simeth (MAALOX/MYLANTA) 200-200-20 MG/5ML suspension 30 mL  30 mL Oral Q4H PRN Hessie Knows, MD       amLODipine (NORVASC) tablet 10 mg  10 mg Oral Daily Hessie Knows, MD   10 mg at 01/05/22 0919   aspirin chewable tablet 81 mg  81 mg Oral BID Hessie Knows, MD   81 mg at 01/05/22 0919   atorvastatin  (LIPITOR) tablet 40 mg  40 mg Oral QHS Hessie Knows, MD   40 mg at 01/04/22 2149   bisacodyl (DULCOLAX) suppository 10 mg  10 mg Rectal Daily PRN Hessie Knows, MD       Chlorhexidine Gluconate Cloth 2 % PADS 6 each  6 each Topical Daily Hessie Knows, MD   6 each at 01/04/22 1218   clopidogrel (PLAVIX) tablet 75 mg  75 mg Oral Daily Hessie Knows, MD   75 mg at 01/05/22 0919   diphenhydrAMINE (BENADRYL) 12.5 MG/5ML elixir 12.5-25 mg  12.5-25 mg Oral Q4H PRN Hessie Knows, MD       docusate sodium (COLACE) capsule 100 mg  100 mg Oral BID Hessie Knows, MD   100 mg at 01/05/22 0919   feeding supplement (ENSURE ENLIVE / ENSURE PLUS) liquid 237 mL  237 mL Oral BID BM Hessie Knows, MD   237 mL at 01/03/22 1358   ferrous [id number] C-folic acid (TRINSICON / FOLTRIN) capsule 1 capsule  1 capsule Oral BID PC Hessie Knows, MD       lactated ringers infusion   Intravenous Continuous Hessie Knows, MD 75 mL/hr at 01/04/22 1729 Restarted at 01/04/22 1730   magnesium hydroxide (MILK OF MAGNESIA) suspension 30 mL  30 mL Oral Daily PRN Hessie Knows, MD       magnesium hydroxide (MILK OF MAGNESIA) suspension 30 mL  30 mL Oral QHS Hessie Knows, MD   30 mL at 01/04/22 2149   menthol-cetylpyridinium (CEPACOL) lozenge 3 mg  1 lozenge Oral PRN Hessie Knows, MD       Or   phenol (CHLORASEPTIC) mouth spray 1 spray  1 spray Mouth/Throat PRN Hessie Knows, MD       methocarbamol (ROBAXIN) tablet 500 mg  500 mg Oral Q6H PRN Hessie Knows, MD   500 mg at 01/05/22 0919   Or   methocarbamol (ROBAXIN) 500 mg in dextrose 5 % 50 mL IVPB  500 mg Intravenous Q6H PRN Hessie Knows, MD       metoprolol tartrate (LOPRESSOR) tablet 50 mg  50 mg Oral Q12H Hessie Knows, MD   50 mg at 01/05/22 0919   mometasone-formoterol (DULERA) 200-5 MCG/ACT inhaler 2 puff  2 puff Inhalation BID Hessie Knows, MD       morphine (PF) 2 MG/ML injection 0.5 mg  0.5 mg Intravenous Q2H PRN Hessie Knows, MD   0.5 mg at 01/03/22 0420    multivitamin with minerals tablet 1 tablet  1 tablet Oral Daily Hessie Knows, MD   1 tablet at 01/05/22 0919   nitroGLYCERIN (NITROSTAT) SL tablet 0.4 mg  0.4 mg Sublingual Q5 min PRN Hessie Knows, MD       ondansetron [facility]) injection 4 mg  4 mg Intravenous Q4H PRN Hessie Knows, MD   4 mg at 01/04/22 1822   ondansetron (ZOFRAN) tablet 4 mg  4 mg Oral Q6H PRN Hessie Knows, MD  Or   ondansetron (ZOFRAN) injection 4 mg  4 mg Intravenous Q6H PRN Hessie Knows, MD       oxyCODONE (Oxy IR/ROXICODONE) immediate release tablet 2.5 mg  2.5 mg Oral Q4H PRN Hessie Knows, MD       Or   oxyCODONE (Oxy IR/ROXICODONE) immediate release tablet 5 mg  5 mg Oral Q4H PRN Hessie Knows, MD   5 mg at 01/03/22 2044   pantoprazole (PROTONIX) EC tablet 40 mg  40 mg Oral BID Hessie Knows, MD   40 mg at 01/05/22 0919   polyethylene glycol (MIRALAX / GLYCOLAX) packet 17 g  17 g Oral BID Hessie Knows, MD   17 g at 01/04/22 2151   promethazine (PHENERGAN) tablet 12.5 mg  12.5 mg Oral Q6H PRN Hessie Knows, MD       sodium phosphate (FLEET) 7-19 GM/118ML enema 1 enema  1 enema Rectal Once PRN Hessie Knows, MD       traZODone (DESYREL) tablet 25 mg  25 mg Oral QHS PRN Hessie Knows, MD   25 mg at 01/04/22 2150   [START ON 01/07/2022] Vitamin D (Ergocalciferol) (DRISDOL) capsule 50,000 Units  50,000 Units Oral Q7 days Hessie Knows, MD         Discharge Medications: Please see discharge summary for a list of discharge medications.  Relevant Imaging Results:  Relevant Lab Results:   Additional Information [id number]  Conception Oms, RN

## 2022-01-06 DIAGNOSIS — S72002A Fracture of unspecified part of neck of left femur, initial encounter for closed fracture: Secondary | ICD-10-CM | POA: Diagnosis not present

## 2022-01-06 LAB — BASIC METABOLIC PANEL
Anion gap: 5 (ref 5–15)
BUN: 27 mg/dL — ABNORMAL HIGH (ref 8–23)
CO2: 19 mmol/L — ABNORMAL LOW (ref 22–32)
Calcium: 8.1 mg/dL — ABNORMAL LOW (ref 8.9–10.3)
Chloride: 115 mmol/L — ABNORMAL HIGH (ref 98–111)
Creatinine, Ser: 1.39 mg/dL — ABNORMAL HIGH (ref 0.44–1.00)
GFR, Estimated: 37 mL/min — ABNORMAL LOW (ref >60)
Glucose, Bld: 111 mg/dL — ABNORMAL HIGH (ref 70–99)
Potassium: 5.2 mmol/L — ABNORMAL HIGH (ref 3.5–5.1)
Sodium: 139 mmol/L (ref 135–145)

## 2022-01-06 LAB — CBC
HCT: 30 % — ABNORMAL LOW (ref 36.0–46.0)
Hemoglobin: 9.4 g/dL — ABNORMAL LOW (ref 12.0–15.0)
MCH: 27.3 pg (ref 26.0–34.0)
MCHC: 31.3 g/dL (ref 30.0–36.0)
MCV: 87.2 fL (ref 80.0–100.0)
Platelets: 317 10*3/uL (ref 150–400)
RBC: 3.44 MIL/uL — ABNORMAL LOW (ref 3.87–5.11)
RDW: 18.1 % — ABNORMAL HIGH (ref 11.5–15.5)
WBC: 10.2 10*3/uL (ref 4.0–10.5)
nRBC: 0 % (ref 0.0–0.2)

## 2022-01-06 LAB — MAGNESIUM: Magnesium: 2.4 mg/dL (ref 1.7–2.4)

## 2022-01-06 MED ORDER — ENOXAPARIN SODIUM 30 MG/0.3ML IJ SOSY
30.0000 mg | PREFILLED_SYRINGE | INTRAMUSCULAR | Status: DC
  Administered 2022-01-06: 30 mg via SUBCUTANEOUS
  Filled 2022-01-06: qty 0.3

## 2022-01-06 MED ORDER — SODIUM CHLORIDE 0.9 % IV SOLN: INTRAVENOUS | Status: DC

## 2022-01-06 MED ORDER — ENOXAPARIN SODIUM 40 MG/0.4ML IJ SOSY: 40.0000 mg | PREFILLED_SYRINGE | INTRAMUSCULAR | Status: DC

## 2022-01-06 MED ORDER — SODIUM ZIRCONIUM CYCLOSILICATE 10 G PO PACK
10.0000 g | PACK | Freq: Once | ORAL | Status: AC
  Administered 2022-01-06: 10 g via ORAL
  Filled 2022-01-06: qty 1

## 2022-01-06 MED ORDER — SODIUM POLYSTYRENE SULFONATE 15 GM/60ML PO SUSP: 15.0000 g | Freq: Once | ORAL | Status: DC

## 2022-01-06 NOTE — Progress Notes (Signed)
PT Cancellation Note  Patient Details Name: Olivia Lane MRN: [id number] DOB: [birth year]   Cancelled Treatment:     PT attempt. PT hold. RN staff in room encouraging pt drink and eat. Very minimal intake today. Will hold afternoon session and return tomorrow.    Willette Pa 01/06/2022, 3:14 PM

## 2022-01-06 NOTE — Progress Notes (Addendum)
PROGRESS NOTE    Sienna Stonehocker Larcom  BDZ:[id number] DOB: January 09, 1936 DOA: 01/02/2022 PCP: Jonetta Osgood, NP   Brief Narrative:   Gaylia Kassel is a 86 y.o. Caucasian female with past medical history of GERD, hypertension, COPD, CAD, peripheral vascular disease, paroxysmal atrial fibrillation on Eliquis and dyslipidemia presented to the hospital after sustaining a mechanical fall.  Patient is independent at baseline.  In the ED, patient was noted to have a left hip fracture.  Orthopedic surgery was consulted and patient was admitted to hospital for further evaluation and treatment.  During hospitalization, patient was seen by orthopedics and underwent surgical intervention 12/07/2021 with left hemiarthroplasty.  At this time, patient is awaiting for PT evaluation rehabilitation.  Has had some confusion and needed mittens and is on supplemental oxygen.  Assessment & Plan:   Principal Problem:   Closed left hip fracture (HCC) Active Problems:   Acute respiratory failure with hypoxia (HCC)   Chronic obstructive pulmonary disease (COPD) (HCC)   AKI (acute kidney injury) ([place])   Coronary artery disease   PVD (peripheral vascular disease) (HCC)   Leukocytosis   Essential hypertension   Dyslipidemia   Stage 3b chronic kidney disease (CKD) (HCC)   Hyperkalemia  Assessment and Plan:  * Closed left hip fracture (HCC) S/p left hip hemiarthroplasty on 01/04/2022 with Dr. Rudene Christians --2/2 mechanical fall.  Independent at baseline.  X-ray of the neck with left femoral neck fracture.  CT of the head C-spine negative.   Plan: --PT/OT and SNF rehab --Weight-Bearing as tolerated to left leg --Follow-up at [facility] clinic orthopedics in 2 weeks for staple removal.  Hospital delirium --pt has been confused and refusing to eat/drink, stating that she is going to heaven soon.  At baseline, pt reportedly drives. --ensure pt sleeps at night.  Can give seroquel PRN if pt is willing to take  pills.  Acute respiratory failure with hypoxia (HCC) Chronic obstructive pulmonary disease (COPD) ([place]) Patient had desaturation up to 80s in room air on presentation, was placed on 2L.  CT chest without contrast showed marked or severity of emphysematous lung disease with ill-defined left upper lobe scar atelectasis and/or infiltrate.   --Patient will need to follow-up with pulmonary PCP as outpatient.   --Continue with  inhalers nebulizers incentive spirometry   AKI (acute kidney injury) with CKD stage IIIb (HCC)  Baseline around 1.3.  Patient had AKI on presentation with a creatinine of 2.3.   --Renal ultrasound without acute findings.  Plan: --cont NS'@100'$  for 10 hours due to pt refusing to have oral intake currently  Mild hyperkalemia.   --lokelma 10 g x1 today  Coronary artery disease Hx CABG --cont asa and plavix  Leukocytosis, mild, resolved Likely reactive.    PVD (peripheral vascular disease) (HCC) --cont asa and plavix  Essential hypertension Continue Norvasc and Lopressor.    Dyslipidemia Continue statins  Possible urinary retention --Pt had I/O x1 for 300 ml from bladder scan --I/O cath for >400 ml on bladder scan after pt tried to void   DVT prophylaxis: lovenox  Code Status: full code   Family Communication:   Sister updated at bedside today  Disposition: SNF  Status is: Inpatient    Consultants:  Orthopedics  Procedures:  Left hip hemiarthroplasty on 01/04/2022  Antimicrobials:  Anti-infectives (From admission, onward)    Start     Dose/Rate Route Frequency Ordered Stop   01/05/22 0200  ceFAZolin (ANCEF) IVPB 1 g/50 mL premix  1 g 100 mL/hr over 30 Minutes Intravenous Every 8 hours 01/04/22 2045 01/05/22 0949   01/04/22 1600  ceFAZolin (ANCEF) IVPB 1 g/50 mL premix        1 g 100 mL/hr over 30 Minutes Intravenous  Once 01/03/22 0642 01/04/22 1801      Subjective: Today, patient was seen and examined at bedside.  Complains of mild  hip discomfort.  Denies any nausea vomiting fever or chills.  Was a little confused in the nighttime and had mittens.  Requiring some supplemental oxygen but denies overt shortness of breath or dyspnea  Objective: Vitals:   01/05/22 2048 01/06/22 0220 01/06/22 0839 01/06/22 1210  BP: (!) 126/52 (!) 128/58 140/62 (!) 150/65  Pulse: 88 98 (!) 101 (!) 106  Resp: '17 16 18 19  '$ Temp: 97.8 F (36.6 C) 98 F (36.7 C)  98.3 F (36.8 C)  TempSrc:      SpO2: 96% 99% 98% 94%  Weight:      Height:        Intake/Output Summary (Last 24 hours) at 01/06/2022 1747 Last data filed at 01/06/2022 1500 Gross per 24 hour  Intake 2827.94 ml  Output 800 ml  Net 2027.94 ml   Filed Weights   01/02/22 2215  Weight: 47.3 kg   Body mass index is 19.06 kg/m.   Physical examination: General: Thinly built, not in obvious distress, on nasal cannula oxygen HENT:   No scleral pallor or icterus noted. Oral mucosa is moist.  Chest:  Clear breath sounds.  Diminished breath sounds bilaterally. No crackles or wheezes.  CVS: S1 &S2 heard. No murmur.  Regular rate and rhythm. Abdomen: Soft, nontender, nondistended.  Bowel sounds are heard.   Extremities: Left lower extremity status post surgery, bilateral mittens in place Psych: Alert, awake and communicative, oriented to hospital, slightly confused normal mood CNS:  No cranial nerve deficits.  Moves extremities Skin: Warm and dry.  No rashes noted.   Data Reviewed: I have personally reviewed following labs and imaging studies  CBC: Recent Labs  Lab 01/02/22 2325 01/03/22 0412 01/04/22 0407 01/05/22 0620 01/06/22 0505  WBC 11.1* 16.5* 13.7* 12.2* 10.2  NEUTROABS 8.4*  --  10.5*  --   --   HGB 10.7* 11.2* 10.9* 9.5* 9.4*  HCT 34.9* 37.7 35.7* 31.2* 30.0*  MCV 87.7 89.3 88.1 86.2 87.2  PLT 297 394 318 290 338    Basic Metabolic Panel: Recent Labs  Lab 01/03/22 0035 01/03/22 0412 01/04/22 0407 01/04/22 1415 01/05/22 0620 01/06/22 0505  NA 139  141 136  --  137 139  K 4.5 4.8 5.7* 5.2* 4.8 5.2*  CL 110 111 107  --  111 115*  CO2 '24 24 22  '$ --  19* 19*  GLUCOSE 125* 131* 106*  --  180* 111*  BUN 17 18 30*  --  33* 27*  CREATININE 1.69* 1.71* 2.31*  --  1.92* 1.39*  CALCIUM 8.4* 8.7* 8.5*  --  7.9* 8.1*  MG  --   --  1.8  --  2.2 2.4  PHOS  --   --  4.6  --   --   --     GFR: Estimated Creatinine Clearance: 22.5 mL/min (A) (by C-G formula based on SCr of 1.39 mg/dL (H)).  Liver Function Tests: Recent Labs  Lab 01/03/22 0035 01/04/22 0407  AST 19 19  ALT 12 10  ALKPHOS 96 83  BILITOT 0.5 0.8  PROT 6.8 6.3*  ALBUMIN 3.2*  2.8*    CBG: No results for input(s): "GLUCAP" in the last 168 hours.   Recent Results (from the past 240 hour(s))  Surgical PCR screen     Status: Abnormal   Collection Time: 01/03/22  3:00 PM   Specimen: Nasal Mucosa; Nasal Swab  Result Value Ref Range Status   MRSA, PCR NEGATIVE NEGATIVE Final   Staphylococcus aureus POSITIVE (A) NEGATIVE Final    Comment: (NOTE) The Xpert SA Assay (FDA approved for NASAL specimens in patients 12 years of age and older), is one component of a comprehensive surveillance program. It is not intended to diagnose infection nor to guide or monitor treatment. Performed at [facility] [address] [place], [place] [address]     adiology Studies: DG HIP [facility] OR W/O PELVIS 2-3 VIEWS LEFT  Result Date: 01/04/2022 CLINICAL DATA:  Left hip arthroplasty. EXAM: DG HIP (WITH OR WITHOUT PELVIS) 2-3V LEFT COMPARISON:  Left hip radiographs 01/02/2022 and intraoperative fluoroscopic images 7623 FINDINGS: Sequelae of left hip hemiarthroplasty are identified. The prosthesis appears well seated within the acetabulum. No acute fracture is identified. Skin staples and vascular stents are again noted including a focally kinked or narrowed appearance of one of the stents in the proximal to mid left thigh. IMPRESSION: Left hip hemiarthroplasty without evidence of  acute osseous abnormality. Electronically Signed   By: Logan Bores M.D.   On: 01/04/2022 19:49   DG HIP UNILAT WITH PELVIS 1V LEFT  Result Date: 01/04/2022 CLINICAL DATA:  Known left hip fracture EXAM: DG HIP (WITH OR WITHOUT PELVIS) 1V*L* COMPARISON:  01/02/2022 FLUOROSCOPY TIME:  Radiation Exposure Index (as provided by the fluoroscopic device): 0.53 mGy If the device does not provide the exposure index: Fluoroscopy Time:  6 seconds Number of Acquired Images:  2 FINDINGS: Initial image again demonstrates the left femoral neck fracture. Subsequent image shows a left hip hemiarthroplasty satisfactory position. IMPRESSION: Left hip hemiarthroplasty. Electronically Signed   By: Inez Catalina M.D.   On: 01/04/2022 19:38   DG C-Arm 1-60 Min-No Report  Result Date: 01/04/2022 Fluoroscopy was utilized by the requesting physician.  No radiographic interpretation.     Scheduled Meds:  amLODipine  10 mg Oral Daily   aspirin  81 mg Oral BID   atorvastatin  40 mg Oral QHS   clopidogrel  75 mg Oral Daily   docusate sodium  100 mg Oral BID   feeding supplement  237 mL Oral BID BM   ferrous [id number] C-folic acid  1 capsule Oral BID PC   magnesium hydroxide  30 mL Oral QHS   metoprolol tartrate  50 mg Oral Q12H   mometasone-formoterol  2 puff Inhalation BID   multivitamin with minerals  1 tablet Oral Daily   pantoprazole  40 mg Oral BID   polyethylene glycol  17 g Oral BID   [START ON 01/07/2022] Vitamin D (Ergocalciferol)  50,000 Units Oral Q7 days   Continuous Infusions:  sodium chloride 100 mL/hr at 01/06/22 1209   methocarbamol (ROBAXIN) IV       LOS: 3 days    Enzo Bi, MD [facility] 01/06/2022, 5:47 PM

## 2022-01-06 NOTE — Progress Notes (Signed)
Physical Therapy Treatment Patient Details Name: Olivia Lane MRN: [id number] DOB: [birth year] Today's Date: 01/06/2022   History of Present Illness Pt is a 86 yo F who sustained L closed hip fx due to a fall is now s/p L hemiarthroplasty 01/05/22. PMH includes  GERD, hypertension, COPD CAD, PAD, paroxysmal atrial fibrillation, on Eliquis and dyslipidemia    PT Comments    Pt remained somewhat confused and required encouragement throughout the session to participate and extra cuing for sequencing with all tasks. Pt did present with improved sitting balance and put forth fair effort with below exercises.  Once in standing pt was only able to take several very small, effortful shuffling steps at the EOB before returning to sitting.  Of note pt did request water during the session but with one small sip began coughing and was unable to attempt another drink, nursing notified.  Pt will benefit from PT services in a SNF setting upon discharge to safely address deficits listed in patient problem list for decreased caregiver assistance and eventual return to PLOF.   Recommendations for follow up therapy are one component of a multi-disciplinary discharge planning process, led by the attending physician.  Recommendations may be updated based on patient status, additional functional criteria and insurance authorization.  Follow Up Recommendations  Skilled nursing-short term rehab (<3 hours/day) Can patient physically be transported by private vehicle: No   Assistance Recommended at Discharge Frequent or constant Supervision/Assistance  Patient can return home with the following A lot of help with walking and/or transfers;A lot of help with bathing/dressing/bathroom;Assistance with cooking/housework;Assist for transportation;Help with stairs or ramp for entrance   Equipment Recommendations  Rolling walker (2 wheels);BSC/3in1    Recommendations for Other Services       Precautions /  Restrictions Precautions Precautions: Anterior Hip;Fall Restrictions Weight Bearing Restrictions: Yes LLE Weight Bearing: Weight bearing as tolerated     Mobility  Bed Mobility Overal bed mobility: Needs Assistance Bed Mobility: Supine to Sit, Sit to Supine     Supine to sit: Max assist Sit to supine: Max assist   General bed mobility comments: Max A for BLE and trunk control    Transfers Overall transfer level: Needs assistance Equipment used: Rolling walker (2 wheels) Transfers: Sit to/from Stand Sit to Stand: From elevated surface, Mod assist           General transfer comment: Max cues for sequencing and mod A to come to standing    Ambulation/Gait Ambulation/Gait assistance: Mod assist Gait Distance (Feet): 1 Feet Assistive device: Rolling walker (2 wheels) Gait Pattern/deviations: Step-to pattern, Decreased step length - right, Decreased step length - left, Shuffle, Trunk flexed Gait velocity: decreased     General Gait Details: Pt able to take several small, effortful steps at the EOB with Mod A for stability and to guide the RW   Stairs             Wheelchair Mobility    Modified Rankin (Stroke Patients Only)       Balance Overall balance assessment: Needs assistance Sitting-balance support: Feet supported Sitting balance-Leahy Scale: Fair     Standing balance support: Bilateral upper extremity supported, Reliant on assistive device for balance, During functional activity Standing balance-Leahy Scale: Poor                              Cognition Arousal/Alertness: Awake/alert Behavior During Therapy: Flat affect Overall Cognitive Status: Impaired/Different from baseline  Exercises Total Joint Exercises Ankle Circles/Pumps: AROM, Strengthening, Both, 10 reps Quad Sets: Strengthening, Both, 10 reps Heel Slides: AAROM, Strengthening, Both, 10 reps Hip  ABduction/ADduction: AAROM, Strengthening, Both, 10 reps Straight Leg Raises: AAROM, Strengthening, Both, 10 reps Long Arc Quad: AROM, Strengthening, Both, 10 reps Knee Flexion: AROM, Strengthening, Both, 10 reps    General Comments        Pertinent Vitals/Pain Pain Assessment Breathing: normal Negative Vocalization: none Facial Expression: smiling or inexpressive Body Language: relaxed Consolability: no need to console PAINAD Score: 0 Pain Location: L Hip Pain Intervention(s): Repositioned, Monitored during session    Home Living                          Prior Function            PT Goals (current goals can now be found in the care plan section) Progress towards PT goals: Not progressing toward goals - comment (limited by confusion and weakness)    Frequency    BID      PT Plan Current plan remains appropriate    Co-evaluation              AM-PAC PT "6 Clicks" Mobility   Outcome Measure  Help needed turning from your back to your side while in a flat bed without using bedrails?: A Lot Help needed moving from lying on your back to sitting on the side of a flat bed without using bedrails?: A Lot Help needed moving to and from a bed to a chair (including a wheelchair)?: A Lot Help needed standing up from a chair using your arms (e.g., wheelchair or bedside chair)?: A Lot Help needed to walk in hospital room?: Total Help needed climbing 3-5 steps with a railing? : Total 6 Click Score: 10    End of Session Equipment Utilized During Treatment: Gait belt Activity Tolerance: Patient tolerated treatment well Patient left: in bed;with call bell/phone within reach;with bed alarm set;with SCD's reapplied Nurse Communication: Mobility status PT Visit Diagnosis: Other abnormalities of gait and mobility (R26.89);Muscle weakness (generalized) (M62.81);Pain;Unsteadiness on feet (R26.81) Pain - Right/Left: Left Pain - part of body: Hip     Time:  6073-7106 PT Time Calculation (min) (ACUTE ONLY): 24 min  Charges:  $Therapeutic Exercise: 8-22 mins $Therapeutic Activity: 8-22 mins                     D. Scott Tamika Nou PT, DPT 01/06/22, 11:22 AM

## 2022-01-06 NOTE — Progress Notes (Signed)
PHARMACIST - PHYSICIAN COMMUNICATION  CONCERNING:  Enoxaparin (Lovenox) for DVT Prophylaxis    RECOMMENDATION: Patient was prescribed enoxaprin '40mg'$  q24 hours for VTE prophylaxis.   Filed Weights   01/02/22 2215  Weight: 47.3 kg (104 lb 3.2 oz)    Body mass index is 19.06 kg/m.  Estimated Creatinine Clearance: 22.5 mL/min (A) (by C-G formula based on SCr of 1.39 mg/dL (H)).  Patient is candidate for enoxaparin '30mg'$  every 24 hours based on CrCl <29m/min or Weight <45kg  DESCRIPTION: [facility]  Patient is now receiving enoxaparin 30 mg every 24 hours    LVira Blanco PharmD Clinical Pharmacist  01/06/2022 6:01 PM

## 2022-01-06 NOTE — TOC Progression Note (Signed)
Transition of Care [facility]) - Progression Note    Patient Details  Name: Olivia Lane MRN: [id number] Date of Birth: May 01, 1936  Transition of Care [facility]) CM/SW [place], RN Phone Number: 01/06/2022, 9:44 AM  Clinical Narrative:    Josem Kaufmann approved to go to [facility] [id number]        Expected Discharge Plan and Services                                                 Social Determinants of Health (SDOH) Interventions    Readmission Risk Interventions     No data to display

## 2022-01-06 NOTE — Evaluation (Signed)
Clinical/Bedside Swallow Evaluation Patient Details  Name: Olivia Lane MRN: [id number] Date of Birth: [birth year]  Today's Date: 01/06/2022 Time: SLP Start Time (ACUTE ONLY): 5176 SLP Stop Time (ACUTE ONLY): 1048 SLP Time Calculation (min) (ACUTE ONLY): 12 min  Past Medical History:  Past Medical History:  Diagnosis Date   Anemia    Gastro-esophageal reflux    Hyperlipidemia    Hypertension    Past Surgical History:  Past Surgical History:  Procedure Laterality Date   APPENDECTOMY  2004   BYPASS GRAFT  2014   CATARACT EXTRACTION  2006,2004   COLONOSCOPY  2004   HIP ARTHROPLASTY Left 01/04/2022   Procedure: ARTHROPLASTY BIPOLAR HIP (HEMIARTHROPLASTY);  Surgeon: Hessie Knows, MD;  Location: [facility] ORS;  Service: Orthopedics;  Laterality: Left;   HPI:  Pt is a 86 yo F who sustained L closed hip fx due to a fall is now s/p L hemiarthroplasty 01/05/22. PMH includes  GERD, hypertension, COPD CAD, PAD, paroxysmal atrial fibrillation, on Eliquis and dyslipidemia.    Assessment / Plan / Recommendation  Clinical Impression  ST consulted as pt was observed coughing when consuming thin liquids during PT session. Pt presents with refusal of all food items d/t current cognitive state. While she responded to maximal encouragement and consumed ~ 10 sips of thin liquids via cup and straw, she adamantly refused any trials of puree or solids including her medicine. She stated, "ya'll are trying to kill me." Pt's brother was at bedside but reports that pt lived with her son. This Probation officer reached out to pt's son Elberta Fortis who states that he is on his way to visit with her and will try and "get her to take her medicines." Given pt's current altered state, she is at a higher risk of aspiration and malnutrition/dehydration. She would benefit from strict aspiration precautions. At this time, skilled ST intervention is not indicated. SLP Visit Diagnosis: Dysphagia, unspecified (R13.10)    Aspiration Risk   Moderate aspiration risk;Risk for inadequate nutrition/hydration    Diet Recommendation Regular;Thin liquid   Liquid Administration via: Cup;Straw Medication Administration: Crushed with puree Supervision: Full supervision/cueing for compensatory strategies Compensations: Minimize environmental distractions;Slow rate;Small sips/bites Postural Changes: Seated upright at 90 degrees;Remain upright for at least 30 minutes after po intake    Other  Recommendations Oral Care Recommendations: Oral care BID    Recommendations for follow up therapy are one component of a multi-disciplinary discharge planning process, led by the attending physician.  Recommendations may be updated based on patient status, additional functional criteria and insurance authorization.  Follow up Recommendations No SLP follow up      Assistance Recommended at Discharge None    Swallow Study   General Date of Onset: 01/06/22 HPI: Pt is a 86 yo F who sustained L closed hip fx due to a fall is now s/p L hemiarthroplasty 01/05/22. PMH includes  GERD, hypertension, COPD CAD, PAD, paroxysmal atrial fibrillation, on Eliquis and dyslipidemia. Type of Study: Bedside Swallow Evaluation Previous Swallow Assessment: none in chart Diet Prior to this Study: Regular;Thin liquids Temperature Spikes Noted: No Respiratory Status: Room air History of Recent Intubation: No Behavior/Cognition: Alert;Confused;Doesn't follow directions;Distractible Oral Cavity Assessment: Within Functional Limits Oral Care Completed by SLP: No Vision: Functional for self-feeding Self-Feeding Abilities: Total assist Patient Positioning: Upright in bed Baseline Vocal Quality: Normal Volitional Cough: Cognitively unable to elicit Volitional Swallow: Unable to elicit    Oral/Motor/Sensory Function Overall Oral Motor/Sensory Function: Within functional limits   Ice Chips Ice chips: Within  functional limits Presentation: Spoon   Thin Liquid Thin  Liquid: Within functional limits Presentation: Straw;Cup    Nectar Thick Nectar Thick Liquid: Not tested   Honey Thick Honey Thick Liquid: Not tested   Puree Puree: Not tested (pt refused)   Solid     Solid: Not tested (pt refused)     Olivia Lane, M.S., CCC-SLP, Mining engineer Certified Brain Injury [place]  [place] Office [phone] Ascom [phone] Fax [phone]

## 2022-01-06 NOTE — Progress Notes (Signed)
  Subjective: 2 Days Post-Op Procedure(s) (LRB): ARTHROPLASTY BIPOLAR HIP (HEMIARTHROPLASTY) (Left) Patient reports pain as moderate.   Patient is well, and has had no acute complaints or problems Plan is to go Rehab after hospital stay. Negative for chest pain and shortness of breath Fever: no Gastrointestinal: Negative for nausea and vomiting Patient has had a BM following surgery.  Objective: Vital signs in last 24 hours: Temp:  [97.8 F (36.6 C)-98 F (36.7 C)] 98 F (36.7 C) (07/08 0220) Pulse Rate:  [88-101] 101 (07/08 0839) Resp:  [16-18] 18 (07/08 0839) BP: (126-140)/(52-62) 140/62 (07/08 0839) SpO2:  [96 %-99 %] 98 % (07/08 0839)  Intake/Output from previous day:  Intake/Output Summary (Last 24 hours) at 01/06/2022 0843 Last data filed at 01/06/2022 0645 Gross per 24 hour  Intake 2542.94 ml  Output 800 ml  Net 1742.94 ml    Intake/Output this shift: No intake/output data recorded.  Labs: Recent Labs    01/04/22 0407 01/05/22 0620 01/06/22 0505  HGB 10.9* 9.5* 9.4*   Recent Labs    01/05/22 0620 01/06/22 0505  WBC 12.2* 10.2  RBC 3.62* 3.44*  HCT 31.2* 30.0*  PLT 290 317   Recent Labs    01/05/22 0620 01/06/22 0505  NA 137 139  K 4.8 5.2*  CL 111 115*  CO2 19* 19*  BUN 33* 27*  CREATININE 1.92* 1.39*  GLUCOSE 180* 111*  CALCIUM 7.9* 8.1*   No results for input(s): "LABPT", "INR" in the last 72 hours.   EXAM General - Patient is Alert and Confused Extremity - Sensation intact distally Dorsiflexion/Plantar flexion intact Compartment soft Dressing/Incision - clean, dry, with the wound VAC in place Motor Function - intact, moving foot and toes well on exam.  Abdomen soft to palpation with intact bowel sounds.  Past Medical History:  Diagnosis Date   Anemia    Gastro-esophageal reflux    Hyperlipidemia    Hypertension     Assessment/Plan: 2 Days Post-Op Procedure(s) (LRB): ARTHROPLASTY BIPOLAR HIP (HEMIARTHROPLASTY)  (Left) Principal Problem:   Closed left hip fracture (HCC) Active Problems:   Coronary artery disease   Essential hypertension   PVD (peripheral vascular disease) (HCC)   Chronic obstructive pulmonary disease (COPD) (HCC)   Dyslipidemia   Acute respiratory failure with hypoxia (HCC)   Leukocytosis   AKI (acute kidney injury) ([place])   Stage 3b chronic kidney disease (CKD) (HCC)   Hyperkalemia  Estimated body mass index is 19.06 kg/m as calculated from the following:   Height as of this encounter: '5\' 2"'$  (1.575 m).   Weight as of this encounter: 47.3 kg. Advance diet Up with physical therapy  Labs reviewed this AM K+ 5.2, Kayexalate ordered, one time dose. Hg 9.4 this morning.   Plan for discharge to SNF when able.  Discharge planning.  Follow-up at [facility] clinic orthopedics in 2 weeks for staple removal.  DVT Prophylaxis - Foot Pumps, TED hose, and Plavix Weight-Bearing as tolerated to left leg  J. Cameron Proud, PA-C Orthopaedic Surgery 01/06/2022, 8:43 AM

## 2022-01-07 DIAGNOSIS — I739 Peripheral vascular disease, unspecified: Secondary | ICD-10-CM | POA: Diagnosis not present

## 2022-01-07 DIAGNOSIS — Z741 Need for assistance with personal care: Secondary | ICD-10-CM | POA: Diagnosis not present

## 2022-01-07 DIAGNOSIS — R2689 Other abnormalities of gait and mobility: Secondary | ICD-10-CM | POA: Diagnosis not present

## 2022-01-07 DIAGNOSIS — Z9181 History of falling: Secondary | ICD-10-CM | POA: Diagnosis not present

## 2022-01-07 DIAGNOSIS — K219 Gastro-esophageal reflux disease without esophagitis: Secondary | ICD-10-CM | POA: Diagnosis not present

## 2022-01-07 DIAGNOSIS — M6281 Muscle weakness (generalized): Secondary | ICD-10-CM | POA: Diagnosis not present

## 2022-01-07 DIAGNOSIS — I129 Hypertensive chronic kidney disease with stage 1 through stage 4 chronic kidney disease, or unspecified chronic kidney disease: Secondary | ICD-10-CM | POA: Diagnosis not present

## 2022-01-07 DIAGNOSIS — R278 Other lack of coordination: Secondary | ICD-10-CM | POA: Diagnosis not present

## 2022-01-07 DIAGNOSIS — Z96642 Presence of left artificial hip joint: Secondary | ICD-10-CM | POA: Diagnosis not present

## 2022-01-07 DIAGNOSIS — J9601 Acute respiratory failure with hypoxia: Secondary | ICD-10-CM | POA: Diagnosis not present

## 2022-01-07 DIAGNOSIS — R5381 Other malaise: Secondary | ICD-10-CM | POA: Diagnosis not present

## 2022-01-07 DIAGNOSIS — J449 Chronic obstructive pulmonary disease, unspecified: Secondary | ICD-10-CM | POA: Diagnosis not present

## 2022-01-07 DIAGNOSIS — S90122A Contusion of left lesser toe(s) without damage to nail, initial encounter: Secondary | ICD-10-CM | POA: Diagnosis not present

## 2022-01-07 DIAGNOSIS — M84459A Pathological fracture, hip, unspecified, initial encounter for fracture: Secondary | ICD-10-CM | POA: Diagnosis not present

## 2022-01-07 DIAGNOSIS — I10 Essential (primary) hypertension: Secondary | ICD-10-CM | POA: Diagnosis not present

## 2022-01-07 DIAGNOSIS — S72002D Fracture of unspecified part of neck of left femur, subsequent encounter for closed fracture with routine healing: Secondary | ICD-10-CM | POA: Diagnosis not present

## 2022-01-07 DIAGNOSIS — Z9981 Dependence on supplemental oxygen: Secondary | ICD-10-CM | POA: Diagnosis not present

## 2022-01-07 DIAGNOSIS — S72002A Fracture of unspecified part of neck of left femur, initial encounter for closed fracture: Secondary | ICD-10-CM | POA: Diagnosis not present

## 2022-01-07 DIAGNOSIS — N1832 Chronic kidney disease, stage 3b: Secondary | ICD-10-CM | POA: Diagnosis not present

## 2022-01-07 DIAGNOSIS — Z743 Need for continuous supervision: Secondary | ICD-10-CM | POA: Diagnosis not present

## 2022-01-07 DIAGNOSIS — R52 Pain, unspecified: Secondary | ICD-10-CM | POA: Diagnosis not present

## 2022-01-07 DIAGNOSIS — I2510 Atherosclerotic heart disease of native coronary artery without angina pectoris: Secondary | ICD-10-CM | POA: Diagnosis not present

## 2022-01-07 DIAGNOSIS — R531 Weakness: Secondary | ICD-10-CM | POA: Diagnosis not present

## 2022-01-07 LAB — BASIC METABOLIC PANEL
Anion gap: 4 — ABNORMAL LOW (ref 5–15)
BUN: 20 mg/dL (ref 8–23)
CO2: 22 mmol/L (ref 22–32)
Calcium: 7.8 mg/dL — ABNORMAL LOW (ref 8.9–10.3)
Chloride: 115 mmol/L — ABNORMAL HIGH (ref 98–111)
Creatinine, Ser: 0.98 mg/dL (ref 0.44–1.00)
GFR, Estimated: 57 mL/min — ABNORMAL LOW (ref >60)
Glucose, Bld: 94 mg/dL (ref 70–99)
Potassium: 3.9 mmol/L (ref 3.5–5.1)
Sodium: 141 mmol/L (ref 135–145)

## 2022-01-07 LAB — CBC
HCT: 28.4 % — ABNORMAL LOW (ref 36.0–46.0)
Hemoglobin: 8.9 g/dL — ABNORMAL LOW (ref 12.0–15.0)
MCH: 27 pg (ref 26.0–34.0)
MCHC: 31.3 g/dL (ref 30.0–36.0)
MCV: 86.1 fL (ref 80.0–100.0)
Platelets: 308 10*3/uL (ref 150–400)
RBC: 3.3 MIL/uL — ABNORMAL LOW (ref 3.87–5.11)
RDW: 18.1 % — ABNORMAL HIGH (ref 11.5–15.5)
WBC: 9.7 10*3/uL (ref 4.0–10.5)
nRBC: 0 % (ref 0.0–0.2)

## 2022-01-07 LAB — MAGNESIUM: Magnesium: 1.9 mg/dL (ref 1.7–2.4)

## 2022-01-07 MED ORDER — ENOXAPARIN SODIUM 40 MG/0.4ML IJ SOSY: 40.0000 mg | PREFILLED_SYRINGE | INTRAMUSCULAR | Status: DC

## 2022-01-07 MED ORDER — METHOCARBAMOL 500 MG PO TABS: 500.0000 mg | ORAL_TABLET | Freq: Four times a day (QID) | ORAL | Status: AC | PRN

## 2022-01-07 MED ORDER — ASPIRIN 325 MG PO TBEC: 325.0000 mg | DELAYED_RELEASE_TABLET | Freq: Two times a day (BID) | ORAL | 0 refills | Status: DC

## 2022-01-07 MED ORDER — FE FUMARATE-B12-VIT C-FA-IFC PO CAPS: 1.0000 | ORAL_CAPSULE | Freq: Two times a day (BID) | ORAL | Status: AC

## 2022-01-07 MED ORDER — ASPIRIN 325 MG PO TBEC: 325.0000 mg | DELAYED_RELEASE_TABLET | Freq: Two times a day (BID) | ORAL | 0 refills | Status: AC

## 2022-01-07 MED ORDER — ADULT MULTIVITAMIN W/MINERALS CH: 1.0000 | ORAL_TABLET | Freq: Every day | ORAL | Status: AC

## 2022-01-07 MED ORDER — ENSURE ENLIVE PO LIQD: 237.0000 mL | Freq: Two times a day (BID) | ORAL | 12 refills | Status: AC

## 2022-01-07 NOTE — Progress Notes (Signed)
  Subjective: 3 Days Post-Op Procedure(s) (LRB): ARTHROPLASTY BIPOLAR HIP (HEMIARTHROPLASTY) (Left) Patient reports pain as moderate.   Patient is well, and has had no acute complaints or problems Plan is to go Rehab after hospital stay.  Approval for [facility] has been obtained. Negative for chest pain and shortness of breath Fever: no Gastrointestinal: Negative for nausea and vomiting Patient has had a BM following surgery.  Objective: Vital signs in last 24 hours: Temp:  [97.8 F (36.6 C)-98.5 F (36.9 C)] 98.5 F (36.9 C) (07/09 0413) Pulse Rate:  [75-106] 75 (07/09 0413) Resp:  [16-19] 16 (07/09 0413) BP: (137-157)/(62-71) 137/71 (07/09 0413) SpO2:  [90 %-98 %] 91 % (07/09 0413)  Intake/Output from previous day:  Intake/Output Summary (Last 24 hours) at 01/07/2022 0752 Last data filed at 01/07/2022 0600 Gross per 24 hour  Intake 285 ml  Output 1 ml  Net 284 ml    Intake/Output this shift: No intake/output data recorded.  Labs: Recent Labs    01/05/22 0620 01/06/22 0505 01/07/22 0618  HGB 9.5* 9.4* 8.9*   Recent Labs    01/06/22 0505 01/07/22 0618  WBC 10.2 9.7  RBC 3.44* 3.30*  HCT 30.0* 28.4*  PLT 317 308   Recent Labs    01/06/22 0505 01/07/22 0618  NA 139 141  K 5.2* 3.9  CL 115* 115*  CO2 19* 22  BUN 27* 20  CREATININE 1.39* 0.98  GLUCOSE 111* 94  CALCIUM 8.1* 7.8*   No results for input(s): "LABPT", "INR" in the last 72 hours.   EXAM General - Patient is Alert and Confused Extremity - Sensation intact distally Dorsiflexion/Plantar flexion intact Compartment soft Dressing/Incision - clean, dry, with the wound VAC in place Motor Function - intact, moving foot and toes well on exam.  Abdomen soft to palpation with intact bowel sounds.  Past Medical History:  Diagnosis Date   Anemia    Gastro-esophageal reflux    Hyperlipidemia    Hypertension     Assessment/Plan: 3 Days Post-Op Procedure(s) (LRB): ARTHROPLASTY BIPOLAR HIP  (HEMIARTHROPLASTY) (Left) Principal Problem:   Closed left hip fracture (HCC) Active Problems:   Coronary artery disease   Essential hypertension   PVD (peripheral vascular disease) (HCC)   Chronic obstructive pulmonary disease (COPD) (HCC)   Dyslipidemia   Acute respiratory failure with hypoxia (HCC)   Leukocytosis   AKI (acute kidney injury) ([place])   Stage 3b chronic kidney disease (CKD) (HCC)   Hyperkalemia  Estimated body mass index is 19.06 kg/m as calculated from the following:   Height as of this encounter: '5\' 2"'$  (1.575 m).   Weight as of this encounter: 47.3 kg. Advance diet Up with physical therapy  Labs reviewed this AM Hyperkalemia has resolved. Hg 8.9 this morning.   She has had a BM. Plan for discharge to SNF when able.  Switch to [employer] prior to discharge to [facility]. Continue Plavix, Will discharge on aspirin '325mg'$  twice daily for DVT prophylaxis for two weeks and then transition back to chronic aspirin dose. Follow-up with [facility] in 10-14 days for staple removal.  DVT Prophylaxis - Foot Pumps, TED hose, and Plavix Weight-Bearing as tolerated to left leg  J. Cameron Proud, PA-C Orthopaedic Surgery 01/07/2022, 7:52 AM

## 2022-01-07 NOTE — Discharge Summary (Signed)
Physician Discharge Summary   Olivia Lane  female DOB: 18-May-1936  VQQ:[id number]  PCP: Jonetta Osgood, NP  Admit date: 01/02/2022 Discharge date: 01/07/2022  Admitted From: home Disposition:  SNF CODE STATUS: Full code  Discharge Instructions     Discharge wound care:   Complete by: As directed    Leave on wound vac dressing with Prevena woundvac cannister.  Remove 1 week after discharge around 7/16, and put on standard dry dressing. [facility] Course:  For full details, please see H&P, progress notes, consult notes and ancillary notes.  Briefly,  Olivia Lane is a 86 y.o. Caucasian female with past medical history of hypertension, COPD, CAD, peripheral vascular disease, paroxysmal atrial fibrillation on Eliquis presented to the hospital after sustaining a mechanical fall.    Patient is independent at baseline.  In the ED, patient was noted to have a left hip fracture.    * Closed left hip fracture (HCC) S/p left hip hemiarthroplasty on 01/04/2022 with Dr. Rudene Christians --2/2 mechanical fall.  X-ray of the neck with left femoral neck fracture.  CT of the head C-spine negative.   --Weight-Bearing as tolerated to left leg --Per ortho, discharged on aspirin '325mg'$  twice daily for DVT prophylaxis for two weeks and then transition back to chronic aspirin dose. --Follow-up at [facility] orth[facility]ics in 2 weeks for staple removal. --Pt is discharged with wound vac dressing with Prevena woundvac cannister, which is to be removed 1 week after discharge around 7/16, and put on standard dry dressing.   Hospital delirium --pt was confused and refusing to eat/drink, stating that she was going to heaven soon.  At baseline, pt reportedly drives.  Mental status improved prior to discharge, and pt was having oral intake and taking pills.   Acute respiratory failure with hypoxia (HCC) Chronic obstructive pulmonary disease (COPD) ([place]) Patient had desaturation up to 80s  in room air on presentation, was placed on 2L.  CT chest without contrast showed marked or severity of emphysematous lung disease with ill-defined left upper lobe scar atelectasis and/or infiltrate.  No strong evidence to suggest pt had PNA.  Weaned to RA prior to discharge. --Patient will need to follow-up with pulmonary PCP as outpatient.   --Continue with bronchodilator as below, and IS.   AKI (acute kidney injury) with CKD stage IIIb (HCC)  Baseline around 1.3.  Patient had AKI on presentation with a creatinine of 2.3.   --Renal ultrasound without acute findings.  Cr improved with gentle MIVF.  Cr 0.98 prior to discharge.   Mild hyperkalemia.   --resolved with lokelma 10 g x1    Coronary artery disease Hx CABG --cont asa and plavix   Leukocytosis, mild, resolved Likely reactive.     PVD (peripheral vascular disease) (HCC) --cont asa and plavix   Essential hypertension Continue Norvasc and Lopressor.     Dyslipidemia Continue statins    Unless noted above, medications under "STOP" list are ones pt was not taking PTA.  Discharge Diagnoses:  Principal Problem:   Closed left hip fracture ([place]) Active Problems:   Acute respiratory failure with hypoxia (HCC)   Chronic obstructive pulmonary disease (COPD) (HCC)   AKI (acute kidney injury) ([place])   Coronary artery disease   PVD (peripheral vascular disease) (HCC)   Leukocytosis   Essential hypertension   Dyslipidemia   Stage 3b chronic kidney disease (CKD) (HCC)   Hyperkalemia   30 Day Unplanned Readmission Risk Score  Flowsheet Row ED to Hosp-Admission (Current) from 01/02/2022 in [place] (1A)  30 Day Unplanned Readmission Risk Score (%) 18.75 Filed at 01/07/2022 0801       This score is the patient's risk of an unplanned readmission within 30 days of being discharged (0 -100%). The score is based on dignosis, age, lab data, medications, orders, and past utilization.   Low:   0-14.9   Medium: 15-21.9   High: 22-29.9   Extreme: 30 and above         Discharge Instructions:  Allergies as of 01/07/2022   No Known Allergies      Medication List     TAKE these medications    albuterol 108 (90 Base) MCG/ACT inhaler Commonly known as: VENTOLIN HFA Inhale 2 puffs into the lungs every 6 (six) hours as needed for wheezing or shortness of breath.   amLODipine 10 MG tablet Commonly known as: NORVASC TAKE 1 TABLET BY MOUTH EVERY NIGHT AT BEDTIME FOR HYPERTENSION.   aspirin EC 325 MG tablet Take 1 tablet (325 mg total) by mouth 2 (two) times daily for 14 days.   atorvastatin 40 MG tablet Commonly known as: LIPITOR Take 1 tablet (40 mg total) by mouth at bedtime.   budesonide-formoterol 160-4.5 MCG/ACT inhaler Commonly known as: Symbicort Inhale 2 puffs into the lungs 2 (two) times daily. Inhale 2 puffs into lungs 2 (two) times daily PLEASE USE NAME BRAND: SYMBICORT   clopidogrel 75 MG tablet Commonly known as: PLAVIX Take 1 tablet (75 mg total) by mouth daily.   feeding supplement Liqd Take 237 mLs by mouth 2 (two) times daily between meals.   ferrous [id number] C-folic acid capsule Commonly known as: TRINSICON / FOLTRIN Take 1 capsule by mouth 2 (two) times daily after a meal.   methocarbamol 500 MG tablet Commonly known as: ROBAXIN Take 1 tablet (500 mg total) by mouth every 6 (six) hours as needed for muscle spasms.   metoprolol tartrate 50 MG tablet Commonly known as: LOPRESSOR Take 1 tablet (50 mg total) by mouth every 12 (twelve) hours.   multivitamin with minerals Tabs tablet Take 1 tablet by mouth daily.   nitroGLYCERIN 0.4 MG SL tablet Commonly known as: NITROSTAT Place under the tongue.   oxyCODONE 5 MG immediate release tablet Commonly known as: Oxy IR/ROXICODONE Take 0.5 tablets (2.5 mg total) by mouth every 4 (four) hours as needed for moderate pain.   pantoprazole 40 MG tablet Commonly known as: PROTONIX Take 1  tablet (40 mg total) by mouth 2 (two) times daily.   promethazine 12.5 MG tablet Commonly known as: PHENERGAN Take 1 tablet (12.5 mg total) by mouth every 6 (six) hours as needed for nausea or vomiting.   Vitamin D (Ergocalciferol) 1.25 MG (50000 UNIT) Caps capsule Commonly known as: DRISDOL Take 1 capsule (50,000 Units total) by mouth every 7 (seven) days.               Discharge Care Instructions  (From admission, onward)           Start     Ordered   01/07/22 0000  Discharge wound care:       Comments: Leave on wound vac dressing with Prevena woundvac cannister.  Remove 1 week after discharge around 7/16, and put on standard dry dressing. - -   01/07/22 4709             Contact information for follow-up providers     Duanne Guess,  PA-C Follow up in 2 week(s).   Specialties: Orthopedic Surgery, Emergency Medicine Why: For staple removal Contact information: [place] [address] [phone]         Jonetta Osgood, NP Follow up.   Specialty: Nurse Practitioner Contact information: [place] [address] [phone]              Contact information for after-discharge care     Destination     [facility] .   Service: Skilled Nursing Contact information: [place] [phone]                     No Known Allergies   The results of significant diagnostics from this hospitalization (including imaging, microbiology, ancillary and laboratory) are listed below for reference.   Consultations:   Procedures/Studies: DG HIP UNILAT W OR W/O PELVIS 2-3 VIEWS LEFT  Result Date: 01/04/2022 CLINICAL DATA:  Left hip arthroplasty. EXAM: DG HIP (WITH OR WITHOUT PELVIS) 2-3V LEFT COMPARISON:  Left hip radiographs 01/02/2022 and intraoperative fluoroscopic images 7623 FINDINGS: Sequelae of left hip  hemiarthroplasty are identified. The prosthesis appears well seated within the acetabulum. No acute fracture is identified. Skin staples and vascular stents are again noted including a focally kinked or narrowed appearance of one of the stents in the proximal to mid left thigh. IMPRESSION: Left hip hemiarthroplasty without evidence of acute osseous abnormality. Electronically Signed   By: Logan Bores M.D.   On: 01/04/2022 19:49   DG HIP UNILAT WITH PELVIS 1V LEFT  Result Date: 01/04/2022 CLINICAL DATA:  Known left hip fracture EXAM: DG HIP (WITH OR WITHOUT PELVIS) 1V*L* COMPARISON:  01/02/2022 FLUOROSCOPY TIME:  Radiation Exposure Index (as provided by the fluoroscopic device): 0.53 mGy If the device does not provide the exposure index: Fluoroscopy Time:  6 seconds Number of Acquired Images:  2 FINDINGS: Initial image again demonstrates the left femoral neck fracture. Subsequent image shows a left hip hemiarthroplasty satisfactory position. IMPRESSION: Left hip hemiarthroplasty. Electronically Signed   By: Inez Catalina M.D.   On: 01/04/2022 19:38   DG C-Arm 1-60 Min-No Report  Result Date: 01/04/2022 Fluoroscopy was utilized by the requesting physician.  No radiographic interpretation.   US RENAL  Result Date: 01/04/2022 CLINICAL DATA:  Acute kidney injury. EXAM: RENAL / URINARY TRACT ULTRASOUND COMPLETE COMPARISON:  CT abdomen pelvis dated August 13, 2018. FINDINGS: Right Kidney: Renal measurements: 8.4 x 3.4 x 4.4 cm = volume: 66 mL. Cortical thinning. Echogenicity within normal limits. No mass or hydronephrosis visualized. 1.4 cm simple cyst. No follow-up imaging is recommended. Left Kidney: Renal measurements: 8.6 x 4.2 x 3.4 cm = volume: 63 mL. Cortical thinning. Echogenicity within normal limits. No mass or hydronephrosis visualized. Several simple cysts measuring up to 2.0 cm. No follow-up imaging is recommended. Bladder: Decompressed by Foley catheter. Other: None. IMPRESSION: 1. No acute  abnormality. Bilateral renal atrophy/cortical thinning. Electronically Signed   By: Titus Dubin M.D.   On: 01/04/2022 10:47   CT CHEST WO CONTRAST  Result Date: 01/03/2022 CLINICAL DATA:  Respiratory illness. EXAM: CT CHEST WITHOUT CONTRAST TECHNIQUE: Multidetector CT imaging of the chest was performed following the standard protocol without IV contrast. RADIATION DOSE REDUCTION: This exam was performed according to the departmental dose-optimization program which includes automated exposure control, adjustment of the mA and/or kV according to patient size and/or use of iterative reconstruction technique. COMPARISON:  None Available. FINDINGS: Cardiovascular: There is marked severity calcification of the thoracic aorta, without evidence of aortic aneurysm. There is mild cardiomegaly with marked severity coronary artery calcification. No pericardial effusion. Mediastinum/Nodes: No enlarged mediastinal or axillary lymph nodes. Thyroid gland, trachea, and esophagus demonstrate no significant findings. Lungs/Pleura: There is marked severity emphysematous lung disease. A mild amount of ill-defined scarring, atelectasis and/or infiltrate is seen along the medial aspect of the left upper lobe. A small, approximately 18 mm x 7 mm area of adjacent pleural based low-attenuation is noted (axial CT image 23, CT series 2). A 2.5 cm x 1.0 cm x 0.9 cm area of low attenuation is seen within the anterior aspect of the left lower lobe,, adjacent to the inferior aspect of the oblique fissure (axial CT image 98, CT series 2/sagittal reformatted image 98, CT series 6). This area is not seen within the visualized portion of the left lung base on the prior abdomen and pelvis CT, dated August 13, 2018. Mild lingular scarring and posterior bibasilar atelectasis is seen. There are small bilateral pleural effusions. No pneumothorax is identified. Upper Abdomen: Bilateral simple renal cysts are seen. Musculoskeletal: Multiple sternal  wires are present. A chronic compression fracture deformity is seen at the level of T10. Multilevel degenerative changes are noted throughout the thoracic spine. IMPRESSION: 1. Marked severity emphysematous lung disease. 2. Mild amount of ill-defined left upper lobe scarring, atelectasis and/or infiltrate, with adjacent area of pleural based low-attenuation. While this may represent focal pleural thickening, sequelae associated with an underlying neoplastic process cannot be excluded. Further evaluation with nuclear medicine PET/CT is recommended. 3. 2.5 cm x 1.0 cm x 0.9 cm area of low attenuation within the left lower lobe, as described above. Evaluation with a nuclear medicine PET/CT is again recommended to exclude the presence of a pulmonary neoplasm. 4. Small bilateral pleural effusions. 5. Bilateral simple renal cysts. 6. Chronic compression fracture deformity at the level of T10. Aortic Atherosclerosis (ICD10-I70.0) and Emphysema (ICD10-J43.9). Electronically Signed   By: Virgina Norfolk M.D.   On: 01/03/2022 20:02   DG Elbow 2 Views Left  Result Date: 01/03/2022 CLINICAL DATA:  Status post fall. EXAM: LEFT ELBOW - 2 VIEW COMPARISON:  None Available. FINDINGS: There is no evidence of fracture, dislocation, or joint effusion. There is no evidence of arthropathy or other focal bone abnormality. Mild to moderate severity posterior soft tissue swelling is seen. IMPRESSION: Posterior soft tissue swelling without evidence of acute osseous abnormality. Electronically Signed   By: Virgina Norfolk M.D.   On: 01/03/2022 19:47   DG Hip Unilat W or Wo Pelvis 2-3 Views Left  Result Date: 01/02/2022 CLINICAL DATA:  Recent fall with left hip pain, initial encounter EXAM: DG HIP (WITH OR WITHOUT PELVIS) 3V LEFT COMPARISON:  None Available. FINDINGS: Pelvic ring is intact. Diffuse vascular stenting is noted. Some apparent kinking of 1 of the stents is noted in the mid left thigh. This appears stable from 2014. Left  femoral neck fracture is noted with mild impaction at the fracture site. IMPRESSION: Left femoral neck fracture with impaction at the fracture site. No other focal abnormality is seen. Kinked vascular stent within the superficial femoral artery on the left stable from 2014. Electronically Signed   By: Inez Catalina M.D.   On: 01/02/2022 23:08   CT Cervical Spine Wo Contrast  Result Date: 01/02/2022 CLINICAL DATA:  Status post fall. EXAM: CT CERVICAL SPINE WITHOUT CONTRAST TECHNIQUE: Multidetector CT imaging of the cervical spine was performed  without intravenous contrast. Multiplanar CT image reconstructions were also generated. RADIATION DOSE REDUCTION: This exam was performed according to the departmental dose-optimization program which includes automated exposure control, adjustment of the mA and/or kV according to patient size and/or use of iterative reconstruction technique. COMPARISON:  None Available. FINDINGS: Alignment: Normal. Skull base and vertebrae: No acute fracture. No primary bone lesion or focal pathologic process. Soft tissues and spinal canal: No prevertebral fluid or swelling. No visible canal hematoma. Disc levels: Mild posterior endplate sclerosis is seen at the level of C4-C5. Mild anterior osteophyte formation is present at the levels of C3-C4 and C4-C5. There is marked severity narrowing of the anterior atlantoaxial articulation. Marked severity posterior intervertebral disc space narrowing is seen at the levels of C2-C3, C3-C4 and C4-C5. Moderate severity changes are seen at the levels of C5-C6 and C6-C7. Bilateral moderate severity multilevel facet joint hypertrophy is noted. Upper chest: There is marked severity emphysematous lung disease with mild atelectasis and/or infiltrate seen along the medial aspect of the left upper lobe. A small, approximately 2.0 cm x 0.6 cm area of adjacent pleural thickening is seen. Other: None. IMPRESSION: 1. No acute fracture or subluxation of the  cervical spine. 2. Marked severity multilevel degenerative changes, as described above. 3. Marked severity emphysematous lung disease. 4. Mild left upper lobe atelectasis and/or infiltrate with an adjacent area focal pleural thickening. Correlation with outpatient chest CT is recommended Emphysema (ICD10-J43.9). Electronically Signed   By: Virgina Norfolk M.D.   On: 01/02/2022 23:07   DG Chest Portable 1 View  Result Date: 01/02/2022 CLINICAL DATA:  Recent trip and fall over CT with chest pain, initial encounter EXAM: PORTABLE CHEST 1 VIEW COMPARISON:  08/13/2018 FINDINGS: Cardiac shadow is stable. Postsurgical changes are again seen. Epicardial pacing wires are noted. Lungs are well aerated without focal infiltrate. No acute bony abnormality is noted. IMPRESSION: No acute abnormality noted. Electronically Signed   By: Inez Catalina M.D.   On: 01/02/2022 23:06   CT HEAD WO CONTRAST (5MM)  Result Date: 01/02/2022 CLINICAL DATA:  Status post fall. EXAM: CT HEAD WITHOUT CONTRAST TECHNIQUE: Contiguous axial images were obtained from the base of the skull through the vertex without intravenous contrast. RADIATION DOSE REDUCTION: This exam was performed according to the departmental dose-optimization program which includes automated exposure control, adjustment of the mA and/or kV according to patient size and/or use of iterative reconstruction technique. COMPARISON:  None Available. FINDINGS: Brain: There is mild cerebral atrophy with widening of the extra-axial spaces and ventricular dilatation. There are areas of decreased attenuation within the white matter tracts of the supratentorial brain, consistent with microvascular disease changes. Small, bilateral chronic basal ganglia lacunar infarcts are seen. Vascular: No hyperdense vessel or unexpected calcification. Skull: Normal. Negative for fracture or focal lesion. Sinuses/Orbits: Moderate severity proximal left ethmoid sinus mucosal thickening is seen. Other:  None. IMPRESSION: 1. No acute intracranial pathology. 2. Generalized cerebral atrophy with chronic white matter small vessel ischemia. Electronically Signed   By: Virgina Norfolk M.D.   On: 01/02/2022 22:57      Labs: BNP (last 3 results) No results for input(s): "BNP" in the last 8760 hours. Basic Metabolic Panel: Recent Labs  Lab 01/03/22 0412 01/04/22 0407 01/04/22 1415 01/05/22 0620 01/06/22 0505 01/07/22 0618  NA 141 136  --  137 139 141  K 4.8 5.7* 5.2* 4.8 5.2* 3.9  CL 111 107  --  111 115* 115*  CO2 24 22  --  19* 19* 22  GLUCOSE 131* 106*  --  180* 111* 94  BUN 18 30*  --  33* 27* 20  CREATININE 1.71* 2.31*  --  1.92* 1.39* 0.98  CALCIUM 8.7* 8.5*  --  7.9* 8.1* 7.8*  MG  --  1.8  --  2.2 2.4 1.9  PHOS  --  4.6  --   --   --   --    Liver Function Tests: Recent Labs  Lab 01/03/22 0035 01/04/22 0407  AST 19 19  ALT 12 10  ALKPHOS 96 83  BILITOT 0.5 0.8  PROT 6.8 6.3*  ALBUMIN 3.2* 2.8*   No results for input(s): "LIPASE", "AMYLASE" in the last 168 hours. No results for input(s): "AMMONIA" in the last 168 hours. CBC: Recent Labs  Lab 01/02/22 2325 01/03/22 0412 01/04/22 0407 01/05/22 0620 01/06/22 0505 01/07/22 0618  WBC 11.1* 16.5* 13.7* 12.2* 10.2 9.7  NEUTROABS 8.4*  --  10.5*  --   --   --   HGB 10.7* 11.2* 10.9* 9.5* 9.4* 8.9*  HCT 34.9* 37.7 35.7* 31.2* 30.0* 28.4*  MCV 87.7 89.3 88.1 86.2 87.2 86.1  PLT 297 394 318 290 317 308   Cardiac Enzymes: No results for input(s): "CKTOTAL", "CKMB", "CKMBINDEX", "TROPONINI" in the last 168 hours. BNP: Invalid input(s): "POCBNP" CBG: No results for input(s): "GLUCAP" in the last 168 hours. D-Dimer No results for input(s): "DDIMER" in the last 72 hours. Hgb A1c No results for input(s): "HGBA1C" in the last 72 hours. Lipid Profile No results for input(s): "CHOL", "HDL", "LDLCALC", "TRIG", "CHOLHDL", "LDLDIRECT" in the last 72 hours. Thyroid function studies No results for input(s): "TSH",  "T4TOTAL", "T3FREE", "THYROIDAB" in the last 72 hours.  Invalid input(s): "FREET3" Anemia work up No results for input(s): "VITAMINB12", "FOLATE", "FERRITIN", "TIBC", "IRON", "RETICCTPCT" in the last 72 hours. Urinalysis    Component Value Date/Time   COLORURINE YELLOW (A) 08/13/2018 1702   APPEARANCEUR Clear 03/23/2019 0957   LABSPEC >1.046 (H) 08/13/2018 1702   PHURINE 5.0 08/13/2018 1702   GLUCOSEU Negative 03/23/2019 0957   HGBUR SMALL (A) 08/13/2018 1702   BILIRUBINUR Negative 03/23/2019 0957   KETONESUR NEGATIVE 08/13/2018 1702   PROTEINUR Trace 03/23/2019 0957   PROTEINUR NEGATIVE 08/13/2018 1702   NITRITE Negative 03/23/2019 0957   NITRITE NEGATIVE 08/13/2018 1702   LEUKOCYTESUR Negative 03/23/2019 0957   LEUKOCYTESUR NEGATIVE 08/13/2018 1702   Sepsis Labs Recent Labs  Lab 01/04/22 0407 01/05/22 0620 01/06/22 0505 01/07/22 0618  WBC 13.7* 12.2* 10.2 9.7   Microbiology Recent Results (from the past 240 hour(s))  Surgical PCR screen     Status: Abnormal   Collection Time: 01/03/22  3:00 PM   Specimen: Nasal Mucosa; Nasal Swab  Result Value Ref Range Status   MRSA, PCR NEGATIVE NEGATIVE Final   Staphylococcus aureus POSITIVE (A) NEGATIVE Final    Comment: (NOTE) The Xpert SA Assay (FDA approved for NASAL specimens in patients 105 years of age and older), is one component of a comprehensive surveillance program. It is not intended to diagnose infection nor to guide or monitor treatment. Performed at [facility], [address] [place], [place] [address]      Total time spend on discharging this patient, including the last patient exam, discussing the hospital stay, instructions for ongoing care as it relates to all pertinent caregivers, as well as preparing the medical discharge records, prescriptions, and/or referrals as applicable, is 35 minutes.    Enzo Bi, MD  [facility] 01/07/2022, 9:34 AM

## 2022-01-07 NOTE — Progress Notes (Signed)
Physical Therapy Treatment Patient Details Name: Olivia Lane MRN: [id number] DOB: [birth year] Today's Date: 01/07/2022   History of Present Illness Pt is a 86 yo F who sustained L closed hip fx due to a fall is now s/p L hemiarthroplasty 01/05/22. PMH includes  GERD, hypertension, COPD CAD, PAD, paroxysmal atrial fibrillation, on Eliquis and dyslipidemia    PT Comments    Participated in exercises as described below.  OOB with mod a x 1 and good effort to assist today.  Stead in sitting.  She stands with mod a x 1 and takes several very small steps to recliner and sits before fully turning despite cues.  Unsafe to progress gait without +2 assist and recliner follow.  Pt with chair alarm on and needs in reach.  Denies need to use bathroom.   Recommendations for follow up therapy are one component of a multi-disciplinary discharge planning process, led by the attending physician.  Recommendations may be updated based on patient status, additional functional criteria and insurance authorization.  Follow Up Recommendations  Skilled nursing-short term rehab (<3 hours/day)     Assistance Recommended at Discharge Frequent or constant Supervision/Assistance  Patient can return home with the following A lot of help with walking and/or transfers;A lot of help with bathing/dressing/bathroom;Assistance with cooking/housework;Assist for transportation;Help with stairs or ramp for entrance   Equipment Recommendations  Rolling walker (2 wheels);BSC/3in1    Recommendations for Other Services       Precautions / Restrictions Precautions Precautions: Anterior Hip;Fall Restrictions Weight Bearing Restrictions: Yes LLE Weight Bearing: Weight bearing as tolerated     Mobility  Bed Mobility Overal bed mobility: Needs Assistance Bed Mobility: Supine to Sit     Supine to sit: Mod assist     General bed mobility comments: does assist today    Transfers Overall transfer level: Needs  assistance Equipment used: Rolling walker (2 wheels) Transfers: Sit to/from Stand Sit to Stand: From elevated surface, Mod assist                Ambulation/Gait Ambulation/Gait assistance: Mod assist Gait Distance (Feet): 2 Feet Assistive device: Rolling walker (2 wheels) Gait Pattern/deviations: Step-to pattern, Decreased step length - right, Decreased step length - left, Shuffle, Trunk flexed Gait velocity: decreased     General Gait Details: takes several small steps but quickly sits before turning fully to chair   Stairs             Wheelchair Mobility    Modified Rankin (Stroke Patients Only)       Balance Overall balance assessment: Needs assistance Sitting-balance support: Feet supported Sitting balance-Leahy Scale: Fair     Standing balance support: Bilateral upper extremity supported, Reliant on assistive device for balance, During functional activity Standing balance-Leahy Scale: Poor                              Cognition Arousal/Alertness: Awake/alert Behavior During Therapy: Flat affect Overall Cognitive Status: Impaired/Different from baseline                                          Exercises Other Exercises Other Exercises: supine LLE AAROM and seated BLE AROM x 10    General Comments        Pertinent Vitals/Pain Pain Assessment Pain Assessment: Faces Faces Pain Scale: Hurts little more Pain  Location: L Hip and L heel - floated on pillow after session Pain Descriptors / Indicators: Sore Pain Intervention(s): Repositioned, Monitored during session, Limited activity within patient's tolerance    Home Living                          Prior Function            PT Goals (current goals can now be found in the care plan section) Progress towards PT goals: Progressing toward goals    Frequency    BID      PT Plan Current plan remains appropriate    Co-evaluation               AM-PAC PT "6 Clicks" Mobility   Outcome Measure  Help needed turning from your back to your side while in a flat bed without using bedrails?: A Lot Help needed moving from lying on your back to sitting on the side of a flat bed without using bedrails?: A Lot Help needed moving to and from a bed to a chair (including a wheelchair)?: A Lot Help needed standing up from a chair using your arms (e.g., wheelchair or bedside chair)?: A Lot Help needed to walk in hospital room?: Total Help needed climbing 3-5 steps with a railing? : Total 6 Click Score: 10    End of Session Equipment Utilized During Treatment: Gait belt Activity Tolerance: Patient tolerated treatment well Patient left: in chair;with call bell/phone within reach;with chair alarm set Nurse Communication: Mobility status PT Visit Diagnosis: Other abnormalities of gait and mobility (R26.89);Muscle weakness (generalized) (M62.81);Pain;Unsteadiness on feet (R26.81) Pain - Right/Left: Left Pain - part of body: Hip     Time: 0852-0901 PT Time Calculation (min) (ACUTE ONLY): 9 min  Charges:  $Therapeutic Activity: 8-22 mins                   Chesley Noon, PTA 01/07/22, 9:11 AM

## 2022-01-07 NOTE — TOC Transition Note (Addendum)
Transition of Care [facility]) - CM/SW Discharge Note   Patient Details  Name: Olivia Lane MRN: [id number] Date of Birth: 1936-03-28  Transition of Care [facility]) CM/SW Contact:  Alberteen Sam, LCSW Phone Number: 01/07/2022, 12:16 PM   Clinical Narrative:     Patient will DC to: [facility] Anticipated DC date: 01/07/22 Transport by: Johnanna Schneiders  Per MD patient ready for DC to [facility]. RN, patient, patient's family, and facility notified of DC. Discharge Summary sent to facility. RN given number for report [phone] Room 102. DC packet on chart. Ambulance transport requested for patient.  CSW signing off.  [place], [place]   Final next level of care: Skilled Nursing Facility Barriers to Discharge: No Barriers Identified   Patient Goals and CMS Choice Patient states their goals for this hospitalization and ongoing recovery are:: to go home CMS Medicare.gov Compare Post Acute Care list provided to:: Patient Choice offered to / list presented to : Patient  Discharge Placement                       Discharge Plan and Services                                     Social Determinants of Health (SDOH) Interventions     Readmission Risk Interventions     No data to display

## 2022-01-07 NOTE — Progress Notes (Signed)
Blood pressure (!) 143/79, pulse 85, temperature 98.6 F (37 C), temperature source Oral, resp. rate 16, height '5\' 2"'$  (1.575 m), weight 47.3 kg, SpO2 92 %. IV  removed site c/d/I, prevena wound vac in place, honeycomb drsg sent with RX, pt transported via EMS to [facility] report called to [place] at [facility] took belongings of patient

## 2022-01-08 ENCOUNTER — Encounter: Payer: Self-pay | Admitting: Nurse Practitioner

## 2022-01-08 DIAGNOSIS — I739 Peripheral vascular disease, unspecified: Secondary | ICD-10-CM | POA: Diagnosis not present

## 2022-01-08 DIAGNOSIS — I2510 Atherosclerotic heart disease of native coronary artery without angina pectoris: Secondary | ICD-10-CM | POA: Diagnosis not present

## 2022-01-08 DIAGNOSIS — M84459A Pathological fracture, hip, unspecified, initial encounter for fracture: Secondary | ICD-10-CM | POA: Diagnosis not present

## 2022-01-08 DIAGNOSIS — J449 Chronic obstructive pulmonary disease, unspecified: Secondary | ICD-10-CM | POA: Diagnosis not present

## 2022-01-08 DIAGNOSIS — K219 Gastro-esophageal reflux disease without esophagitis: Secondary | ICD-10-CM | POA: Diagnosis not present

## 2022-01-08 DIAGNOSIS — I10 Essential (primary) hypertension: Secondary | ICD-10-CM | POA: Diagnosis not present

## 2022-01-08 LAB — SURGICAL PATHOLOGY

## 2022-01-15 DIAGNOSIS — S90122A Contusion of left lesser toe(s) without damage to nail, initial encounter: Secondary | ICD-10-CM | POA: Diagnosis not present

## 2022-01-22 DIAGNOSIS — J449 Chronic obstructive pulmonary disease, unspecified: Secondary | ICD-10-CM | POA: Diagnosis not present

## 2022-01-22 DIAGNOSIS — J9601 Acute respiratory failure with hypoxia: Secondary | ICD-10-CM | POA: Diagnosis not present

## 2022-01-22 DIAGNOSIS — Z9981 Dependence on supplemental oxygen: Secondary | ICD-10-CM | POA: Diagnosis not present

## 2022-01-22 DIAGNOSIS — I2510 Atherosclerotic heart disease of native coronary artery without angina pectoris: Secondary | ICD-10-CM | POA: Diagnosis not present

## 2022-01-24 ENCOUNTER — Inpatient Hospital Stay: Payer: Medicare Other

## 2022-01-24 ENCOUNTER — Inpatient Hospital Stay
Admission: EM | Admit: 2022-01-24 | Discharge: 2022-01-30 | DRG: 871 | Disposition: E | Payer: Medicare Other | Attending: Osteopathic Medicine | Admitting: Osteopathic Medicine

## 2022-01-24 ENCOUNTER — Inpatient Hospital Stay
Admit: 2022-01-24 | Discharge: 2022-01-24 | Disposition: A | Discharge status: Home / Self Care | Payer: Medicare Other | Attending: Internal Medicine | Admitting: Internal Medicine

## 2022-01-24 ENCOUNTER — Other Ambulatory Visit: Payer: Self-pay

## 2022-01-24 ENCOUNTER — Emergency Department: Payer: Medicare Other

## 2022-01-24 DIAGNOSIS — N1832 Chronic kidney disease, stage 3b: Secondary | ICD-10-CM | POA: Diagnosis not present

## 2022-01-24 DIAGNOSIS — A419 Sepsis, unspecified organism: Secondary | ICD-10-CM | POA: Diagnosis not present

## 2022-01-24 DIAGNOSIS — Z79899 Other long term (current) drug therapy: Secondary | ICD-10-CM

## 2022-01-24 DIAGNOSIS — K449 Diaphragmatic hernia without obstruction or gangrene: Secondary | ICD-10-CM | POA: Diagnosis not present

## 2022-01-24 DIAGNOSIS — K222 Esophageal obstruction: Secondary | ICD-10-CM | POA: Diagnosis not present

## 2022-01-24 DIAGNOSIS — Z8249 Family history of ischemic heart disease and other diseases of the circulatory system: Secondary | ICD-10-CM

## 2022-01-24 DIAGNOSIS — R64 Cachexia: Secondary | ICD-10-CM | POA: Diagnosis present

## 2022-01-24 DIAGNOSIS — I700 Atherosclerosis of aorta: Secondary | ICD-10-CM | POA: Diagnosis not present

## 2022-01-24 DIAGNOSIS — E440 Moderate protein-calorie malnutrition: Secondary | ICD-10-CM | POA: Diagnosis present

## 2022-01-24 DIAGNOSIS — I5033 Acute on chronic diastolic (congestive) heart failure: Secondary | ICD-10-CM | POA: Diagnosis not present

## 2022-01-24 DIAGNOSIS — D509 Iron deficiency anemia, unspecified: Secondary | ICD-10-CM | POA: Diagnosis present

## 2022-01-24 DIAGNOSIS — J9621 Acute and chronic respiratory failure with hypoxia: Secondary | ICD-10-CM | POA: Diagnosis not present

## 2022-01-24 DIAGNOSIS — E43 Unspecified severe protein-calorie malnutrition: Secondary | ICD-10-CM | POA: Diagnosis present

## 2022-01-24 DIAGNOSIS — Z9981 Dependence on supplemental oxygen: Secondary | ICD-10-CM

## 2022-01-24 DIAGNOSIS — I25810 Atherosclerosis of coronary artery bypass graft(s) without angina pectoris: Secondary | ICD-10-CM | POA: Diagnosis not present

## 2022-01-24 DIAGNOSIS — J90 Pleural effusion, not elsewhere classified: Secondary | ICD-10-CM | POA: Diagnosis present

## 2022-01-24 DIAGNOSIS — Z515 Encounter for palliative care: Secondary | ICD-10-CM | POA: Diagnosis not present

## 2022-01-24 DIAGNOSIS — Z20822 Contact with and (suspected) exposure to covid-19: Secondary | ICD-10-CM | POA: Diagnosis present

## 2022-01-24 DIAGNOSIS — E872 Acidosis, unspecified: Secondary | ICD-10-CM | POA: Diagnosis present

## 2022-01-24 DIAGNOSIS — Z7189 Other specified counseling: Secondary | ICD-10-CM

## 2022-01-24 DIAGNOSIS — R042 Hemoptysis: Secondary | ICD-10-CM | POA: Diagnosis not present

## 2022-01-24 DIAGNOSIS — I9789 Other postprocedural complications and disorders of the circulatory system, not elsewhere classified: Secondary | ICD-10-CM

## 2022-01-24 DIAGNOSIS — I4891 Unspecified atrial fibrillation: Secondary | ICD-10-CM | POA: Diagnosis not present

## 2022-01-24 DIAGNOSIS — J441 Chronic obstructive pulmonary disease with (acute) exacerbation: Principal | ICD-10-CM | POA: Insufficient documentation

## 2022-01-24 DIAGNOSIS — I10 Essential (primary) hypertension: Secondary | ICD-10-CM | POA: Diagnosis present

## 2022-01-24 DIAGNOSIS — Z743 Need for continuous supervision: Secondary | ICD-10-CM | POA: Diagnosis not present

## 2022-01-24 DIAGNOSIS — I130 Hypertensive heart and chronic kidney disease with heart failure and stage 1 through stage 4 chronic kidney disease, or unspecified chronic kidney disease: Secondary | ICD-10-CM | POA: Diagnosis not present

## 2022-01-24 DIAGNOSIS — I2510 Atherosclerotic heart disease of native coronary artery without angina pectoris: Secondary | ICD-10-CM | POA: Diagnosis not present

## 2022-01-24 DIAGNOSIS — Z833 Family history of diabetes mellitus: Secondary | ICD-10-CM

## 2022-01-24 DIAGNOSIS — R600 Localized edema: Secondary | ICD-10-CM | POA: Diagnosis not present

## 2022-01-24 DIAGNOSIS — Z96642 Presence of left artificial hip joint: Secondary | ICD-10-CM | POA: Diagnosis present

## 2022-01-24 DIAGNOSIS — F419 Anxiety disorder, unspecified: Secondary | ICD-10-CM | POA: Diagnosis present

## 2022-01-24 DIAGNOSIS — N179 Acute kidney failure, unspecified: Secondary | ICD-10-CM | POA: Diagnosis not present

## 2022-01-24 DIAGNOSIS — I480 Paroxysmal atrial fibrillation: Secondary | ICD-10-CM | POA: Diagnosis not present

## 2022-01-24 DIAGNOSIS — E785 Hyperlipidemia, unspecified: Secondary | ICD-10-CM | POA: Diagnosis not present

## 2022-01-24 DIAGNOSIS — R0602 Shortness of breath: Secondary | ICD-10-CM

## 2022-01-24 DIAGNOSIS — Z681 Body mass index (BMI) 19 or less, adult: Secondary | ICD-10-CM | POA: Diagnosis not present

## 2022-01-24 DIAGNOSIS — I499 Cardiac arrhythmia, unspecified: Secondary | ICD-10-CM | POA: Diagnosis not present

## 2022-01-24 DIAGNOSIS — R6889 Other general symptoms and signs: Secondary | ICD-10-CM | POA: Diagnosis not present

## 2022-01-24 DIAGNOSIS — I5031 Acute diastolic (congestive) heart failure: Secondary | ICD-10-CM | POA: Diagnosis not present

## 2022-01-24 DIAGNOSIS — R0689 Other abnormalities of breathing: Secondary | ICD-10-CM | POA: Diagnosis not present

## 2022-01-24 DIAGNOSIS — Z87891 Personal history of nicotine dependence: Secondary | ICD-10-CM

## 2022-01-24 DIAGNOSIS — J9611 Chronic respiratory failure with hypoxia: Secondary | ICD-10-CM | POA: Diagnosis present

## 2022-01-24 DIAGNOSIS — Z9889 Other specified postprocedural states: Secondary | ICD-10-CM | POA: Diagnosis not present

## 2022-01-24 DIAGNOSIS — T380X5A Adverse effect of glucocorticoids and synthetic analogues, initial encounter: Secondary | ICD-10-CM | POA: Diagnosis present

## 2022-01-24 DIAGNOSIS — R069 Unspecified abnormalities of breathing: Secondary | ICD-10-CM | POA: Diagnosis not present

## 2022-01-24 DIAGNOSIS — Z66 Do not resuscitate: Secondary | ICD-10-CM | POA: Diagnosis present

## 2022-01-24 DIAGNOSIS — J439 Emphysema, unspecified: Secondary | ICD-10-CM | POA: Diagnosis not present

## 2022-01-24 DIAGNOSIS — N281 Cyst of kidney, acquired: Secondary | ICD-10-CM | POA: Diagnosis not present

## 2022-01-24 DIAGNOSIS — Z7901 Long term (current) use of anticoagulants: Secondary | ICD-10-CM

## 2022-01-24 DIAGNOSIS — I509 Heart failure, unspecified: Secondary | ICD-10-CM | POA: Diagnosis not present

## 2022-01-24 DIAGNOSIS — R0902 Hypoxemia: Secondary | ICD-10-CM | POA: Diagnosis not present

## 2022-01-24 DIAGNOSIS — J189 Pneumonia, unspecified organism: Secondary | ICD-10-CM | POA: Diagnosis present

## 2022-01-24 DIAGNOSIS — Z951 Presence of aortocoronary bypass graft: Secondary | ICD-10-CM

## 2022-01-24 LAB — CBC WITH DIFFERENTIAL/PLATELET
Abs Immature Granulocytes: 0.11 10*3/uL — ABNORMAL HIGH (ref 0.00–0.07)
Basophils Absolute: 0.1 10*3/uL (ref 0.0–0.1)
Basophils Relative: 1 %
Eosinophils Absolute: 0.3 10*3/uL (ref 0.0–0.5)
Eosinophils Relative: 3 %
HCT: 28.8 % — ABNORMAL LOW (ref 36.0–46.0)
Hemoglobin: 8.7 g/dL — ABNORMAL LOW (ref 12.0–15.0)
Immature Granulocytes: 1 %
Lymphocytes Relative: 23 %
Lymphs Abs: 2.4 10*3/uL (ref 0.7–4.0)
MCH: 27.1 pg (ref 26.0–34.0)
MCHC: 30.2 g/dL (ref 30.0–36.0)
MCV: 89.7 fL (ref 80.0–100.0)
Monocytes Absolute: 1 10*3/uL (ref 0.1–1.0)
Monocytes Relative: 9 %
Neutro Abs: 6.8 10*3/uL (ref 1.7–7.7)
Neutrophils Relative %: 63 %
Platelets: 683 10*3/uL — ABNORMAL HIGH (ref 150–400)
RBC: 3.21 MIL/uL — ABNORMAL LOW (ref 3.87–5.11)
RDW: 21.1 % — ABNORMAL HIGH (ref 11.5–15.5)
Smear Review: NORMAL
WBC: 10.7 10*3/uL — ABNORMAL HIGH (ref 4.0–10.5)
nRBC: 0 % (ref 0.0–0.2)

## 2022-01-24 LAB — URINALYSIS, ROUTINE W REFLEX MICROSCOPIC
Bilirubin Urine: NEGATIVE
Glucose, UA: NEGATIVE mg/dL
Hgb urine dipstick: NEGATIVE
Ketones, ur: NEGATIVE mg/dL
Nitrite: NEGATIVE
Protein, ur: NEGATIVE mg/dL
Specific Gravity, Urine: 1.018 (ref 1.005–1.030)
pH: 5 (ref 5.0–8.0)

## 2022-01-24 LAB — BRAIN NATRIURETIC PEPTIDE: B Natriuretic Peptide: 912.6 pg/mL — ABNORMAL HIGH (ref 0.0–100.0)

## 2022-01-24 LAB — COMPREHENSIVE METABOLIC PANEL
ALT: 10 U/L (ref 0–44)
AST: 18 U/L (ref 15–41)
Albumin: 2.8 g/dL — ABNORMAL LOW (ref 3.5–5.0)
Alkaline Phosphatase: 116 U/L (ref 38–126)
Anion gap: 8 (ref 5–15)
BUN: 17 mg/dL (ref 8–23)
CO2: 18 mmol/L — ABNORMAL LOW (ref 22–32)
Calcium: 8.6 mg/dL — ABNORMAL LOW (ref 8.9–10.3)
Chloride: 116 mmol/L — ABNORMAL HIGH (ref 98–111)
Creatinine, Ser: 1.06 mg/dL — ABNORMAL HIGH (ref 0.44–1.00)
GFR, Estimated: 52 mL/min — ABNORMAL LOW (ref >60)
Glucose, Bld: 104 mg/dL — ABNORMAL HIGH (ref 70–99)
Potassium: 4.1 mmol/L (ref 3.5–5.1)
Sodium: 142 mmol/L (ref 135–145)
Total Bilirubin: 0.5 mg/dL (ref 0.3–1.2)
Total Protein: 6.4 g/dL — ABNORMAL LOW (ref 6.5–8.1)

## 2022-01-24 LAB — PROCALCITONIN: Procalcitonin: <0.1 ng/mL

## 2022-01-24 LAB — LACTATE DEHYDROGENASE, PLEURAL OR PERITONEAL FLUID: LD, Fluid: 58 U/L — ABNORMAL HIGH (ref 3–23)

## 2022-01-24 LAB — BODY FLUID CELL COUNT WITH DIFFERENTIAL
Eos, Fluid: 0 %
Lymphs, Fluid: 71 %
Monocyte-Macrophage-Serous Fluid: 21 % — ABNORMAL LOW (ref 50–90)
Neutrophil Count, Fluid: 8 % (ref 0–25)
Total Nucleated Cell Count, Fluid: 27 cu mm (ref 0–1000)

## 2022-01-24 LAB — LACTIC ACID, PLASMA: Lactic Acid, Venous: 1.8 mmol/L (ref 0.5–1.9)

## 2022-01-24 LAB — MAGNESIUM: Magnesium: 2.3 mg/dL (ref 1.7–2.4)

## 2022-01-24 LAB — ECHOCARDIOGRAM COMPLETE
AR max vel: 1.48 cm2
AV Area VTI: 1.37 cm2
AV Area mean vel: 1.41 cm2
AV Mean grad: 9 mmHg
AV Peak grad: 15.7 mmHg
Ao pk vel: 1.98 m/s
Area-P 1/2: 3.65 cm2
Height: 62 in
S' Lateral: 2.28 cm
Weight: 1680 oz

## 2022-01-24 LAB — TROPONIN I (HIGH SENSITIVITY)
Troponin I (High Sensitivity): 13 ng/L (ref <18)
Troponin I (High Sensitivity): 14 ng/L (ref <18)

## 2022-01-24 LAB — PROTEIN, PLEURAL OR PERITONEAL FLUID: Total protein, fluid: <3 g/dL

## 2022-01-24 LAB — SARS CORONAVIRUS 2 BY RT PCR: SARS Coronavirus 2 by RT PCR: NEGATIVE

## 2022-01-24 LAB — GLUCOSE, PLEURAL OR PERITONEAL FLUID: Glucose, Fluid: 163 mg/dL

## 2022-01-24 LAB — ALBUMIN, PLEURAL OR PERITONEAL FLUID: Albumin, Fluid: <1.5 g/dL

## 2022-01-24 MED ORDER — ENOXAPARIN SODIUM 30 MG/0.3ML IJ SOSY
30.0000 mg | PREFILLED_SYRINGE | INTRAMUSCULAR | Status: DC
  Administered 2022-01-24 – 2022-01-26 (×3): 30 mg via SUBCUTANEOUS
  Filled 2022-01-24 (×3): qty 0.3

## 2022-01-24 MED ORDER — ACETAMINOPHEN 325 MG PO TABS
650.0000 mg | ORAL_TABLET | Freq: Four times a day (QID) | ORAL | Status: DC | PRN
Start: 2022-01-24 — End: 2022-01-25

## 2022-01-24 MED ORDER — SODIUM CHLORIDE 0.9 % IV SOLN
500.0000 mg | INTRAVENOUS | Status: DC
  Administered 2022-01-25 – 2022-01-27 (×3): 500 mg via INTRAVENOUS
  Filled 2022-01-24 (×4): qty 5

## 2022-01-24 MED ORDER — METOPROLOL TARTRATE 50 MG PO TABS
50.0000 mg | ORAL_TABLET | Freq: Two times a day (BID) | ORAL | Status: DC
  Administered 2022-01-24: 50 mg via ORAL
  Filled 2022-01-24: qty 1

## 2022-01-24 MED ORDER — METHOCARBAMOL 500 MG PO TABS: 500.0000 mg | ORAL_TABLET | Freq: Four times a day (QID) | ORAL | Status: DC | PRN

## 2022-01-24 MED ORDER — SODIUM CHLORIDE 0.9 % IV SOLN
500.0000 mg | Freq: Once | INTRAVENOUS | Status: AC
  Administered 2022-01-24: 500 mg via INTRAVENOUS
  Filled 2022-01-24: qty 5

## 2022-01-24 MED ORDER — OXYCODONE HCL 5 MG PO TABS
2.5000 mg | ORAL_TABLET | ORAL | Status: DC | PRN
  Administered 2022-01-24: 2.5 mg via ORAL
  Filled 2022-01-24: qty 1

## 2022-01-24 MED ORDER — HYDRALAZINE HCL 20 MG/ML IJ SOLN: 5.0000 mg | INTRAMUSCULAR | Status: DC | PRN

## 2022-01-24 MED ORDER — SODIUM CHLORIDE 0.9 % IV SOLN
1.0000 g | Freq: Once | INTRAVENOUS | Status: DC
  Filled 2022-01-24: qty 10

## 2022-01-24 MED ORDER — NITROGLYCERIN 0.4 MG SL SUBL: 0.4000 mg | SUBLINGUAL_TABLET | SUBLINGUAL | Status: DC | PRN

## 2022-01-24 MED ORDER — SODIUM CHLORIDE 0.9 % IV SOLN
1.0000 g | INTRAVENOUS | Status: DC
  Administered 2022-01-25 – 2022-01-27 (×3): 1 g via INTRAVENOUS
  Filled 2022-01-24 (×4): qty 10

## 2022-01-24 MED ORDER — SODIUM CHLORIDE 0.9 % IV SOLN
1.0000 g | INTRAVENOUS | Status: DC
  Administered 2022-01-24: 1 g via INTRAVENOUS

## 2022-01-24 MED ORDER — ACETAMINOPHEN 325 MG RE SUPP
650.0000 mg | Freq: Four times a day (QID) | RECTAL | Status: DC | PRN
Start: 2022-01-24 — End: 2022-01-25

## 2022-01-24 MED ORDER — IPRATROPIUM-ALBUTEROL 0.5-2.5 (3) MG/3ML IN SOLN
3.0000 mL | RESPIRATORY_TRACT | Status: DC
  Administered 2022-01-24 – 2022-01-25 (×6): 3 mL via RESPIRATORY_TRACT
  Filled 2022-01-24 (×2): qty 3
  Filled 2022-01-24: qty 6
  Filled 2022-01-24 (×3): qty 3

## 2022-01-24 MED ORDER — AMLODIPINE BESYLATE 5 MG PO TABS
10.0000 mg | ORAL_TABLET | Freq: Every day | ORAL | Status: DC
  Filled 2022-01-24: qty 2

## 2022-01-24 MED ORDER — IPRATROPIUM-ALBUTEROL 0.5-2.5 (3) MG/3ML IN SOLN
3.0000 mL | Freq: Once | RESPIRATORY_TRACT | Status: AC
  Administered 2022-01-24: 3 mL via RESPIRATORY_TRACT
  Filled 2022-01-24: qty 3

## 2022-01-24 MED ORDER — METHYLPREDNISOLONE SODIUM SUCC 40 MG IJ SOLR
40.0000 mg | Freq: Two times a day (BID) | INTRAMUSCULAR | Status: DC
  Administered 2022-01-24 – 2022-01-27 (×6): 40 mg via INTRAVENOUS
  Filled 2022-01-24 (×6): qty 1

## 2022-01-24 MED ORDER — ONDANSETRON HCL 4 MG PO TABS: 4.0000 mg | ORAL_TABLET | Freq: Four times a day (QID) | ORAL | Status: DC | PRN

## 2022-01-24 MED ORDER — SODIUM CHLORIDE 0.9 % IV BOLUS
1000.0000 mL | Freq: Once | INTRAVENOUS | Status: AC
  Administered 2022-01-24: 1000 mL via INTRAVENOUS

## 2022-01-24 MED ORDER — MOMETASONE FURO-FORMOTEROL FUM 200-5 MCG/ACT IN AERO
2.0000 | INHALATION_SPRAY | Freq: Two times a day (BID) | RESPIRATORY_TRACT | Status: DC
  Filled 2022-01-24: qty 8.8

## 2022-01-24 MED ORDER — IOHEXOL 350 MG/ML SOLN
75.0000 mL | Freq: Once | INTRAVENOUS | Status: AC | PRN
  Administered 2022-01-24: 75 mL via INTRAVENOUS

## 2022-01-24 MED ORDER — DM-GUAIFENESIN ER 30-600 MG PO TB12: 1.0000 | ORAL_TABLET | Freq: Two times a day (BID) | ORAL | Status: DC | PRN

## 2022-01-24 MED ORDER — AZITHROMYCIN 250 MG PO TABS: 250.0000 mg | ORAL_TABLET | Freq: Every day | ORAL | Status: DC

## 2022-01-24 MED ORDER — ALBUTEROL SULFATE (2.5 MG/3ML) 0.083% IN NEBU: 2.5000 mg | INHALATION_SOLUTION | RESPIRATORY_TRACT | Status: DC | PRN

## 2022-01-24 MED ORDER — PANTOPRAZOLE SODIUM 40 MG PO TBEC
40.0000 mg | DELAYED_RELEASE_TABLET | Freq: Two times a day (BID) | ORAL | Status: DC
  Administered 2022-01-24: 40 mg via ORAL
  Filled 2022-01-24 (×2): qty 1

## 2022-01-24 MED ORDER — FUROSEMIDE 10 MG/ML IJ SOLN
20.0000 mg | Freq: Every day | INTRAMUSCULAR | Status: DC
  Administered 2022-01-24 – 2022-01-26 (×3): 20 mg via INTRAVENOUS
  Filled 2022-01-24 (×2): qty 4
  Filled 2022-01-24: qty 2

## 2022-01-24 MED ORDER — IPRATROPIUM-ALBUTEROL 0.5-2.5 (3) MG/3ML IN SOLN
3.0000 mL | Freq: Four times a day (QID) | RESPIRATORY_TRACT | Status: DC
Start: 2022-01-24 — End: 2022-01-24

## 2022-01-24 MED ORDER — ENSURE ENLIVE PO LIQD
237.0000 mL | Freq: Two times a day (BID) | ORAL | Status: DC
  Administered 2022-01-24: 237 mL via ORAL

## 2022-01-24 MED ORDER — CLOPIDOGREL BISULFATE 75 MG PO TABS
75.0000 mg | ORAL_TABLET | Freq: Every day | ORAL | Status: DC
  Filled 2022-01-24: qty 1

## 2022-01-24 MED ORDER — ALBUTEROL SULFATE (2.5 MG/3ML) 0.083% IN NEBU
2.5000 mg | INHALATION_SOLUTION | RESPIRATORY_TRACT | Status: DC | PRN
  Administered 2022-01-24: 2.5 mg via RESPIRATORY_TRACT
  Filled 2022-01-24 (×2): qty 3

## 2022-01-24 MED ORDER — ADULT MULTIVITAMIN W/MINERALS CH
1.0000 | ORAL_TABLET | Freq: Every day | ORAL | Status: DC
  Administered 2022-01-25: 1 via ORAL
  Filled 2022-01-24: qty 1

## 2022-01-24 MED ORDER — ONDANSETRON HCL 4 MG/2ML IJ SOLN
4.0000 mg | Freq: Four times a day (QID) | INTRAMUSCULAR | Status: DC | PRN
Start: 2022-01-24 — End: 2022-01-27
  Administered 2022-01-27: 4 mg via INTRAVENOUS
  Filled 2022-01-24: qty 2

## 2022-01-24 MED ORDER — ENOXAPARIN SODIUM 30 MG/0.3ML IJ SOSY: 30.0000 mg | PREFILLED_SYRINGE | INTRAMUSCULAR | Status: DC

## 2022-01-24 MED ORDER — FE FUMARATE-B12-VIT C-FA-IFC PO CAPS
1.0000 | ORAL_CAPSULE | Freq: Two times a day (BID) | ORAL | Status: DC
  Administered 2022-01-25: 1 via ORAL
  Filled 2022-01-24: qty 1

## 2022-01-24 MED ORDER — ATORVASTATIN CALCIUM 20 MG PO TABS
40.0000 mg | ORAL_TABLET | Freq: Every day | ORAL | Status: DC
  Administered 2022-01-24: 40 mg via ORAL
  Filled 2022-01-24: qty 2

## 2022-01-24 NOTE — Progress Notes (Signed)
*  PRELIMINARY RESULTS* Echocardiogram 2D Echocardiogram has been performed.  Olivia Lane 01/05/2022, 10:47 AM

## 2022-01-24 NOTE — ED Notes (Signed)
Received patient in room 16 on 3L of 02 via nasal cannula . No respiratory distress noted . Denies complaints at this time . Continuing monitoring.

## 2022-01-24 NOTE — Assessment & Plan Note (Addendum)
Sepsis due to COPD exacerbation: Patient admits criteria for sepsis with heart rate 94 and RR 25.  Lactic acid is 1.8.  Temperature normal, WBC 10.7.  Patient also has loculated pleural effusion, cannot completely rule out empyema.  - will admit to PCU as inpatient -Bronchodilators -Solu-Medrol 40 mg IV bid -IV antibiotics: Rocephin and azithromycin -Mucinex for cough  -Incentive spirometry -sputum culture -Nasal cannula oxygen as needed to maintain O2 saturation 93% or greater -Check procalcitonin level --> < 0.10 -IV fluid: Patient received 1 L of normal saline

## 2022-01-24 NOTE — ED Notes (Addendum)
Pt to ultrasound for Thoracentesis.

## 2022-01-24 NOTE — Assessment & Plan Note (Addendum)
BNP is elevated 912 and chest x-ray showed interstitial pulmonary edema, indicating possible acute CHF.  2D echo today showed EF 60 to 65%, with indeterminant diastolic dysfunction.  Clinically patient seems to have acute dCHF.  Per cardiology: S/p IV Lasix 20 mg once daily x3 doses, will increase this to 40 mg for this afternoon and tomorrow morning and monitor response.  Daily weights  strict I/O's  Low salt diet  Fluid restriction  Obtain REDs Vest reading

## 2022-01-24 NOTE — Assessment & Plan Note (Signed)
-  see above 

## 2022-01-24 NOTE — Assessment & Plan Note (Signed)
Lipitor 

## 2022-01-24 NOTE — Assessment & Plan Note (Addendum)
Patient reports was previously on Coumadin but does not want to be on blood thinners again, see A-fib/RVR

## 2022-01-24 NOTE — ED Triage Notes (Signed)
To room 16 via [facility] from home. C/o shortness of breath tonight. Hx of COPD. Per EMS 02 sat 88% on usual 3L [place] at home.

## 2022-01-24 NOTE — Procedures (Signed)
PROCEDURE SUMMARY:  Successful US guided diagnostic and therapeutic right thoracentesis. Yielded 900 mL of clear, yellow fluid. Pt tolerated procedure well. No immediate complications.  Specimen was sent for labs. CXR ordered.  EBL < 5 mL  Docia Barrier PA-C 01/12/2022 12:10 PM

## 2022-01-24 NOTE — Assessment & Plan Note (Signed)
Renal function close to baseline.  Creatinine 1.06, BUN 17  Follow-up with BMP

## 2022-01-24 NOTE — Progress Notes (Signed)
PHARMACIST - PHYSICIAN COMMUNICATION  CONCERNING:  Enoxaparin (Lovenox) for DVT Prophylaxis    RECOMMENDATION: Patient was prescribed enoxaprin '40mg'$  q24 hours for VTE prophylaxis.   Filed Weights   01/24/2022 0305  Weight: 47.6 kg (105 lb)    Body mass index is 19.2 kg/m.  Estimated Creatinine Clearance: 29.7 mL/min (A) (by C-G formula based on SCr of 1.06 mg/dL (H)).  Patient is candidate for enoxaparin '30mg'$  every 24 hours based on CrCl <35m/min or Weight <45kg  DESCRIPTION: [facility]  Patient is now receiving enoxaparin 30 mg every 24 hours   WDelena Bali PharmD PGY-1 Pharmacy Resident 01/16/2022 7:56 AM

## 2022-01-24 NOTE — Assessment & Plan Note (Addendum)
   on 3 L nasal cannula oxygen currently, saturation 95%

## 2022-01-24 NOTE — Assessment & Plan Note (Addendum)
Hemoglobin dropped slightly 8.7 --> 8.1 (8.9 on 01/07/2022)  Follow-up with CBC

## 2022-01-24 NOTE — Assessment & Plan Note (Signed)
Body weight 47.6 kg, BMI 19.20  Ensure  Nutrition consult

## 2022-01-24 NOTE — Assessment & Plan Note (Addendum)
BNP is elevated 912 and chest x-ray showed interstitial pulmonary edema, indicating possible acute CHF.  2D echo today showed EF 60 to 65%, with indeterminant diastolic dysfunction.  Clinically patient seems to have acute dCHF.  -will give low dose of lasix 20 mg daily -Daily weights -strict I/O's -Low salt diet -Fluid restriction -Obtain REDs Vest reading

## 2022-01-24 NOTE — ED Provider Notes (Signed)
[facility] Provider Note    Event Date/Time   First MD Initiated Contact with Patient 01/23/2022 [phone]     (approximate)   History   Shortness of Breath   HPI  Olivia Lane is a 86 y.o. female who presents to the ED for evaluation of Shortness of Breath   I reviewed medical DC summary from 7/9 where patient was admitted for left hip fracture in the setting of a mechanical fall. Otherwise history of COPD on chronic 3 L, CAD, HTN and paroxysmal A-fib on Eliquis.  Patient presents to the ED for the ration of dyspnea.  Reports feeling short of breath since she woke up early this morning.  Reports that her breathing was "kind of okay" when going to bed but much worse when she awakened.  EMS gave Solu-Medrol.  They found her at 87-88% on her chronic 3 L.  Physical Exam   Triage Vital Signs: ED Triage Vitals [01/02/2022 0305]  Enc Vitals Group     BP      Pulse      Resp      Temp      Temp src      SpO2      Weight 105 lb (47.6 kg)     Height '5\' 2"'$  (1.575 m)     Head Circumference      Peak Flow      Pain Score 5     Pain Loc      Pain Edu?      Excl. in [place]?     Most recent vital signs: Vitals:   01/11/2022 0306  BP: 137/62  Pulse: 94  Resp: (!) 25  Temp: 97.9 F (36.6 C)  SpO2: 95%    General: Awake  Sitting upright in bed, Pink puffer morphology, seems short of breath.  Obviously tachypneic and dyspneic. CV:  Good peripheral perfusion.  Resp:  Tachypneic to the mid 20s.  Diffuse expiratory wheezes.  Slight decreased airflow throughout. Abd:  No distention.  MSK:  No deformity noted.  Neuro:  No focal deficits appreciated. Other:     ED Results / Procedures / Treatments   Labs (all labs ordered are listed, but only abnormal results are displayed) Labs Reviewed  COMPREHENSIVE METABOLIC PANEL - Abnormal; Notable for the following components:      Result Value   Chloride 116 (*)    CO2 18 (*)    Glucose, Bld 104 (*)     Creatinine, Ser 1.06 (*)    Calcium 8.6 (*)    Total Protein 6.4 (*)    Albumin 2.8 (*)    GFR, Estimated 52 (*)    All other components within normal limits  CBC WITH DIFFERENTIAL/PLATELET - Abnormal; Notable for the following components:   WBC 10.7 (*)    RBC 3.21 (*)    Hemoglobin 8.7 (*)    HCT 28.8 (*)    RDW 21.1 (*)    Platelets 683 (*)    Abs Immature Granulocytes 0.11 (*)    All other components within normal limits  BRAIN NATRIURETIC PEPTIDE - Abnormal; Notable for the following components:   B Natriuretic Peptide 912.6 (*)    All other components within normal limits  SARS CORONAVIRUS 2 BY RT PCR  CULTURE, BLOOD (ROUTINE X 2)  CULTURE, BLOOD (ROUTINE X 2)  LACTIC ACID, PLASMA  URINALYSIS, ROUTINE W REFLEX MICROSCOPIC  PROCALCITONIN  LACTIC ACID, PLASMA  TROPONIN I (HIGH SENSITIVITY)  TROPONIN  I (HIGH SENSITIVITY)    EKG Atrial fibrillation with a rate of 94 bpm.  Normal axis and intervals.  Nonspecific ST changes inferiorly and laterally without STEMI.  RADIOLOGY 1 view CXR interpreted by me with bibasilar opacities and interstitial edema  Official radiology report(s): DG Chest Portable 1 View  Result Date: 01/07/2022 CLINICAL DATA:  Shortness of breath. EXAM: PORTABLE CHEST 1 VIEW COMPARISON:  January 02, 2022 FINDINGS: Multiple sternal wires are noted. The heart size and mediastinal contours are within normal limits. There is marked severity calcification of the aortic arch. Mild to moderate severity, diffusely increased interstitial lung markings are seen. Moderate to marked severity areas of atelectasis and/or infiltrate are seen within the bilateral lung bases. There are moderate sized bilateral pleural effusions. No pneumothorax is identified. The visualized skeletal structures are unremarkable. IMPRESSION: 1. Evidence of prior median sternotomy. 2. Mild to moderate severity interstitial edema. 3. Moderate to marked severity bibasilar atelectasis and/or infiltrate.  4. Moderate sized bilateral pleural effusions. Electronically Signed   By: Virgina Norfolk M.D.   On: 01/26/2022 03:45    PROCEDURES and INTERVENTIONS:  .1-3 Lead EKG Interpretation  Performed by: Vladimir Crofts, MD Authorized by: Vladimir Crofts, MD     Interpretation: normal     ECG rate:  80   ECG rate assessment: normal     Rhythm: atrial fibrillation     Ectopy: none     Conduction: normal     Medications  oxyCODONE (Oxy IR/ROXICODONE) immediate release tablet 2.5 mg (has no administration in time range)  methocarbamol (ROBAXIN) tablet 500 mg (has no administration in time range)  enoxaparin (LOVENOX) injection 30 mg (has no administration in time range)  acetaminophen (TYLENOL) tablet 650 mg (has no administration in time range)    Or  acetaminophen (TYLENOL) suppository 650 mg (has no administration in time range)  ondansetron (ZOFRAN) tablet 4 mg (has no administration in time range)    Or  ondansetron (ZOFRAN) injection 4 mg (has no administration in time range)  cefTRIAXone (ROCEPHIN) 1 g in sodium chloride 0.9 % 100 mL IVPB (0 g Intravenous Stopped 12/30/2021 0549)  ipratropium-albuterol (DUONEB) 0.5-2.5 (3) MG/3ML nebulizer solution 3 mL (has no administration in time range)  albuterol (PROVENTIL) (2.5 MG/3ML) 0.083% nebulizer solution 2.5 mg (has no administration in time range)  ipratropium-albuterol (DUONEB) 0.5-2.5 (3) MG/3ML nebulizer solution 3 mL (3 mLs Nebulization Given 01/08/2022 0313)  azithromycin (ZITHROMAX) 500 mg in sodium chloride 0.9 % 250 mL IVPB (500 mg Intravenous New Bag/Given 01/29/2022 0507)  sodium chloride 0.9 % bolus 1,000 mL (1,000 mLs Intravenous New Bag/Given 01/10/2022 0506)     IMPRESSION / MDM / [place] / ED COURSE  I reviewed the triage vital signs and the nursing notes.  Differential diagnosis includes, but is not limited to, ACS, pneumonia, COPD exacerbation, CHF exacerbation, sepsis, PTX  {Patient presents with symptoms of an acute  illness or injury that is potentially life-threatening.  86 year old female with history of COPD and chronic respiratory failure presents acutely short of breath with evidence of COPD exacerbation, possible CHF and possible CAP.  Requiring medical admission.  Not hypoxic, but is tachypneic without distress.  Improving clinical picture with breathing treatments.  Blood work with non-anion gap metabolic acidosis, minimal leukocytosis and not quite meeting sepsis criteria.  Chronic normocytic anemia.  Her x-ray demonstrates possible bibasilar infiltrates concerning for CAP.  Cultures drawn and will start CAP coverage.  We will consult medicine for admission considering continued symptoms  Clinical Course as of 01/23/2022 0655  Wed Jan 24, 2022  0431 reassessed [DS]    Clinical Course User Index [DS] Vladimir Crofts, MD     FINAL CLINICAL IMPRESSION(S) / ED DIAGNOSES   Final diagnoses:  COPD exacerbation ([place])  SOB (shortness of breath)  Community acquired pneumonia, unspecified laterality     Rx / DC Orders   ED Discharge Orders     None        Note:  This document was prepared using Dragon voice recognition software and may include unintentional dictation errors.   Vladimir Crofts, MD 01/08/2022 [phone]

## 2022-01-24 NOTE — Assessment & Plan Note (Addendum)
S/p of CABG. no chest pain.  Troponin level 13, 14.  Lipitor,  Discontinue clopidogrel 75 mg once daily and start aspirin 81 mg daily in anticipation of starting Eliquis for afib, to reduce the risk of bleeding.  As needed nitroglycerin

## 2022-01-24 NOTE — H&P (Signed)
History and Physical    Marieclaire Bettenhausen Gottlieb MPN:[id number] DOB: 1935-07-09 DOA: 01/02/2022  Referring MD/NP/PA:   PCP: Jonetta Osgood, NP   Patient coming from:  The patient is coming from home.    Chief Complaint: SOB  HPI: Dana Debo is a 86 y.o. female with medical history significant of COPD on 3 L oxygen, hypertension, hyperlipidemia, GERD, anemia, CAD, CABG, postoperative atrial fibrillation not on anticoagulants, CKD-CAD, who presents with shortness of breath.  Patient states that she started having shortness breath which has been worsening progressively since last night.  Patient has dry cough, no fever or chills.  Patient reports mild chest pain which is located in front of chest, pleuritic, aggravated by deep breath, sharp, nonradiating.  Denies nausea, vomiting, diarrhea or abdominal pain.  No symptoms of UTI.  Patient was recently hospitalized from 7/4 - 7/9 due to left femoral neck fracture.  Patient states that after she had left hip surgery, she was started on 3 L oxygen.  Before the surgery, she did not use oxygen at home.  Currently patient is taking Plavix.  She is not taking other anticoagulants.  Data reviewed independently and ED Course: pt was found to have WBC 10.7, lactic acid 1.8, troponin level 13, 14, BNP 912, negative COVID PCR, renal function close to baseline, temperature normal, blood pressure 137/62, heart rate 94, RR 25, oxygen saturation 95% on home level of 3 L oxygen.  Chest x-ray showed interstitial pulmonary edema and bilateral pleural effusion.  CTA negative for PE, but showed bilateral pleural effusion. LE venous Doppler negative for DVT.  Patient is admitted to progressive bed as inpatient.  CTA: 1. No evidence pulmonary embolus. 2. Moderate right and small left loculated pleural effusions, increased in size when compared to prior exam with new areas of loculation, concerning for empyemas. 3. Increased bilateral bronchial wall  thickening, findings can be seen in the setting of bronchitis. 4. Left upper lobe subpleural linear consolidation is unchanged when compared with prior exam and possibly due to scarring, follow-up chest CT in 3 months is recommended to ensure stability.   EKG: I have personally reviewed.  Sinus rhythm, QTc 384, frequent PAC, T wave inversion in V4-V6.   Review of Systems:   General: no fevers, chills, no body weight gain, has fatigue HEENT: no blurry vision, hearing changes or sore throat Respiratory: has dyspnea, coughing, wheezing CV: has chest pain, no palpitations GI: no nausea, vomiting, abdominal pain, diarrhea, constipation GU: no dysuria, burning on urination, increased urinary frequency, hematuria  Ext: has leg edema Neuro: no unilateral weakness, numbness, or tingling, no vision change or hearing loss Skin: no rash, no skin tear. MSK: No muscle spasm, no deformity, no limitation of range of movement in spin Heme: No easy bruising.  Travel history: No recent long distant travel.   Allergy: No Known Allergies  Past Medical History:  Diagnosis Date   Anemia    Gastro-esophageal reflux    Hyperlipidemia    Hypertension     Past Surgical History:  Procedure Laterality Date   APPENDECTOMY  2004   BYPASS GRAFT  2014   CATARACT EXTRACTION  2006,2004   COLONOSCOPY  2004   HIP ARTHROPLASTY Left 01/04/2022   Procedure: ARTHROPLASTY BIPOLAR HIP (HEMIARTHROPLASTY);  Surgeon: Hessie Knows, MD;  Location: [facility] ORS;  Service: Orthopedics;  Laterality: Left;    Social History:  reports that she quit smoking about 8 years ago. Her smoking use included cigarettes. She has a 40.00 pack-year smoking  history. She has never used smokeless tobacco. She reports that she does not drink alcohol and does not use drugs.  Family History:  Family History  Problem Relation Age of Onset   Hypertension Father    Coronary artery disease Sister    Hypertension Sister    Diabetes Brother     Hypertension Brother      Prior to Admission medications   Medication Sig Start Date End Date Taking? Authorizing Provider  albuterol (VENTOLIN HFA) 108 (90 Base) MCG/ACT inhaler Inhale 2 puffs into the lungs every 6 (six) hours as needed for wheezing or shortness of breath. 11/14/21  Yes Abernathy, Alyssa, NP  amLODipine (NORVASC) 10 MG tablet TAKE 1 TABLET BY MOUTH EVERY NIGHT AT BEDTIME FOR HYPERTENSION. 11/14/21   Jonetta Osgood, NP  atorvastatin (LIPITOR) 40 MG tablet Take 1 tablet (40 mg total) by mouth at bedtime. 11/14/21   Jonetta Osgood, NP  budesonide-formoterol (SYMBICORT) 160-4.5 MCG/ACT inhaler Inhale 2 puffs into the lungs 2 (two) times daily. Inhale 2 puffs into lungs 2 (two) times daily PLEASE USE NAME BRAND: SYMBICORT 05/22/21   Jonetta Osgood, NP  clopidogrel (PLAVIX) 75 MG tablet Take 1 tablet (75 mg total) by mouth daily. 11/14/21   Jonetta Osgood, NP  feeding supplement (ENSURE ENLIVE / ENSURE PLUS) LIQD Take 237 mLs by mouth 2 (two) times daily between meals. 01/07/22   Enzo Bi, MD  ferrous [id number] C-folic acid (TRINSICON / FOLTRIN) capsule Take 1 capsule by mouth 2 (two) times daily after a meal. 01/07/22   Enzo Bi, MD  methocarbamol (ROBAXIN) 500 MG tablet Take 1 tablet (500 mg total) by mouth every 6 (six) hours as needed for muscle spasms. 01/07/22   Enzo Bi, MD  metoprolol tartrate (LOPRESSOR) 50 MG tablet Take 1 tablet (50 mg total) by mouth every 12 (twelve) hours. 11/14/21   Jonetta Osgood, NP  Multiple Vitamin (MULTIVITAMIN WITH MINERALS) TABS tablet Take 1 tablet by mouth daily. 01/07/22   Enzo Bi, MD  nitroGLYCERIN (NITROSTAT) 0.4 MG SL tablet Place under the tongue. 12/02/13   [provider]  oxyCODONE (OXY IR/ROXICODONE) 5 MG immediate release tablet Take 0.5 tablets (2.5 mg total) by mouth every 4 (four) hours as needed for moderate pain. 01/05/22   Reche Dixon, PA-C  pantoprazole (PROTONIX) 40 MG tablet Take 1 tablet (40 mg total)  by mouth 2 (two) times daily. 11/14/21   Jonetta Osgood, NP  promethazine (PHENERGAN) 12.5 MG tablet Take 1 tablet (12.5 mg total) by mouth every 6 (six) hours as needed for nausea or vomiting. 04/06/21   Jonetta Osgood, NP  Vitamin D, Ergocalciferol, (DRISDOL) 1.25 MG (50000 UNIT) CAPS capsule Take 1 capsule (50,000 Units total) by mouth every 7 (seven) days. 11/14/21   Jonetta Osgood, NP    Physical Exam: Vitals:   01/03/2022 0900 01/04/2022 1102 01/16/2022 1207 01/15/2022 1339  BP: 115/64  (!) 120/53 (!) 116/52  Pulse: (!) 116  (!) 108 93  Resp: (!) 28   (!) 23  Temp:  97.9 F (36.6 C)    TempSrc:  Oral    SpO2: 97%  92% 97%  Weight:      Height:       General: Not in acute distress HEENT:       Eyes: PERRL, EOMI, no scleral icterus.       ENT: No discharge from the ears and nose, no pharynx injection, no tonsillar enlargement.        Neck: No JVD, no bruit, no  mass felt. Heme: No neck lymph node enlargement. Cardiac: S1/S2, RRR, No murmurs, No gallops or rubs. Respiratory: Has wheezing bilaterally GI: Soft, nondistended, nontender, no rebound pain, no organomegaly, BS present. GU: No hematuria Ext: 1+DP/PT pulse bilaterally.  Has asymmetric leg edema with trace right leg edema and 1+ left leg edema Musculoskeletal: No joint deformities, No joint redness or warmth, no limitation of ROM in spin. Skin: No rashes.  Neuro: Alert, oriented X3, cranial nerves II-XII grossly intact, moves all extremities normally. Psych: Patient is not psychotic, no suicidal or hemocidal ideation.  Labs on Admission: I have personally reviewed following labs and imaging studies  CBC: Recent Labs  Lab 01/08/2022 0313  WBC 10.7*  NEUTROABS 6.8  HGB 8.7*  HCT 28.8*  MCV 89.7  PLT 269*   Basic Metabolic Panel: Recent Labs  Lab 01/03/2022 0313  NA 142  K 4.1  CL 116*  CO2 18*  GLUCOSE 104*  BUN 17  CREATININE 1.06*  CALCIUM 8.6*  MG 2.3   GFR: Estimated Creatinine Clearance: 29.7 mL/min  (A) (by C-G formula based on SCr of 1.06 mg/dL (H)). Liver Function Tests: Recent Labs  Lab 01/23/2022 0313  AST 18  ALT 10  ALKPHOS 116  BILITOT 0.5  PROT 6.4*  ALBUMIN 2.8*   No results for input(s): "LIPASE", "AMYLASE" in the last 168 hours. No results for input(s): "AMMONIA" in the last 168 hours. Coagulation Profile: No results for input(s): "INR", "PROTIME" in the last 168 hours. Cardiac Enzymes: No results for input(s): "CKTOTAL", "CKMB", "CKMBINDEX", "TROPONINI" in the last 168 hours. BNP (last 3 results) No results for input(s): "PROBNP" in the last 8760 hours. HbA1C: No results for input(s): "HGBA1C" in the last 72 hours. CBG: No results for input(s): "GLUCAP" in the last 168 hours. Lipid Profile: No results for input(s): "CHOL", "HDL", "LDLCALC", "TRIG", "CHOLHDL", "LDLDIRECT" in the last 72 hours. Thyroid Function Tests: No results for input(s): "TSH", "T4TOTAL", "FREET4", "T3FREE", "THYROIDAB" in the last 72 hours. Anemia Panel: No results for input(s): "VITAMINB12", "FOLATE", "FERRITIN", "TIBC", "IRON", "RETICCTPCT" in the last 72 hours. Urine analysis:    Component Value Date/Time   COLORURINE YELLOW (A) 08/13/2018 1702   APPEARANCEUR Clear 03/23/2019 0957   LABSPEC >1.046 (H) 08/13/2018 1702   PHURINE 5.0 08/13/2018 1702   GLUCOSEU Negative 03/23/2019 0957   HGBUR SMALL (A) 08/13/2018 1702   BILIRUBINUR Negative 03/23/2019 0957   KETONESUR NEGATIVE 08/13/2018 1702   PROTEINUR Trace 03/23/2019 0957   PROTEINUR NEGATIVE 08/13/2018 1702   NITRITE Negative 03/23/2019 0957   NITRITE NEGATIVE 08/13/2018 1702   LEUKOCYTESUR Negative 03/23/2019 0957   LEUKOCYTESUR NEGATIVE 08/13/2018 1702   Sepsis Labs: '@LABRCNTIP'$ (procalcitonin:4,lacticidven:4) ) Recent Results (from the past 240 hour(s))  SARS Coronavirus 2 by RT PCR ([facility] order[facility]) *cepheid single result test* Anterior Nasal Swab     Status: None   Collection Time:  01/29/2022  3:17 AM   Specimen: Anterior Nasal Swab  Result Value Ref Range Status   SARS Coronavirus 2 by RT PCR NEGATIVE NEGATIVE Final    Comment: (NOTE) SARS-CoV-2 target nucleic acids are NOT DETECTED.  The SARS-CoV-2 RNA is generally detectable in upper and lower respiratory specimens during the acute phase of infection. The lowest concentration of SARS-CoV-2 viral copies this assay can detect is 250 copies / mL. A negative result does not preclude SARS-CoV-2 infection and should not be used as the sole basis for treatment or other patient management decisions.  A negative  result may occur with improper specimen collection / handling, submission of specimen other than nasopharyngeal swab, presence of viral mutation(s) within the areas targeted by this assay, and inadequate number of viral copies (<250 copies / mL). A negative result must be combined with clinical observations, patient history, and epidemiological information.  Fact Sheet for Patients:   https://www.patel.info/  Fact Sheet for Healthcare Providers: https://hall.com/  This test is not yet approved or  cleared by the [place] FDA and has been authorized for detection and/or diagnosis of SARS-CoV-2 by FDA under an Emergency Use Authorization (EUA).  This EUA will remain in effect (meaning this test can be used) for the duration of the COVID-19 declaration under Section 564(b)(1) of the Act, 21 U.S.C. section 360bbb-3(b)(1), unless the authorization is terminated or revoked sooner.  Performed at [facility], [address] [place], [place] [address]   Blood culture (routine x 2)     Status: None (Preliminary result)   Collection Time: 12/30/2021  4:54 AM   Specimen: BLOOD  Result Value Ref Range Status   Specimen Description BLOOD RIGHT [facility]  Final   Special Requests   Final    BOTTLES DRAWN AEROBIC AND ANAEROBIC Blood Culture adequate volume   Culture    Final    NO GROWTH < 12 HOURS Performed at [facility], [address], [place], [place] [address]    Report Status PENDING  Incomplete  Blood culture (routine x 2)     Status: None (Preliminary result)   Collection Time: 01/02/2022  4:54 AM   Specimen: BLOOD  Result Value Ref Range Status   Specimen Description BLOOD LEFT AC  Final   Special Requests   Final    BOTTLES DRAWN AEROBIC AND ANAEROBIC Blood Culture adequate volume   Culture   Final    NO GROWTH < 12 HOURS Performed at [facility], [address], [place], [place] [address]    Report Status PENDING  Incomplete     Radiological Exams on Admission: DG Chest Port 1 View  Result Date: 01/12/2022 CLINICAL DATA:  Status post right thoracentesis. EXAM: PORTABLE CHEST 1 VIEW COMPARISON:  Same day. FINDINGS: No pneumothorax is noted status post right thoracentesis. Right pleural effusion is significantly smaller. IMPRESSION: No pneumothorax status post right thoracentesis. Electronically Signed   By: Marijo Conception M.D.   On: 01/05/2022 12:52   US THORACENTESIS ASP PLEURAL SPACE W/IMG GUIDE  Result Date: 01/05/2022 INDICATION: Patient with history of COPD, shortness of breath, right pleural effusion. Request is made for diagnostic and therapeutic right thoracentesis. EXAM: ULTRASOUND GUIDED DIAGNOSTIC AND THERAPEUTIC RIGHT THORACENTESIS MEDICATIONS: 10 mL 1% lidocaine COMPLICATIONS: None immediate. PROCEDURE: An ultrasound guided thoracentesis was thoroughly discussed with the patient and questions answered. The benefits, risks, alternatives and complications were also discussed. The patient understands and wishes to proceed with the procedure. Written consent was obtained. Ultrasound was performed to localize and mark an adequate pocket of fluid in the right chest. The area was then prepped and draped in the normal sterile fashion. 1% Lidocaine was used for local anesthesia. Under ultrasound guidance a 6 Fr  Safe-T-Centesis catheter was introduced. Thoracentesis was performed. The catheter was removed and a dressing applied. FINDINGS: A total of approximately 900 mL of clear, yellow fluid was removed. Samples were sent to the laboratory as requested by the clinical team. IMPRESSION: Successful ultrasound guided diagnostic and therapeutic right thoracentesis yielding 900 mL of pleural fluid. Read by: Brynda Greathouse PA-C Electronically  Signed   By: Jerilynn Mages.  Shick M.D.   On: 12/31/2021 12:17   ECHOCARDIOGRAM COMPLETE  Result Date: 01/21/2022    ECHOCARDIOGRAM REPORT   Patient Name:   BRINLYN CENA [facility] Date of Exam: 01/20/2022 Medical Rec #:  [id number]              Height:       62.0 in Accession #:    [id number]             Weight:       105.0 lb Date of Birth:  April 19, 1936               BSA:          1.454 m Patient Age:    71 years               BP:           102/81 mmHg Patient Gender: F                      HR:           114 bpm. Exam Location:  [facility] Procedure: 2D Echo, Color Doppler and Cardiac Doppler Indications:     I50.31 congestive heart failure-Acute Diastolic  History:         Patient has prior history of Echocardiogram examinations. CAD;                  Risk Factors:Hypertension and Dyslipidemia.  Sonographer:     Charmayne Sheer Referring Phys:  Unknown Foley Kyrianna Barletta Diagnosing Phys: Neoma Laming  Sonographer Comments: Suboptimal parasternal window. IMPRESSIONS  1. Left ventricular ejection fraction, by estimation, is 60 to 65%. The left ventricle has normal function. The left ventricle has no regional wall motion abnormalities. Left ventricular diastolic parameters are indeterminate.  2. Right ventricular systolic function is normal. The right ventricular size is moderately enlarged.  3. Left atrial size was moderately dilated.  4. Right atrial size was moderately dilated.  5. The mitral valve is normal in structure. Mild to moderate mitral valve regurgitation. No evidence of mitral stenosis.  6. The aortic valve is  normal in structure. Aortic valve regurgitation is mild to moderate. Aortic valve sclerosis/calcification is present, without any evidence of aortic stenosis.  7. The inferior vena cava is normal in size with greater than 50% respiratory variability, suggesting right atrial pressure of 3 mmHg. FINDINGS  Left Ventricle: Left ventricular ejection fraction, by estimation, is 60 to 65%. The left ventricle has normal function. The left ventricle has no regional wall motion abnormalities. The left ventricular internal cavity size was normal in size. There is  no left ventricular hypertrophy. Left ventricular diastolic parameters are indeterminate. Right Ventricle: The right ventricular size is moderately enlarged. No increase in right ventricular wall thickness. Right ventricular systolic function is normal. Left Atrium: Left atrial size was moderately dilated. Right Atrium: Right atrial size was moderately dilated. Pericardium: There is no evidence of pericardial effusion. Mitral Valve: The mitral valve is normal in structure. Mild to moderate mitral valve regurgitation. No evidence of mitral valve stenosis. Tricuspid Valve: The tricuspid valve is normal in structure. Tricuspid valve regurgitation is mild . No evidence of tricuspid stenosis. Aortic Valve: The aortic valve is normal in structure. Aortic valve regurgitation is mild to moderate. Aortic valve sclerosis/calcification is present, without any evidence of aortic stenosis. Aortic valve mean gradient measures 9.0 mmHg. Aortic valve peak gradient measures 15.7 mmHg. Aortic valve area, by  VTI measures 1.37 cm. Pulmonic Valve: The pulmonic valve was normal in structure. Pulmonic valve regurgitation is not visualized. No evidence of pulmonic stenosis. Aorta: The aortic root is normal in size and structure. Venous: The inferior vena cava is normal in size with greater than 50% respiratory variability, suggesting right atrial pressure of 3 mmHg. IAS/Shunts: No atrial  level shunt detected by color flow Doppler.  LEFT VENTRICLE PLAX 2D LVIDd:         3.37 cm   Diastology LVIDs:         2.28 cm   LV e' medial:    4.57 cm/s LV PW:         0.78 cm   LV E/e' medial:  31.6 LV IVS:        0.70 cm   LV e' lateral:   6.20 cm/s LVOT diam:     1.80 cm   LV E/e' lateral: 23.3 LV SV:         53 LV SV Index:   37 LVOT Area:     2.54 cm  RIGHT VENTRICLE RV Basal diam:  3.32 cm LEFT ATRIUM             Index        RIGHT ATRIUM           Index LA diam:        3.80 cm 2.61 cm/m   RA Area:     12.20 cm LA Vol (A2C):   30.4 ml 20.91 ml/m  RA Volume:   26.90 ml  18.50 ml/m LA Vol (A4C):   55.0 ml 37.83 ml/m LA Biplane Vol: 42.1 ml 28.96 ml/m  AORTIC VALVE AV Area (Vmax):    1.48 cm AV Area (Vmean):   1.41 cm AV Area (VTI):     1.37 cm AV Vmax:           198.00 cm/s AV Vmean:          144.000 cm/s AV VTI:            0.390 m AV Peak Grad:      15.7 mmHg AV Mean Grad:      9.0 mmHg LVOT Vmax:         115.00 cm/s LVOT Vmean:        80.000 cm/s LVOT VTI:          0.210 m LVOT/AV VTI ratio: 0.54  AORTA Ao Root diam: 2.70 cm MITRAL VALVE                TRICUSPID VALVE MV Area (PHT): 3.65 cm     TR Peak grad:   40.7 mmHg MV Decel Time: 208 msec     TR Vmax:        319.00 cm/s MV E velocity: 144.33 cm/s                             SHUNTS                             Systemic VTI:  0.21 m                             Systemic Diam: 1.80 cm Neoma Laming Electronically signed by Neoma Laming Signature Date/Time: 01/18/2022/12:12:10 PM    Final    US Venous Img Lower Bilateral (  DVT)  Result Date: 01/23/2022 CLINICAL DATA:  Bilateral lower extremity edema.  Evaluate for DVT. EXAM: BILATERAL LOWER EXTREMITY VENOUS DOPPLER ULTRASOUND TECHNIQUE: Gray-scale sonography with graded compression, as well as color Doppler and duplex ultrasound were performed to evaluate the lower extremity deep venous systems from the level of the common femoral vein and including the common femoral, femoral, profunda femoral,  popliteal and calf veins including the posterior tibial, peroneal and gastrocnemius veins when visible. The superficial great saphenous vein was also interrogated. Spectral Doppler was utilized to evaluate flow at rest and with distal augmentation maneuvers in the common femoral, femoral and popliteal veins. COMPARISON:  Left lower extremity venous Doppler ultrasound-12/17/2007 (negative). FINDINGS: RIGHT LOWER EXTREMITY Common Femoral Vein: No evidence of thrombus. Normal compressibility, respiratory phasicity and response to augmentation. Saphenofemoral Junction: No evidence of thrombus. Normal compressibility and flow on color Doppler imaging. Profunda Femoral Vein: No evidence of thrombus. Normal compressibility and flow on color Doppler imaging. Femoral Vein: No evidence of thrombus. Normal compressibility, respiratory phasicity and response to augmentation. Popliteal Vein: No evidence of thrombus. Normal compressibility, respiratory phasicity and response to augmentation. Calf Veins: No evidence of thrombus. Normal compressibility and flow on color Doppler imaging. Superficial Great Saphenous Vein: No evidence of thrombus. Normal compressibility. Other Findings:  None. LEFT LOWER EXTREMITY Common Femoral Vein: No evidence of thrombus. Normal compressibility, respiratory phasicity and response to augmentation. Saphenofemoral Junction: No evidence of thrombus. Normal compressibility and flow on color Doppler imaging. Profunda Femoral Vein: No evidence of thrombus. Normal compressibility and flow on color Doppler imaging. Femoral Vein: No evidence of thrombus. Normal compressibility, respiratory phasicity and response to augmentation. Popliteal Vein: No evidence of thrombus. Normal compressibility, respiratory phasicity and response to augmentation. Calf Veins: No evidence of thrombus. Normal compressibility and flow on color Doppler imaging. Superficial Great Saphenous Vein: No evidence of thrombus. Normal  compressibility. Other Findings:  None. IMPRESSION: No evidence of DVT within either lower extremity. Electronically Signed   By: Sandi Mariscal M.D.   On: 01/15/2022 10:01   CT Angio Chest Pulmonary Embolism (PE) W or WO Contrast  Result Date: 01/25/2022 CLINICAL DATA:  Shortness of breath EXAM: CT ANGIOGRAPHY CHEST WITH CONTRAST TECHNIQUE: Multidetector CT imaging of the chest was performed using the standard protocol during bolus administration of intravenous contrast. Multiplanar CT image reconstructions and MIPs were obtained to evaluate the vascular anatomy. RADIATION DOSE REDUCTION: This exam was performed according to the departmental dose-optimization program which includes automated exposure control, adjustment of the mA and/or kV according to patient size and/or use of iterative reconstruction technique. CONTRAST:  67m OMNIPAQUE IOHEXOL 350 MG/ML SOLN COMPARISON:  Chest CT dated January 03, 2022 FINDINGS: Cardiovascular: No evidence of pulmonary embolus. Mild cardiomegaly. No pericardial effusion. Normal caliber thoracic aorta with severe atherosclerotic disease. Three-vessel coronary artery calcifications status post CABG. Mediastinum/Nodes: Mildly enlarged subcarinal lymph node, measuring up to 1.3 cm, unchanged in size when compared with prior exam, other subcentimeter mediastinal and hilar lymph nodes are seen which are unchanged in size. Patulous esophagus. Thyroid is unremarkable. Lungs/Pleura: Central airways are patent. Bilateral bronchial wall thickening. Moderate severe centrilobular emphysema. Moderate right and small left loculated pleural effusions, increased in size when compared with prior exam. No evidence of pleural thickening, however contrast timing limits evaluation. Left upper lobe subpleural linear consolidation is unchanged when compared with prior. Upper Abdomen: Partially visualized simple appearing left renal cysts, no further follow-up imaging is recommended for this lesion. No  acute abnormality. Musculoskeletal: Prior median sternotomy.  Severe compression deformity of T10, unchanged when compared to prior exam. No aggressive appearing osseous lesions. Review of the MIP images confirms the above findings. IMPRESSION: 1. No evidence pulmonary embolus. 2. Moderate right and small left loculated pleural effusions, increased in size when compared to prior exam with new areas of loculation, concerning for empyemas. 3. Increased bilateral bronchial wall thickening, findings can be seen in the setting of bronchitis. 4. Left upper lobe subpleural linear consolidation is unchanged when compared with prior exam and possibly due to scarring, follow-up chest CT in 3 months is recommended to ensure stability. 5. Severe aortic Atherosclerosis (ICD10-I70.0) and Emphysema (ICD10-J43.9). Electronically Signed   By: Yetta Glassman M.D.   On: 01/18/2022 09:42   DG Chest Portable 1 View  Result Date: 01/14/2022 CLINICAL DATA:  Shortness of breath. EXAM: PORTABLE CHEST 1 VIEW COMPARISON:  January 02, 2022 FINDINGS: Multiple sternal wires are noted. The heart size and mediastinal contours are within normal limits. There is marked severity calcification of the aortic arch. Mild to moderate severity, diffusely increased interstitial lung markings are seen. Moderate to marked severity areas of atelectasis and/or infiltrate are seen within the bilateral lung bases. There are moderate sized bilateral pleural effusions. No pneumothorax is identified. The visualized skeletal structures are unremarkable. IMPRESSION: 1. Evidence of prior median sternotomy. 2. Mild to moderate severity interstitial edema. 3. Moderate to marked severity bibasilar atelectasis and/or infiltrate. 4. Moderate sized bilateral pleural effusions. Electronically Signed   By: Virgina Norfolk M.D.   On: 01/26/2022 03:45      Assessment/Plan Principal Problem:   COPD exacerbation (HCC) Active Problems:   Sepsis ([place])   Acute diastolic  congestive heart failure (HCC)   Chronic respiratory failure with hypoxia (HCC)   Coronary artery disease   Essential hypertension   Dyslipidemia   Postoperative atrial fibrillation (HCC)   Stage 3b chronic kidney disease (CKD) (HCC)   Iron deficiency anemia   Protein-calorie malnutrition, moderate (HCC)   Pleural effusion   Assessment and Plan: * COPD exacerbation ([place]) Sepsis due to COPD exacerbation: Patient admits criteria for sepsis with heart rate 94 and RR 25.  Lactic acid is 1.8.  Temperature normal, WBC 10.7.  Patient also has loculated pleural effusion, cannot completely rule out empyema.  - will admit to PCU as inpatient -Bronchodilators -Solu-Medrol 40 mg IV bid -IV antibiotics: Rocephin and azithromycin -Mucinex for cough  -Incentive spirometry -sputum culture -Nasal cannula oxygen as needed to maintain O2 saturation 93% or greater -Check procalcitonin level --> < 0.10 -IV fluid: Patient received 1 L of normal saline   Sepsis ([place]) -see above  Acute diastolic congestive heart failure (HCC) BNP is elevated 912 and chest x-ray showed interstitial pulmonary edema, indicating possible acute CHF.  2D echo today showed EF 60 to 65%, with indeterminant diastolic dysfunction.  Clinically patient seems to have acute dCHF.  -will give low dose of lasix 20 mg daily -Daily weights -strict I/O's -Low salt diet -Fluid restriction -Obtain REDs Vest reading   Chronic respiratory failure with hypoxia (HCC) -on 3 L nasal cannula oxygen currently, saturation 95%  Coronary artery disease S/p of CABG. no chest pain.  Troponin level 13, 14. -Lipitor, -Plavix -As needed nitroglycerin  Essential hypertension - IV hydralazine as needed -Metoprolol, amlodipine  Dyslipidemia - Lipitor  Postoperative atrial fibrillation (HCC) Currently patient has a sinus rhythm, with frequent PAC.  Heart rate 80-90s.  Patient's not taking anticoagulants. -Telemetry monitoring -Continue  metoprolol  Stage 3b chronic kidney disease (CKD) (  [place]) Renal function close to baseline.  Creatinine 1.06, BUN 17 -Follow-up with BMP  Iron deficiency anemia  Hemoglobin stable 8.7 (8.9 on 01/07/2022) -Follow-up with CBC  Pleural effusion Thoracentesis was performed by IR, 900 of fluid was removed from right side -f/u fluid culture and analysis  Protein-calorie malnutrition, moderate (HCC) Body weight 47.6 kg, BMI 19.20 - Ensure -Nutrition consult          DVT ppx: SQ Lovenox  Code Status: Full code  Family Communication:    Yes, patient's son and sister at bed side.    Disposition Plan:  Anticipate discharge back to previous environment  Consults called:  none  Admission status and Level of care: Progressive:   as inpt       Severity of Illness:  The appropriate patient status for this patient is OBSERVATION. Observation status is judged to be reasonable and necessary in order to provide the required intensity of service to ensure the patient's safety. [name] presenting symptoms, physical exam findings, and initial radiographic and laboratory data in the context of their medical condition is felt to place them at decreased risk for further clinical deterioration. Furthermore, it is anticipated that the patient will be medically stable for discharge from the hospital within 2 midnights of admission.        Date of Service 01/12/2022    Ivor Costa [facility]   If 7PM-7AM, please contact night-coverage [email or web address] 01/12/2022, 2:25 PM

## 2022-01-24 NOTE — Assessment & Plan Note (Signed)
   IV hydralazine as needed  Metoprolol, amlodipine

## 2022-01-24 NOTE — Assessment & Plan Note (Signed)
Thoracentesis was performed by IR, 900 of fluid was removed from right side  f/u fluid culture and analysis  Thus far indicates transudate

## 2022-01-25 ENCOUNTER — Inpatient Hospital Stay: Payer: Medicare Other

## 2022-01-25 DIAGNOSIS — I4891 Unspecified atrial fibrillation: Secondary | ICD-10-CM

## 2022-01-25 DIAGNOSIS — J441 Chronic obstructive pulmonary disease with (acute) exacerbation: Secondary | ICD-10-CM | POA: Diagnosis not present

## 2022-01-25 LAB — LACTATE DEHYDROGENASE: LDH: 121 U/L (ref 98–192)

## 2022-01-25 LAB — PROTEIN, BODY FLUID (OTHER): Total Protein, Body Fluid Other: 1.1 g/dL

## 2022-01-25 LAB — BASIC METABOLIC PANEL
Anion gap: 5 (ref 5–15)
BUN: 20 mg/dL (ref 8–23)
CO2: 20 mmol/L — ABNORMAL LOW (ref 22–32)
Calcium: 8.4 mg/dL — ABNORMAL LOW (ref 8.9–10.3)
Chloride: 116 mmol/L — ABNORMAL HIGH (ref 98–111)
Creatinine, Ser: 1.23 mg/dL — ABNORMAL HIGH (ref 0.44–1.00)
GFR, Estimated: 43 mL/min — ABNORMAL LOW (ref >60)
Glucose, Bld: 146 mg/dL — ABNORMAL HIGH (ref 70–99)
Potassium: 4.4 mmol/L (ref 3.5–5.1)
Sodium: 141 mmol/L (ref 135–145)

## 2022-01-25 LAB — PROTIME-INR
INR: 1.3 — ABNORMAL HIGH (ref 0.8–1.2)
Prothrombin Time: 16.1 seconds — ABNORMAL HIGH (ref 11.4–15.2)

## 2022-01-25 LAB — CBC
HCT: 24.9 % — ABNORMAL LOW (ref 36.0–46.0)
Hemoglobin: 7.7 g/dL — ABNORMAL LOW (ref 12.0–15.0)
MCH: 27.5 pg (ref 26.0–34.0)
MCHC: 30.9 g/dL (ref 30.0–36.0)
MCV: 88.9 fL (ref 80.0–100.0)
Platelets: 571 10*3/uL — ABNORMAL HIGH (ref 150–400)
RBC: 2.8 MIL/uL — ABNORMAL LOW (ref 3.87–5.11)
RDW: 21.1 % — ABNORMAL HIGH (ref 11.5–15.5)
WBC: 18.2 10*3/uL — ABNORMAL HIGH (ref 4.0–10.5)
nRBC: 0 % (ref 0.0–0.2)

## 2022-01-25 MED ORDER — METOPROLOL TARTRATE 5 MG/5ML IV SOLN
5.0000 mg | Freq: Once | INTRAVENOUS | Status: AC
  Administered 2022-01-25: 5 mg via INTRAVENOUS
  Filled 2022-01-25: qty 5

## 2022-01-25 MED ORDER — MOMETASONE FURO-FORMOTEROL FUM 200-5 MCG/ACT IN AERO
2.0000 | INHALATION_SPRAY | Freq: Two times a day (BID) | RESPIRATORY_TRACT | Status: DC
  Filled 2022-01-25: qty 8.8

## 2022-01-25 MED ORDER — FE FUMARATE-B12-VIT C-FA-IFC PO CAPS
1.0000 | ORAL_CAPSULE | Freq: Two times a day (BID) | ORAL | Status: DC
  Administered 2022-01-25 – 2022-01-26 (×2): 1 via ORAL
  Filled 2022-01-25 (×5): qty 1

## 2022-01-25 MED ORDER — IPRATROPIUM-ALBUTEROL 0.5-2.5 (3) MG/3ML IN SOLN
3.0000 mL | Freq: Four times a day (QID) | RESPIRATORY_TRACT | Status: DC
  Administered 2022-01-25 – 2022-01-26 (×4): 3 mL via RESPIRATORY_TRACT
  Filled 2022-01-25 (×4): qty 3

## 2022-01-25 MED ORDER — ENSURE ENLIVE PO LIQD: 237.0000 mL | Freq: Two times a day (BID) | ORAL | Status: DC

## 2022-01-25 MED ORDER — METOPROLOL TARTRATE 50 MG PO TABS
100.0000 mg | ORAL_TABLET | Freq: Two times a day (BID) | ORAL | Status: DC
  Administered 2022-01-25: 100 mg via ORAL
  Filled 2022-01-25 (×2): qty 2

## 2022-01-25 MED ORDER — PROMETHAZINE HCL 25 MG PO TABS: 12.5000 mg | ORAL_TABLET | Freq: Four times a day (QID) | ORAL | Status: DC | PRN

## 2022-01-25 MED ORDER — DILTIAZEM HCL 25 MG/5ML IV SOLN
10.0000 mg | Freq: Once | INTRAVENOUS | Status: AC
  Administered 2022-01-25: 10 mg via INTRAVENOUS
  Filled 2022-01-25: qty 5

## 2022-01-25 MED ORDER — PANTOPRAZOLE SODIUM 40 MG PO TBEC
40.0000 mg | DELAYED_RELEASE_TABLET | Freq: Two times a day (BID) | ORAL | Status: DC
  Administered 2022-01-25 – 2022-01-26 (×2): 40 mg via ORAL
  Filled 2022-01-25 (×3): qty 1

## 2022-01-25 MED ORDER — ATORVASTATIN CALCIUM 20 MG PO TABS
40.0000 mg | ORAL_TABLET | Freq: Every day | ORAL | Status: DC
  Administered 2022-01-25 – 2022-01-26 (×2): 40 mg via ORAL
  Filled 2022-01-25 (×2): qty 2

## 2022-01-25 MED ORDER — PANTOPRAZOLE SODIUM 40 MG IV SOLR
40.0000 mg | INTRAVENOUS | Status: DC
  Administered 2022-01-25: 40 mg via INTRAVENOUS
  Filled 2022-01-25: qty 10

## 2022-01-25 MED ORDER — METOPROLOL TARTRATE 5 MG/5ML IV SOLN: 5.0000 mg | Freq: Four times a day (QID) | INTRAVENOUS | Status: DC

## 2022-01-25 MED ORDER — CLOPIDOGREL BISULFATE 75 MG PO TABS
75.0000 mg | ORAL_TABLET | Freq: Every day | ORAL | Status: DC
  Administered 2022-01-25: 75 mg via ORAL
  Filled 2022-01-25 (×2): qty 1

## 2022-01-25 MED ORDER — METOPROLOL TARTRATE 50 MG PO TABS
100.0000 mg | ORAL_TABLET | Freq: Two times a day (BID) | ORAL | Status: DC
  Filled 2022-01-25: qty 2

## 2022-01-25 MED ORDER — METOPROLOL TARTRATE 5 MG/5ML IV SOLN
5.0000 mg | Freq: Once | INTRAVENOUS | Status: AC
Start: 2022-01-25 — End: 2022-01-25
  Administered 2022-01-25: 5 mg via INTRAVENOUS
  Filled 2022-01-25: qty 5

## 2022-01-25 MED ORDER — DILTIAZEM HCL-DEXTROSE 125-5 MG/125ML-% IV SOLN (PREMIX)
5.0000 mg/h | INTRAVENOUS | Status: DC
  Administered 2022-01-25 (×2): 5 mg/h via INTRAVENOUS
  Administered 2022-01-26: 15 mg/h via INTRAVENOUS
  Administered 2022-01-26: 5 mg/h via INTRAVENOUS
  Administered 2022-01-27 (×2): 15 mg/h via INTRAVENOUS
  Filled 2022-01-25 (×5): qty 125

## 2022-01-25 MED ORDER — OXYCODONE HCL 5 MG PO TABS: 2.5000 mg | ORAL_TABLET | ORAL | Status: DC | PRN

## 2022-01-25 MED ORDER — ADULT MULTIVITAMIN W/MINERALS CH: 1.0000 | ORAL_TABLET | Freq: Every day | ORAL | Status: DC

## 2022-01-25 NOTE — Progress Notes (Signed)
SLP Follow up Note  Patient Details Name: Olivia Lane MRN: [id number] DOB: 1936/01/31   SLP aware of new orders for a bedside swallow evaluation. Further information obtained from pt's nurse who reports that pt was taking a multi-vitamin and began coughing/choking. Pt stated that she takes some medicines in half and was working her way to taking them whole with thin liquids d/t hx of esophageal issues (previous dilation?) Per note from Dr Allen Norris dated 08/27/2016 - pt had an upper endoscopy in July 2015 with finding of hiatal hernia and an upper GI (07/16/2016) revealed Moderate narrowing of the distal esophagus at the level just above the hiatal hernia. This is consistent with a stricture. There is a moderate delay of passage of a standardized barium tablet. Per Dr Dorothey Baseman note, it appears this wasn't treated as pt was not symptomatic.   Given pt's current symptoms of globus sensation and no history of respiratory related decline signaling aspiration, recommend possible barium swallow and consult to GI as the above incident doesn't appear related to any global oropharyngeal dysphagia.   Secure chat sent to pt's attending, Dr Emeterio Reeve, who stated she will order a barium swallow.   ST to follow briefly for results of the barium swallow.   Shemeca Lukasik B. Rutherford Nail, M.S., CCC-SLP, Mining engineer Certified Brain Injury [place]  [place] [facility] [phone] Ascom [phone] Fax [phone]

## 2022-01-25 NOTE — Progress Notes (Addendum)
       CROSS COVER NOTE  NAME: Olivia Lane MRN: [id number] DOB : [birth year]    Date of Service   01/25/22  HPI/Events of Note   Secure chat received from nursing reporting HR 110s-130s.   On arrival to bedside [name] is sleeping and easily arousable. Her only complaint is being cold. Denies palpitations, change in dyspnea, dizziness, or fatigue. Endorses unchanged pleuritic chest pain present on ED arrival. She has a history of atrial fibrillation not on home anticoagulation.  Interventions   Plan: EKG Valsalva IV Metoprolol x2      This document was prepared using Dragon voice recognition software and may include unintentional dictation errors.  Neomia Glass DNP, MHA, FNP-BC Nurse Practitioner [facility] Pager [phone]

## 2022-01-25 NOTE — Progress Notes (Signed)
PROGRESS NOTE    Olivia Lane  VOH:[id number] DOB: May 16, 1936  DOA: 01/17/2022 Date of Service: 01/25/22 PCP: Jonetta Osgood, NP     Brief Narrative / Hospital Course:  Olivia Lane is a 86 y.o. female with medical history significant of COPD on 3 L oxygen, hypertension, hyperlipidemia, GERD, anemia, CAD, CABG, postoperative atrial fibrillation not on anticoagulants, CKD-CAD, who presents to ED 01/13/2022 with shortness of breath. 07/26: Admitted for COPD exacerbation. (+)SIRS w/ HR>90, RR>20 but was afebrile and no leukocytosis. Lactic acid 1.8. Loculated pleural effusion, cannot r/o empyema so possible sepsis pending thoracentesis. Treating for CAP w/ azithromycin and ceftriaxone. Thoracentesis yielded likely transudate effusion based on Light's criteria  07/27: WBC increased likely d/t steroids. AFib RVR not responding to IV pushes metoprolol but converted to Afib reg rate w/ cardizem gtt. RN had concerns for choking episode, SLP evaluated and recommended barium swallow study which showed no concerns, po meds restarted. Has asymmetric leg edema with trace right leg edema and 1+ left leg edema, small area of erythema on 3rd toe.    Consultants:  none  Procedures: none    Subjective: Patient reports no concerns, stable SOB without chest pain. COncern for RLE swelling without pain.      ASSESSMENT & PLAN:   Principal Problem:   COPD exacerbation ([place]) Active Problems:   Acute diastolic congestive heart failure (HCC)   Chronic respiratory failure with hypoxia (HCC)   Coronary artery disease   Essential hypertension   Dyslipidemia   Postoperative atrial fibrillation (HCC)   Stage 3b chronic kidney disease (CKD) (HCC)   Iron deficiency anemia   Protein-calorie malnutrition, moderate (HCC)   Pleural effusion   Atrial fibrillation with RVR (HCC)   COPD exacerbation (HCC) SIRS due to COPD exacerbation Possible sepsis d/t CAP Patient admits criteria  for SIRS/sepsis with heart rate 94 and RR 25.  Lactic acid is 1.8.  Temperature normal, WBC 10.7.   Patient also has loculated pleural effusion, cannot completely rule out empyema though appears to be transudate and procalcitonin not indicative of respiratory bacterial infection  will admit to PCU as inpatient Bronchodilators Solu-Medrol 40 mg IV bid IV antibiotics: Rocephin and azithromycin Mucinex for cough  Incentive spirometry sputum culture Nasal cannula oxygen as needed to maintain O2 saturation 93% or greater Check procalcitonin level --> < 0.10 IV fluid: Patient received 1 L of normal saline  Acute diastolic congestive heart failure (HCC) BNP is elevated 912 and chest x-ray showed interstitial pulmonary edema, indicating possible acute CHF.  2D echo today showed EF 60 to 65%, with indeterminant diastolic dysfunction.  Clinically patient seems to have acute dCHF. will give low dose of lasix 20 mg daily Daily weights strict I/O's Low salt diet Fluid restriction Obtain REDs Vest reading  Atrial fibrillation with RVR ([place]) On admission, patient has a sinus rhythm, with frequent PAC.  Heart rate 80-90s.   Patient's not taking anticoagulants outpatient and declines anticoagulation at this time despite education on La[place] 7 however this possibly overestimates risk given that she went into A-fib today and converted back to sinus rhythm will leave on telemetry if she is going into A-fib frequently may need to revisit conversation about anticoagulation Trial tapering off diltiazem drip. restart po meds at higher dose beta blocker   Pleural effusion Thoracentesis was performed by IR, 900 of fluid was removed from right side f/u fluid culture and analysis Thus far indicates transudate  Chronic respiratory failure with hypoxia ([place]) on 3 L nasal  cannula oxygen currently, saturation 95%  Coronary artery disease S/p CABG. no chest pain.   Troponin level 13,  14. Lipitor, Plavix As needed nitroglycerin  Essential hypertension IV hydralazine as needed Metoprolol, amlodipine  Dyslipidemia Lipitor  Hx Postoperative atrial fibrillation ([place]) Patient reports was previously on Coumadin but does not want to be on blood thinners again, see A-fib/RVR  Stage 3b chronic kidney disease (CKD) ([place]) Renal function close to baseline.  Creatinine 1.06, BUN 17 Follow-up with BMP  Iron deficiency anemia Hemoglobin stable 8.7 (8.9 on 01/07/2022) Follow-up with CBC  Protein-calorie malnutrition, moderate (HCC) Body weight 47.6 kg, BMI 19.20 Ensure Nutrition consult       DVT prophylaxis: lovenox Code Status: FULL Family Communication: brother at bedside on afternoon rounds Disposition Plan / TOC needs: Anticipate discharge home but may need home health, depending on clinical improvement may need PT/OT to eval for HH/SNF Barriers to discharge / significant pending items: pending improvement/stabilization of above issues             Objective: Vitals:   01/25/22 1333 01/25/22 1338 01/25/22 1543 01/25/22 1633  BP:   (!) 123/50   Pulse: 94  95 91  Resp: '18  19 16  '$ Temp: 97.7 F (36.5 C)   97.9 F (36.6 C)  TempSrc: Oral     SpO2: 99% 99% 93% 95%  Weight:      Height:        Intake/Output Summary (Last 24 hours) at 01/25/2022 1736 Last data filed at 01/25/2022 1341 Gross per 24 hour  Intake 240 ml  Output 700 ml  Net -460 ml   Filed Weights   01/29/2022 0305  Weight: 47.6 kg    Examination:  Constitutional:  VS as above General Appearance: alert, NAD Ears, Nose, Mouth, Throat: Normal appearance Normal external auditory canal and TM bilaterally MMM, posterior pharynx without erythema/exudate Neck: No masses, trachea midline No thyroid enlargement/tenderness/mass appreciated Respiratory: Fair respiratory effort Breath sounds diminished all fields but especially LLL Cardiovascular: S1/S2 normal on reexam in PM No  lower extremity edema Gastrointestinal: Nontender, no masses No hernia appreciated Musculoskeletal: No clubbing/cyanosis of digits Neurological: No cranial nerve deficit on limited exam Psychiatric: Normal judgment/insight Normal mood and affect       Scheduled Medications:   atorvastatin  40 mg Oral QHS   clopidogrel  75 mg Oral Daily   enoxaparin (LOVENOX) injection  30 mg Subcutaneous Q24H   [START ON 01/26/2022] feeding supplement  237 mL Oral BID BM   ferrous [id number] C-folic acid  1 capsule Oral BID PC   furosemide  20 mg Intravenous Daily   ipratropium-albuterol  3 mL Nebulization Q6H   methylPREDNISolone (SOLU-MEDROL) injection  40 mg Intravenous Q12H   metoprolol tartrate  100 mg Oral Q12H   [START ON 01/26/2022] mometasone-formoterol  2 puff Inhalation BID   [START ON 01/26/2022] multivitamin with minerals  1 tablet Oral Daily   pantoprazole  40 mg Oral BID    Continuous Infusions:  azithromycin (ZITHROMAX) 500 mg in sodium chloride 0.9 % 250 mL IVPB Stopped (01/25/22 1141)   cefTRIAXone (ROCEPHIN)  IV Stopped (01/25/22 0444)   diltiazem (CARDIZEM) infusion 5 mg/hr (01/25/22 1341)    PRN Medications:  albuterol, hydrALAZINE, methocarbamol, nitroGLYCERIN, [DISCONTINUED] ondansetron **OR** ondansetron (ZOFRAN) IV, oxyCODONE, promethazine  Antimicrobials:  Anti-infectives (From admission, onward)    Start     Dose/Rate Route Frequency Ordered Stop   01/25/22 1000  azithromycin (ZITHROMAX) tablet 250 mg  Status:  Discontinued  250 mg Oral Daily 01/29/2022 0852 01/14/2022 1001   01/25/22 0500  azithromycin (ZITHROMAX) 500 mg in sodium chloride 0.9 % 250 mL IVPB        500 mg 250 mL/hr over 400 Minutes Intravenous Every 24 hours 01/10/2022 1003     01/25/22 0500  cefTRIAXone (ROCEPHIN) 1 g in sodium chloride 0.9 % 100 mL IVPB        1 g 200 mL/hr over 30 Minutes Intravenous Every 24 hours 01/08/2022 1003     01/17/2022 0600  cefTRIAXone (ROCEPHIN) 1 g in  sodium chloride 0.9 % 100 mL IVPB  Status:  Discontinued        1 g 200 mL/hr over 30 Minutes Intravenous Every 24 hours 01/16/2022 0452 01/26/2022 0851   01/04/2022 0445  cefTRIAXone (ROCEPHIN) 1 g in sodium chloride 0.9 % 100 mL IVPB  Status:  Discontinued        1 g 200 mL/hr over 30 Minutes Intravenous  Once 01/06/2022 0433 01/06/2022 0500   01/16/2022 0445  azithromycin (ZITHROMAX) 500 mg in sodium chloride 0.9 % 250 mL IVPB        500 mg 250 mL/hr over 60 Minutes Intravenous  Once 01/15/2022 0433 01/14/2022 0718       Data Reviewed: I have personally reviewed following labs and imaging studies  CBC: Recent Labs  Lab 01/02/2022 0313 01/25/22 0344  WBC 10.7* 18.2*  NEUTROABS 6.8  --   HGB 8.7* 7.7*  HCT 28.8* 24.9*  MCV 89.7 88.9  PLT 683* 440*   Basic Metabolic Panel: Recent Labs  Lab 01/17/2022 0313 01/25/22 0344  NA 142 141  K 4.1 4.4  CL 116* 116*  CO2 18* 20*  GLUCOSE 104* 146*  BUN 17 20  CREATININE 1.06* 1.23*  CALCIUM 8.6* 8.4*  MG 2.3  --    GFR: Estimated Creatinine Clearance: 25.6 mL/min (A) (by C-G formula based on SCr of 1.23 mg/dL (H)). Liver Function Tests: Recent Labs  Lab 01/29/2022 0313  AST 18  ALT 10  ALKPHOS 116  BILITOT 0.5  PROT 6.4*  ALBUMIN 2.8*   No results for input(s): "LIPASE", "AMYLASE" in the last 168 hours. No results for input(s): "AMMONIA" in the last 168 hours. Coagulation Profile: Recent Labs  Lab 01/25/22 1518  INR 1.3*   Cardiac Enzymes: No results for input(s): "CKTOTAL", "CKMB", "CKMBINDEX", "TROPONINI" in the last 168 hours. BNP (last 3 results) No results for input(s): "PROBNP" in the last 8760 hours. HbA1C: No results for input(s): "HGBA1C" in the last 72 hours. CBG: No results for input(s): "GLUCAP" in the last 168 hours. Lipid Profile: No results for input(s): "CHOL", "HDL", "LDLCALC", "TRIG", "CHOLHDL", "LDLDIRECT" in the last 72 hours. Thyroid Function Tests: No results for input(s): "TSH", "T4TOTAL", "FREET4",  "T3FREE", "THYROIDAB" in the last 72 hours. Anemia Panel: No results for input(s): "VITAMINB12", "FOLATE", "FERRITIN", "TIBC", "IRON", "RETICCTPCT" in the last 72 hours. Urine analysis:    Component Value Date/Time   COLORURINE YELLOW (A) 01/04/2022 1839   APPEARANCEUR CLEAR (A) 01/08/2022 1839   APPEARANCEUR Clear 03/23/2019 0957   LABSPEC 1.018 01/29/2022 1839   PHURINE 5.0 01/22/2022 1839   GLUCOSEU NEGATIVE 01/16/2022 1839   HGBUR NEGATIVE 01/01/2022 1839   BILIRUBINUR NEGATIVE 01/05/2022 1839   BILIRUBINUR Negative 03/23/2019 0957   [place] NEGATIVE 01/21/2022 1839   PROTEINUR NEGATIVE 01/01/2022 1839   NITRITE NEGATIVE 01/07/2022 1839   LEUKOCYTESUR MODERATE (A) 01/11/2022 1839   Sepsis Labs: '@LABRCNTIP'$ (procalcitonin:4,lacticidven:4)  Recent Results (from the past 240  hour(s))  SARS Coronavirus 2 by RT PCR (hospital order, performed in [facility] hospital lab) *cepheid single result test* Anterior Nasal Swab     Status: None   Collection Time: 12/30/2021  3:17 AM   Specimen: Anterior Nasal Swab  Result Value Ref Range Status   SARS Coronavirus 2 by RT PCR NEGATIVE NEGATIVE Final    Comment: (NOTE) SARS-CoV-2 target nucleic acids are NOT DETECTED.  The SARS-CoV-2 RNA is generally detectable in upper and lower respiratory specimens during the acute phase of infection. The lowest concentration of SARS-CoV-2 viral copies this assay can detect is 250 copies / mL. A negative result does not preclude SARS-CoV-2 infection and should not be used as the sole basis for treatment or other patient management decisions.  A negative result may occur with improper specimen collection / handling, submission of specimen other than nasopharyngeal swab, presence of viral mutation(s) within the areas targeted by this assay, and inadequate number of viral copies (<250 copies / mL). A negative result must be combined with clinical observations, patient history, and epidemiological  information.  Fact Sheet for Patients:   https://www.patel.info/  Fact Sheet for Healthcare Providers: https://hall.com/  This test is not yet approved or  cleared by the [place] FDA and has been authorized for detection and/or diagnosis of SARS-CoV-2 by FDA under an Emergency Use Authorization (EUA).  This EUA will remain in effect (meaning this test can be used) for the duration of the COVID-19 declaration under Section 564(b)(1) of the Act, 21 U.S.C. section 360bbb-3(b)(1), unless the authorization is terminated or revoked sooner.  Performed at [facility], [name] [place], [place] [address]   Blood culture (routine x 2)     Status: None (Preliminary result)   Collection Time: 01/28/2022  4:54 AM   Specimen: BLOOD  Result Value Ref Range Status   Specimen Description BLOOD RIGHT [facility]  Final   Special Requests   Final    BOTTLES DRAWN AEROBIC AND ANAEROBIC Blood Culture adequate volume   Culture   Final    NO GROWTH 1 DAY Performed at [facility], [address] [place], [place] [address]    Report Status PENDING  Incomplete  Blood culture (routine x 2)     Status: None (Preliminary result)   Collection Time: 01/18/2022  4:54 AM   Specimen: BLOOD  Result Value Ref Range Status   Specimen Description BLOOD LEFT [facility]  Final   Special Requests   Final    BOTTLES DRAWN AEROBIC AND ANAEROBIC Blood Culture adequate volume   Culture   Final    NO GROWTH 1 DAY Performed at [facility], [address] [place], [place] [address]    Report Status PENDING  Incomplete  Body fluid culture w Gram Stain     Status: None (Preliminary result)   Collection Time: 01/14/2022 12:05 PM   Specimen: PATH Cytology Pleural fluid  Result Value Ref Range Status   Specimen Description   Final    PLEURAL Performed at [facility], [address] [place], [place] [address]    Special Requests   Final     PLEURAL Performed at [facility], [address] [place],  [address]    Gram Stain NO WBC SEEN NO ORGANISMS SEEN   Final   Culture   Final    NO GROWTH < 24 HOURS Performed at [facility], [place] [address], [place],  [address]    Report  Status PENDING  Incomplete         Radiology Studies last 96 hours: DG ESOPHAGUS W SINGLE CM (SOL OR THIN BA)  Result Date: 01/25/2022 CLINICAL DATA:  Patient with history of hiatal hernia, moderate narrowing of the distal esophagus on UGI 2018, globus sensation with medications, coughing/choking when taking multi-vitamin today. Request for esophagram for further evaluation. EXAM: ESOPHAGUS/BARIUM SWALLOW/TABLET STUDY TECHNIQUE: Single contrast examination was performed using thin liquid barium. This exam was performed by Candiss Norse, PA-C, and was supervised and interpreted by Zetta Bills, MD FLUOROSCOPY: Radiation Exposure Index (as provided by the fluoroscopic device): 22.3 mGy Kerma COMPARISON:  CT angiography of the chest which was performed on January 24, 2022 FINDINGS: Limited exam due to patient debility, she is unable to stand and experiences transient hypoxia when rolled to the oblique position. Swallowing: Swallowing is grossly normal, not well assessed due to oblique position but with no signs of aspiration. Pharynx: Limited evaluation for reasons above. Esophagus: No obstructing lesion of the esophagus. No gross narrowing of the distal esophagus. Motility without signs of substantial stasis in head of bed slightly elevated RPO and LPO positions. Esophageal motility: Within normal limits given limited exam. Hiatal Hernia: None seen on this limited exam. Gastroesophageal reflux: None visualized on limited exam. Ingested 71m barium tablet: Patient refused barium tablet Other: None. IMPRESSION: Single contrast esophagram without evidence of esophageal stricture or aspiration given limited exam due to the patient's  debilitated state and ongoing medical conditions related to respiratory distress. Electronically Signed   By: GZetta BillsM.D.   On: 01/25/2022 15:21   DG Chest Port 1 View  Result Date: 01/05/2022 CLINICAL DATA:  Status post right thoracentesis. EXAM: PORTABLE CHEST 1 VIEW COMPARISON:  Same day. FINDINGS: No pneumothorax is noted status post right thoracentesis. Right pleural effusion is significantly smaller. IMPRESSION: No pneumothorax status post right thoracentesis. Electronically Signed   By: JMarijo ConceptionM.D.   On: 01/14/2022 12:52   [place]ORACENTESIS ASP PLEURAL SPACE W/IMG GUIDE  Result Date: 01/19/2022 INDICATION: Patient with history of COPD, shortness of breath, right pleural effusion. Request is made for diagnostic and therapeutic right thoracentesis. EXAM: ULTRASOUND GUIDED DIAGNOSTIC AND THERAPEUTIC RIGHT THORACENTESIS MEDICATIONS: 10 mL 1% lidocaine COMPLICATIONS: None immediate. PROCEDURE: An ultrasound guided thoracentesis was thoroughly discussed with the patient and questions answered. The benefits, risks, alternatives and complications were also discussed. The patient understands and wishes to proceed with the procedure. Written consent was obtained. Ultrasound was performed to localize and mark an adequate pocket of fluid in the right chest. The area was then prepped and draped in the normal sterile fashion. 1% Lidocaine was used for local anesthesia. Under ultrasound guidance a 6 Fr Safe-T-Centesis catheter was introduced. Thoracentesis was performed. The catheter was removed and a dressing applied. FINDINGS: A total of approximately 900 mL of clear, yellow fluid was removed. Samples were sent to the laboratory as requested by the clinical team. IMPRESSION: Successful ultrasound guided diagnostic and therapeutic right thoracentesis yielding 900 mL of pleural fluid. Read by: KBrynda GreathousePA-C Electronically Signed   By: MJerilynn Mages  Shick M.D.   On: 01/18/2022 12:17   ECHOCARDIOGRAM  COMPLETE  Result Date: 01/28/2022    ECHOCARDIOGRAM REPORT   [facility]   [facility] Medical Rec #:  [id number]             Height:       62.0 in Accession #:    [id number]  Weight:       105.0 lb Date of Birth:  [birth year]               BSA:          1.454 m Patient Age:    43 years               BP:           102/81 mmHg Patient Gender: F                      HR:           114 bpm. Exam Location:  [facility] Procedure: 2D Echo, Color Doppler and Cardiac Doppler Indications:     I50.31 congestive heart failure-Acute Diastolic  History:         Patient has prior history of Echocardiogram examinations. CAD;                  Risk Factors:Hypertension and Dyslipidemia.  Sonographer:     Charmayne Sheer Referring Phys:  Unknown Foley NIU Diagnosing Phys: Neoma Laming  Sonographer Comments: Suboptimal parasternal window. IMPRESSIONS  1. Left ventricular ejection fraction, by estimation, is 60 to 65%. The left ventricle has normal function. The left ventricle has no regional wall motion abnormalities. Left ventricular diastolic parameters are indeterminate.  2. Right ventricular systolic function is normal. The right ventricular size is moderately enlarged.  3. Left atrial size was moderately dilated.  4. Right atrial size was moderately dilated.  5. The mitral valve is normal in structure. Mild to moderate mitral valve regurgitation. No evidence of mitral stenosis.  6. The aortic valve is normal in structure. Aortic valve regurgitation is mild to moderate. Aortic valve sclerosis/calcification is present, without any evidence of aortic stenosis.  7. The inferior vena cava is normal in size with greater than 50% respiratory variability, suggesting right atrial pressure of 3 mmHg. FINDINGS  Left Ventricle: Left ventricular ejection fraction, by estimation, is 60 to 65%. The left ventricle has normal function. The left ventricle has no regional wall motion abnormalities. The left  ventricular internal cavity size was normal in size. There is  no left ventricular hypertrophy. Left ventricular diastolic parameters are indeterminate. Right Ventricle: The right ventricular size is moderately enlarged. No increase in right ventricular wall thickness. Right ventricular systolic function is normal. Left Atrium: Left atrial size was moderately dilated. Right Atrium: Right atrial size was moderately dilated. Pericardium: There is no evidence of pericardial effusion. Mitral Valve: The mitral valve is normal in structure. Mild to moderate mitral valve regurgitation. No evidence of mitral valve stenosis. Tricuspid Valve: The tricuspid valve is normal in structure. Tricuspid valve regurgitation is mild . No evidence of tricuspid stenosis. Aortic Valve: The aortic valve is normal in structure. Aortic valve regurgitation is mild to moderate. Aortic valve sclerosis/calcification is present, without any evidence of aortic stenosis. Aortic valve mean gradient measures 9.0 mmHg. Aortic valve peak gradient measures 15.7 mmHg. Aortic valve area, by VTI measures 1.37 cm. Pulmonic Valve: The pulmonic valve was normal in structure. Pulmonic valve regurgitation is not visualized. No evidence of pulmonic stenosis. Aorta: The aortic root is normal in size and structure. Venous: The inferior vena cava is normal in size with greater than 50% respiratory variability, suggesting right atrial pressure of 3 mmHg. IAS/Shunts: No atrial level shunt detected by color flow Doppler.  LEFT VENTRICLE PLAX 2D LVIDd:         3.37 cm  Diastology LVIDs:         2.28 cm   LV e' medial:    4.57 cm/s LV PW:         0.78 cm   LV E/e' medial:  31.6 LV IVS:        0.70 cm   LV e' lateral:   6.20 cm/s LVOT diam:     1.80 cm   LV E/e' lateral: 23.3 LV SV:         53 LV SV Index:   37 LVOT Area:     2.54 cm  RIGHT VENTRICLE RV Basal diam:  3.32 cm LEFT ATRIUM             Index        RIGHT ATRIUM           Index LA diam:        3.80 cm 2.61  cm/m   RA Area:     12.20 cm LA Vol (A2C):   30.4 ml 20.91 ml/m  RA Volume:   26.90 ml  18.50 ml/m LA Vol (A4C):   55.0 ml 37.83 ml/m LA Biplane Vol: 42.1 ml 28.96 ml/m  AORTIC VALVE AV Area (Vmax):    1.48 cm AV Area (Vmean):   1.41 cm AV Area (VTI):     1.37 cm AV Vmax:           198.00 cm/s AV Vmean:          144.000 cm/s AV VTI:            0.390 m AV Peak Grad:      15.7 mmHg AV Mean Grad:      9.0 mmHg LVOT Vmax:         115.00 cm/s LVOT Vmean:        80.000 cm/s LVOT VTI:          0.210 m LVOT/AV VTI ratio: 0.54  AORTA Ao Root diam: 2.70 cm MITRAL VALVE                TRICUSPID VALVE MV Area (PHT): 3.65 cm     TR Peak grad:   40.7 mmHg MV Decel Time: 208 msec     TR Vmax:        319.00 cm/s MV E velocity: 144.33 cm/s                             SHUNTS                             Systemic VTI:  0.21 m                             Systemic Diam: 1.80 cm Neoma Laming Electronically signed by Neoma Laming Signature Date/Time: 01/29/2022/12:12:10 PM    Final    US Venous Img Lower Bilateral (DVT)  Result Date: 01/13/2022 CLINICAL DATA:  Bilateral lower extremity edema.  Evaluate for DVT. EXAM: BILATERAL LOWER EXTREMITY VENOUS DOPPLER ULTRASOUND TECHNIQUE: Gray-scale sonography with graded compression, as well as color Doppler and duplex ultrasound were performed to evaluate the lower extremity deep venous systems from the level of the common femoral vein and including the common femoral, femoral, profunda femoral, popliteal and calf veins including the posterior tibial, peroneal and gastrocnemius veins when visible. The superficial great saphenous vein was also  interrogated. Spectral Doppler was utilized to evaluate flow at rest and with distal augmentation maneuvers in the common femoral, femoral and popliteal veins. COMPARISON:  Left lower extremity venous Doppler ultrasound-12/17/2007 (negative). FINDINGS: RIGHT LOWER EXTREMITY Common Femoral Vein: No evidence of thrombus. Normal compressibility,  respiratory phasicity and response to augmentation. Saphenofemoral Junction: No evidence of thrombus. Normal compressibility and flow on color Doppler imaging. Profunda Femoral Vein: No evidence of thrombus. Normal compressibility and flow on color Doppler imaging. Femoral Vein: No evidence of thrombus. Normal compressibility, respiratory phasicity and response to augmentation. Popliteal Vein: No evidence of thrombus. Normal compressibility, respiratory phasicity and response to augmentation. Calf Veins: No evidence of thrombus. Normal compressibility and flow on color Doppler imaging. Superficial Great Saphenous Vein: No evidence of thrombus. Normal compressibility. Other Findings:  None. LEFT LOWER EXTREMITY Common Femoral Vein: No evidence of thrombus. Normal compressibility, respiratory phasicity and response to augmentation. Saphenofemoral Junction: No evidence of thrombus. Normal compressibility and flow on color Doppler imaging. Profunda Femoral Vein: No evidence of thrombus. Normal compressibility and flow on color Doppler imaging. Femoral Vein: No evidence of thrombus. Normal compressibility, respiratory phasicity and response to augmentation. Popliteal Vein: No evidence of thrombus. Normal compressibility, respiratory phasicity and response to augmentation. Calf Veins: No evidence of thrombus. Normal compressibility and flow on color Doppler imaging. Superficial Great Saphenous Vein: No evidence of thrombus. Normal compressibility. Other Findings:  None. IMPRESSION: No evidence of DVT within either lower extremity. Electronically Signed   By: Sandi Mariscal M.D.   On: 01/23/2022 10:01   CT Angio Chest Pulmonary Embolism (PE) W or WO Contrast  Result Date: 12/31/2021 CLINICAL DATA:  Shortness of breath EXAM: CT ANGIOGRAPHY CHEST WITH CONTRAST TECHNIQUE: Multidetector CT imaging of the chest was performed using the standard protocol during bolus administration of intravenous contrast. Multiplanar CT image  reconstructions and MIPs were obtained to evaluate the vascular anatomy. RADIATION DOSE REDUCTION: This exam was performed according to the departmental dose-optimization program which includes automated exposure control, adjustment of the mA and/or kV according to patient size and/or use of iterative reconstruction technique. CONTRAST:  29m OMNIPAQUE IOHEXOL 350 MG/ML SOLN COMPARISON:  Chest CT dated January 03, 2022 FINDINGS: Cardiovascular: No evidence of pulmonary embolus. Mild cardiomegaly. No pericardial effusion. Normal caliber thoracic aorta with severe atherosclerotic disease. Three-vessel coronary artery calcifications status post CABG. Mediastinum/Nodes: Mildly enlarged subcarinal lymph node, measuring up to 1.3 cm, unchanged in size when compared with prior exam, other subcentimeter mediastinal and hilar lymph nodes are seen which are unchanged in size. Patulous esophagus. Thyroid is unremarkable. Lungs/Pleura: Central airways are patent. Bilateral bronchial wall thickening. Moderate severe centrilobular emphysema. Moderate right and small left loculated pleural effusions, increased in size when compared with prior exam. No evidence of pleural thickening, however contrast timing limits evaluation. Left upper lobe subpleural linear consolidation is unchanged when compared with prior. Upper Abdomen: Partially visualized simple appearing left renal cysts, no further follow-up imaging is recommended for this lesion. No acute abnormality. Musculoskeletal: Prior median sternotomy. Severe compression deformity of T10, unchanged when compared to prior exam. No aggressive appearing osseous lesions. Review of the MIP images confirms the above findings. IMPRESSION: 1. No evidence pulmonary embolus. 2. Moderate right and small left loculated pleural effusions, increased in size when compared to prior exam with new areas of loculation, concerning for empyemas. 3. Increased bilateral bronchial wall thickening, findings  can be seen in the setting of bronchitis. 4. Left upper lobe subpleural linear consolidation is unchanged when compared with prior  exam and possibly due to scarring, follow-up chest CT in 3 months is recommended to ensure stability. 5. Severe aortic Atherosclerosis (ICD10-I70.0) and Emphysema (ICD10-J43.9). Electronically Signed   By: Yetta Glassman M.D.   On: 01/08/2022 09:42   DG Chest Portable 1 View  Result Date: 01/03/2022 CLINICAL DATA:  Shortness of breath. EXAM: PORTABLE CHEST 1 VIEW COMPARISON:  January 02, 2022 FINDINGS: Multiple sternal wires are noted. The heart size and mediastinal contours are within normal limits. There is marked severity calcification of the aortic arch. Mild to moderate severity, diffusely increased interstitial lung markings are seen. Moderate to marked severity areas of atelectasis and/or infiltrate are seen within the bilateral lung bases. There are moderate sized bilateral pleural effusions. No pneumothorax is identified. The visualized skeletal structures are unremarkable. IMPRESSION: 1. Evidence of prior median sternotomy. 2. Mild to moderate severity interstitial edema. 3. Moderate to marked severity bibasilar atelectasis and/or infiltrate. 4. Moderate sized bilateral pleural effusions. Electronically Signed   By: Virgina Norfolk M.D.   On: 01/28/2022 03:45            LOS: 1 day       Emeterio Reeve, DO [facility] 01/25/2022, 5:19 PM   Staff may message me via secure chat in [place]  but this may not receive immediate response,  please page for urgent matters!  If 7PM-7AM, please contact night-coverage [email or web address]  Dictation software was used to generate the above note. Typos may occur and escape review, as with typed/written notes. Please contact Dr Sheppard Coil directly for clarity if needed.

## 2022-01-25 NOTE — ED Notes (Addendum)
Pt given PO meds, swallowed water fine before meds. No cough/no choke. Took iron tab without difficulty. Swallowed water in between meds. Took MVM tablet began to cough/choke possible aspiration of water. RN Erlene Quan and Brady to bedside to assist. PT in upright position. Pt back to basline after a moment. DO Alexander aware, SLP placed. Pt agreeable to plan. HR still elevated, meds given per MAR. Pt now resting with no needs in RN view.

## 2022-01-25 NOTE — ED Notes (Signed)
Pt asleep. Cardiac monitor shows a fib with rate 100-130. Hospitalist informed for new orders.

## 2022-01-25 NOTE — ED Notes (Addendum)
NP  [place] paged. Patient is tachycardic with AFIB .

## 2022-01-25 NOTE — ED Notes (Signed)
Pt reports hx of esophagus stretching in the past few years and states was " recently starting to try and take her home meds whole"

## 2022-01-25 NOTE — Progress Notes (Signed)
Admission profile updated. ?

## 2022-01-25 NOTE — ED Notes (Signed)
Diltiazem infusion paused. Pt is NSR with HR in 80s. Repeat EKG was obtained.

## 2022-01-25 NOTE — Hospital Course (Addendum)
Olivia Lane is a 86 y.o. female with medical history significant of COPD on 3 L oxygen, hypertension, hyperlipidemia, GERD, anemia, CAD, CABG, postoperative atrial fibrillation not on anticoagulants, CKD-CAD, who presents to ED 01/25/2022 with shortness of breath. 07/26: Admitted for COPD exacerbation. (+)SIRS w/ HR>90, RR>20 but was afebrile and no leukocytosis. Lactic acid 1.8. Loculated pleural effusion, cannot r/o empyema so possible sepsis pending thoracentesis. Treating for CAP w/ azithromycin and ceftriaxone. Thoracentesis yielded likely transudate effusion based on Light's criteria  07/27: WBC increased likely d/t steroids. AFib RVR not responding to IV pushes metoprolol but converted to Afib reg rate w/ cardizem gtt. RN had concerns for choking episode, SLP evaluated and recommended barium swallow study which showed no concerns, po meds restarted. Has asymmetric leg edema with trace right leg edema and 1+ left leg edema, small area of erythema on 3rd toe.  07/28: SOB in AM, improved on NRB and w/ neb tx. Back into AFib RVR and restarted diltizaem bolus/drip, started heparin, consulted cardiology.  Will try tapering off diltiazem drip later today, plan for Eliquis, decreased Lasix. Became more SOB and CXR demonstrated increased fluid, BNP trending up, re-dosed w/ Lasix at higher dose 80 mg IV x1.  07/29: Overnight, increased O2 requirement prompted transfer to SDU. Remains on HFNC this morning. Spoke w/ her son Nicole Kindred and her sister at bedside w/ patient - will continue current treatment but will not pursue intubation/CPR. Pulm following. CT chest pending. Repeat Lasix 80 mg IV. Later in the day, continued to decline, was unable to tolerate going to CT d/t even briefly off O2 she had significant desaturation. Reconvened w/ family and patient, discussed declining status, offered continued treatment as able bs option for comfort measures since patient reports anxiety and "feeling just tired" and  desire to "be done." See IPAL note. Transitioned to comfort measures

## 2022-01-25 NOTE — ED Notes (Signed)
Warm blankets and cup of ice provided.

## 2022-01-25 NOTE — Assessment & Plan Note (Addendum)
On admission, patient has a sinus rhythm, with frequent PAC.  Went into A-fib RVR 01/25/2022, initially on diltiazem drip which was tapered off and then needed to be restarted 01/26/2022 at which point cardiology was consulted  Continue diltiazem for now, if remaining controlled in 90s to 100s, can titrate off later today and convert to short acting diltiazem 60 mg every 6 hours  switching metoprolol to tartrate 100 mg twice daily,   continue heparin drip  patient is agreeable to start Eliquis 2.5 twice daily

## 2022-01-25 NOTE — ED Notes (Signed)
MD made aware of elevated HR still elevated, SLP in discussion with further steps of tests to be ordered.

## 2022-01-26 ENCOUNTER — Inpatient Hospital Stay: Payer: Medicare Other

## 2022-01-26 DIAGNOSIS — Z9889 Other specified postprocedural states: Secondary | ICD-10-CM

## 2022-01-26 DIAGNOSIS — I5031 Acute diastolic (congestive) heart failure: Secondary | ICD-10-CM

## 2022-01-26 DIAGNOSIS — E785 Hyperlipidemia, unspecified: Secondary | ICD-10-CM

## 2022-01-26 DIAGNOSIS — J441 Chronic obstructive pulmonary disease with (acute) exacerbation: Secondary | ICD-10-CM | POA: Diagnosis not present

## 2022-01-26 DIAGNOSIS — J9601 Acute respiratory failure with hypoxia: Secondary | ICD-10-CM

## 2022-01-26 DIAGNOSIS — J189 Pneumonia, unspecified organism: Secondary | ICD-10-CM | POA: Diagnosis not present

## 2022-01-26 DIAGNOSIS — R6520 Severe sepsis without septic shock: Secondary | ICD-10-CM

## 2022-01-26 DIAGNOSIS — I2510 Atherosclerotic heart disease of native coronary artery without angina pectoris: Secondary | ICD-10-CM

## 2022-01-26 DIAGNOSIS — Z7189 Other specified counseling: Secondary | ICD-10-CM

## 2022-01-26 DIAGNOSIS — E43 Unspecified severe protein-calorie malnutrition: Secondary | ICD-10-CM | POA: Insufficient documentation

## 2022-01-26 DIAGNOSIS — E440 Moderate protein-calorie malnutrition: Secondary | ICD-10-CM

## 2022-01-26 DIAGNOSIS — J90 Pleural effusion, not elsewhere classified: Secondary | ICD-10-CM

## 2022-01-26 DIAGNOSIS — R0602 Shortness of breath: Secondary | ICD-10-CM

## 2022-01-26 LAB — CBC
HCT: 25.3 % — ABNORMAL LOW (ref 36.0–46.0)
HCT: 25.5 % — ABNORMAL LOW (ref 36.0–46.0)
Hemoglobin: 8.1 g/dL — ABNORMAL LOW (ref 12.0–15.0)
Hemoglobin: 7.9 g/dL — ABNORMAL LOW (ref 12.0–15.0)
MCH: 28.1 pg (ref 26.0–34.0)
MCH: 26.9 pg (ref 26.0–34.0)
MCHC: 32 g/dL (ref 30.0–36.0)
MCHC: 31 g/dL (ref 30.0–36.0)
MCV: 87.8 fL (ref 80.0–100.0)
MCV: 86.7 fL (ref 80.0–100.0)
Platelets: 542 10*3/uL — ABNORMAL HIGH (ref 150–400)
Platelets: 608 10*3/uL — ABNORMAL HIGH (ref 150–400)
RBC: 2.88 MIL/uL — ABNORMAL LOW (ref 3.87–5.11)
RBC: 2.94 MIL/uL — ABNORMAL LOW (ref 3.87–5.11)
RDW: 21.2 % — ABNORMAL HIGH (ref 11.5–15.5)
RDW: 21.1 % — ABNORMAL HIGH (ref 11.5–15.5)
WBC: 18.7 10*3/uL — ABNORMAL HIGH (ref 4.0–10.5)
WBC: 17.4 10*3/uL — ABNORMAL HIGH (ref 4.0–10.5)
nRBC: 0.2 % (ref 0.0–0.2)
nRBC: 1 % — ABNORMAL HIGH (ref 0.0–0.2)

## 2022-01-26 LAB — BASIC METABOLIC PANEL
Anion gap: 9 (ref 5–15)
BUN: 24 mg/dL — ABNORMAL HIGH (ref 8–23)
CO2: 20 mmol/L — ABNORMAL LOW (ref 22–32)
Calcium: 8.6 mg/dL — ABNORMAL LOW (ref 8.9–10.3)
Chloride: 115 mmol/L — ABNORMAL HIGH (ref 98–111)
Creatinine, Ser: 1.16 mg/dL — ABNORMAL HIGH (ref 0.44–1.00)
GFR, Estimated: 46 mL/min — ABNORMAL LOW (ref >60)
Glucose, Bld: 132 mg/dL — ABNORMAL HIGH (ref 70–99)
Potassium: 4.3 mmol/L (ref 3.5–5.1)
Sodium: 144 mmol/L (ref 135–145)

## 2022-01-26 LAB — BLOOD GAS, ARTERIAL
Acid-base deficit: 5.5 mmol/L — ABNORMAL HIGH (ref 0.0–2.0)
Bicarbonate: 18 mmol/L — ABNORMAL LOW (ref 20.0–28.0)
FIO2: 100 %
O2 Content: 50 L/min
O2 Saturation: 95.7 %
Patient temperature: 37
pCO2 arterial: 29 mmHg — ABNORMAL LOW (ref 32–48)
pH, Arterial: 7.4 (ref 7.35–7.45)
pO2, Arterial: 72 mmHg — ABNORMAL LOW (ref 83–108)

## 2022-01-26 LAB — GLUCOSE, CAPILLARY: Glucose-Capillary: 198 mg/dL — ABNORMAL HIGH (ref 70–99)

## 2022-01-26 LAB — HEPARIN LEVEL (UNFRACTIONATED)
Heparin Unfractionated: >1.1 IU/mL — ABNORMAL HIGH (ref 0.30–0.70)
Heparin Unfractionated: >1.1 IU/mL — ABNORMAL HIGH (ref 0.30–0.70)

## 2022-01-26 LAB — MAGNESIUM: Magnesium: 2.2 mg/dL (ref 1.7–2.4)

## 2022-01-26 LAB — APTT: aPTT: 21 seconds — ABNORMAL LOW (ref 24–36)

## 2022-01-26 MED ORDER — BOOST / RESOURCE BREEZE PO LIQD CUSTOM
1.0000 | Freq: Three times a day (TID) | ORAL | Status: DC
  Administered 2022-01-26 (×2): 1 via ORAL

## 2022-01-26 MED ORDER — FUROSEMIDE 10 MG/ML IJ SOLN
40.0000 mg | Freq: Two times a day (BID) | INTRAMUSCULAR | Status: DC
  Administered 2022-01-26: 40 mg via INTRAVENOUS
  Filled 2022-01-26: qty 4

## 2022-01-26 MED ORDER — ORAL CARE MOUTH RINSE: 15.0000 mL | OROMUCOSAL | Status: DC | PRN

## 2022-01-26 MED ORDER — METOPROLOL TARTRATE 50 MG PO TABS
50.0000 mg | ORAL_TABLET | Freq: Four times a day (QID) | ORAL | Status: DC
  Administered 2022-01-26 – 2022-01-27 (×4): 50 mg via ORAL
  Filled 2022-01-26 (×4): qty 1

## 2022-01-26 MED ORDER — PROSOURCE PLUS PO LIQD
30.0000 mL | Freq: Three times a day (TID) | ORAL | Status: DC
  Administered 2022-01-26: 30 mL via ORAL
  Filled 2022-01-26 (×4): qty 30

## 2022-01-26 MED ORDER — HEPARIN (PORCINE) 25000 UT/250ML-% IV SOLN: 450.0000 [IU]/h | INTRAVENOUS | Status: DC

## 2022-01-26 MED ORDER — BUDESONIDE 0.25 MG/2ML IN SUSP
0.2500 mg | Freq: Two times a day (BID) | RESPIRATORY_TRACT | Status: DC
  Administered 2022-01-26 – 2022-01-27 (×2): 0.25 mg via RESPIRATORY_TRACT
  Filled 2022-01-26 (×2): qty 2

## 2022-01-26 MED ORDER — ASPIRIN 81 MG PO CHEW: 81.0000 mg | CHEWABLE_TABLET | Freq: Every day | ORAL | Status: DC

## 2022-01-26 MED ORDER — HEPARIN (PORCINE) 25000 UT/250ML-% IV SOLN
700.0000 [IU]/h | INTRAVENOUS | Status: DC
  Administered 2022-01-26: 700 [IU]/h via INTRAVENOUS
  Filled 2022-01-26: qty 250

## 2022-01-26 MED ORDER — HEPARIN BOLUS VIA INFUSION
2500.0000 [IU] | Freq: Once | INTRAVENOUS | Status: AC
  Administered 2022-01-26: 2500 [IU] via INTRAVENOUS
  Filled 2022-01-26: qty 2500

## 2022-01-26 MED ORDER — ALBUTEROL SULFATE (2.5 MG/3ML) 0.083% IN NEBU
2.5000 mg | INHALATION_SOLUTION | RESPIRATORY_TRACT | Status: DC
  Administered 2022-01-26 – 2022-01-27 (×5): 2.5 mg via RESPIRATORY_TRACT
  Filled 2022-01-26 (×6): qty 3

## 2022-01-26 MED ORDER — FUROSEMIDE 10 MG/ML IJ SOLN
80.0000 mg | Freq: Once | INTRAMUSCULAR | Status: AC
Start: 2022-01-26 — End: 2022-01-26
  Administered 2022-01-26: 80 mg via INTRAVENOUS
  Filled 2022-01-26: qty 8

## 2022-01-26 MED ORDER — DILTIAZEM HCL 25 MG/5ML IV SOLN
10.0000 mg | Freq: Once | INTRAVENOUS | Status: AC
Start: 2022-01-26 — End: 2022-01-26
  Administered 2022-01-26: 10 mg via INTRAVENOUS
  Filled 2022-01-26: qty 5

## 2022-01-26 NOTE — Progress Notes (Signed)
Heparin drip stopped  (as requested by Lattie Haw, Pharmacist) and  second Peripheral IV being placed in patient's Left hand prior to transport.

## 2022-01-26 NOTE — Progress Notes (Signed)
Mobility Specialist - Progress Note     01/26/22 1028  Mobility  Activity Contraindicated/medical hold   Per conversation with nurse, Pt not good for pt, due to respiratory distress.  Candie Mile Mobility Specialist 01/26/22 10:34 AM

## 2022-01-26 NOTE — Consult Note (Signed)
ANTICOAGULATION CONSULT NOTE - Follow Up Consult  Pharmacy Consult for heparin gtt Indication: atrial fibrillation  No Known Allergies  Patient Measurements: Height: '5\' 2"'$  (157.5 cm) Weight: 47.4 kg (104 lb 9.6 oz) IBW/kg (Calculated) : 50.1 Heparin Dosing Weight: 47.6kg  Vital Signs: Temp: 97.7 F (36.5 C) (07/28 0359) BP: 111/93 (07/28 0800) Pulse Rate: 93 (07/28 0800)  Labs: Recent Labs    01/26/2022 0313 01/01/2022 0454 01/25/22 0344 01/25/22 1518 01/26/22 0540  HGB 8.7*  --  7.7*  --  8.1*  HCT 28.8*  --  24.9*  --  25.3*  PLT 683*  --  571*  --  542*  LABPROT  --   --   --  16.1*  --   INR  --   --   --  1.3*  --   CREATININE 1.06*  --  1.23*  --  1.16*  TROPONINIHS 13 14  --   --   --     Estimated Creatinine Clearance: 27 mL/min (A) (by C-G formula based on SCr of 1.16 mg/dL (H)).   Medications: Heparin Dosing Weight: 47.6kg PTA: plavix '75mg'$  QD Inpatient: LMWH '30mg'$  q24h >> heparin gtt,  plavix '75mg'$  QD  Assessment: 86yo F w/ h/o COPD, post-op Afib (no chronic AC per pt preference, h/o VKA), diaCHF, HLD, CAD (CABG x4), PVD, HTN, IDA, & CKD3 presenting to ED with SOB and admitted for COPDE & CAP. Pharmacy consulted for mgmt of heparin gtt.   Date Time aPTT/HL Rate/Comment       Baseline Labs: aPTT - pending INR - 1.3 Hgb - 7.7>8.1 Plts - [phone]  Goal of Therapy:  Heparin level 0.3-0.7 units/ml Monitor platelets by anticoagulation protocol: Yes   Plan:  Last prophylactic dose of lovenox '30mg'$  received this AM 0830; given H/H, age, & low-body weight will use lower limit of ranges for transitioning to heparin gtt. Give 2500 units bolus x1 now; then start heparin infusion at 700 units/hr Check anti-Xa level in 8 hours and daily once consecutively therapeutic. Continue to monitor H&H and platelets daily while on heparin gtt.  Shanon Brow Zairah Arista 01/26/2022,8:53 AM

## 2022-01-26 NOTE — Progress Notes (Signed)
At start of shift, patient is alert and oriented but extremely lethargic and short of breath, labored ad difficulty breathing and low oxygen saturation. She was on [facility] at 40L at 85% and was increased by respiratory to 50L at 100%.  At start of shift patient is on Heparin at 7 units /hr and has restarted the Cardizem at 15. Lovenox has been discontinued.   Patients work of breathing, lungs and respirations completed. Patient has audible crackles in lungs and difficulty with breathing.  Nurse administered '80mg'$  of Furosemide at 8:00 pm.  8:30 pm CCMD contacted the nurse that the patient has returned to NSR with some PVCs. Patient currently has bright red sputum/ blood when she coughs. Respiratory Therapist  Jarrett Soho) and Nurse Kalman Shan)  inform Charge Nurse and Nurse Practitioner.   After assessment, Nurse Practitioner Sharion Settler, NP) has requested that patient be transferred to a step down unit because she is not maintaining her oxygen saturations.

## 2022-01-26 NOTE — Progress Notes (Signed)
Patient back in AF RVR. Notified Dr. Sheppard Coil. Restarted cardizem drip, cardizem bolus, heparin infusion, cardiology consult.     01/26/22 0900  Assess: MEWS Score  Temp 98 F (36.7 C)  BP 117/66  MAP (mmHg) 82  ECG Heart Rate (!) 120  Resp 18  Level of Consciousness Alert  SpO2 94 %  O2 Device Nasal Cannula  O2 Flow Rate (L/min) 5 L/min  Assess: MEWS Score  MEWS Temp 0  MEWS Systolic 0  MEWS Pulse 2  MEWS RR 0  MEWS LOC 0  MEWS Score 2  MEWS Score Color Yellow  Assess: if the MEWS score is Yellow or Red  Were vital signs taken at a resting state? Yes  Focused Assessment No change from prior assessment  Does the patient meet 2 or more of the SIRS criteria? Yes  Does the patient have a confirmed or suspected source of infection? Yes  Provider and Rapid Response Notified? No  MEWS guidelines implemented *See Row Information* Yes  Treat  Pain Scale 0-10  Pain Score 0  Take Vital Signs  Increase Vital Sign Frequency  Yellow: Q 2hr X 2 then Q 4hr X 2, if remains yellow, continue Q 4hrs  Escalate  MEWS: Escalate Yellow: discuss with charge nurse/RN and consider discussing with provider and RRT  Notify: Charge Nurse/RN  Name of Charge Nurse/RN Notified Jessica RN  Date Charge Nurse/RN Notified 01/26/22  Time Charge Nurse/RN Notified 1039  Document  Patient Outcome Not stable and remains on department (appropriate for Cardiac PCU)  Assess: SIRS CRITERIA  SIRS Temperature  0  SIRS Pulse 1  SIRS Respirations  0  SIRS WBC 1  SIRS Score Sum  2

## 2022-01-26 NOTE — Progress Notes (Signed)
   PATIENTAmbyr Qadri Lane         KDT:[id number]  DOB: 08-20-1935   Subjective:  RT reports increase oxygen need. RN reports hemoptysis, small amounts with coughing.  Objective: Vitals:   01/26/22 1956 01/26/22 2011  BP: (!) 127/50 108/64  Pulse: 71 (!) 105  Resp: (!) 26 18  Temp: 98.1 F (36.7 C) 98.4 F (36.9 C)  SpO2:  100%   ABG 7.4, pCO2 29, PO2 72, HCO3 18  Assessment:  General - pale  Neuro - alert and oriented CV - SR  Resp - some accessory muscle use with rate 30-31, reported as improved and not worse, hemoptysis as described above   Plan:   Acute on chronic respiratory failure with hypoxia secondary to pulmonary edema Received additional 80 lasix. Transfer to stepdown due to oxygen requirement Continue oxygen supplementation heated high flow and wean as able Strict I & O  Hemoptysis From acute pulmonary edema on heparin infusion Repeated CBC without significant drop in HGB 8.1 to 7.9 Continue heparin infusion and monitor for worsening [place]

## 2022-01-26 NOTE — Progress Notes (Signed)
At 7 am patient complained of Shortness of breath with oxygen sat in the 80's. See flowsheets. Patient's lungs auscultated and crackles heard on assessment. Patient made to sit up in bed. Nonrebreather mask placed and [place] removed. Oxygen increased from baseline of 3L to 15L. Patient also asked to take deep breaths through the nose. Breathing treatment (nebulization solutiomn) administered and patient's O2 sat increased and is now stable at 99. Will inform nurse practitioner. See Flowsheets for vitals.

## 2022-01-26 NOTE — Progress Notes (Signed)
Initial Nutrition Assessment  DOCUMENTATION CODES:   Severe malnutrition in context of chronic illness  INTERVENTION:   -Boost Breeze po TID, each supplement provides 250 kcal and 9 grams of protein  -30 ml Prosource Plus TID, each supplement provides 100 kcals and 15 grams protein -MVI with minerals daily -Liberalize diet to regular for widest variety of meal selections  NUTRITION DIAGNOSIS:   Severe Malnutrition related to chronic illness (CHF) as evidenced by severe fat depletion, severe muscle depletion.  GOAL:   Patient will meet greater than or equal to 90% of their needs  MONITOR:   PO intake, Supplement acceptance  REASON FOR ASSESSMENT:   Consult Assessment of nutrition requirement/status  ASSESSMENT:   Pt with medical history significant of COPD on 3 L oxygen, hypertension, hyperlipidemia, GERD, anemia, CAD, CABG, postoperative atrial fibrillation not on anticoagulants, CKD-CAD, who presents with shortness of breath.  Pt admitted with COPD exacerbation and possible sepsis secondary to CAP.   7/26- s/p rt thoracentesis (900 ml removed) 7/27- barium swallow WDL, advanced to heart healthy diet  Reviewed I/O's: -86 ml x 24 hours and +14 ml since admission  UOP: 700 ml x 24 hours   Spoke with pt at bedside. Pt not very talkative secondary to respiratory distress. Pt reports she is unable to talk or eat well secondary to to breathing. Breakfast tray untouched. Pt did not drink her Ensure supplement, stating she does not like it. Per shares she usually consumes 2 meals per day (Breakfast: sandwich, Dinner: meat, starch, and vegetable). Noted meal completions 0-25%.   Pt shares that she has lost weight in the past, but now wt is relatively stable. Reviewed wt hx; wt has been stable over the past 2 months.   Discussed importance of good meal and supplement intake to promote healing. Pt amenable to try [facility], as it is not milk based. Will also liberalize diet for  wider variety of meal selections.   Medications reviewed and include lasix, solu-medrol, and cardizem.   Labs reviewed.   NUTRITION - FOCUSED PHYSICAL EXAM:  Flowsheet Row Most Recent Value  Orbital Region Severe depletion  Upper Arm Region Severe depletion  Thoracic and Lumbar Region Moderate depletion  Buccal Region Severe depletion  Temple Region Severe depletion  Clavicle Bone Region Severe depletion  Clavicle and Acromion Bone Region Severe depletion  Scapular Bone Region Severe depletion  Dorsal Hand Severe depletion  Patellar Region Moderate depletion  Anterior Thigh Region Moderate depletion  Posterior Calf Region Moderate depletion  Edema (RD Assessment) Mild  Hair Reviewed  Eyes Reviewed  Mouth Reviewed  Skin Reviewed  Nails Reviewed       Diet Order:   Diet Order             Diet regular Room service appropriate? Yes; Fluid consistency: Thin  Diet effective now                   EDUCATION NEEDS:   Education needs have been addressed  Skin:  Skin Assessment: Reviewed RN Assessment  Last BM:  Unknown  Height:   Ht Readings from Last 1 Encounters:  01/29/2022 '5\' 2"'$  (1.575 m)    Weight:   Wt Readings from Last 1 Encounters:  01/26/22 47.4 kg    Ideal Body Weight:  50 kg  BMI:  Body mass index is 19.13 kg/m.  Estimated Nutritional Needs:   Kcal:  1450-1650  Protein:  80-95 grams  Fluid:  > 1.4 L  Loistine Chance, RD, LDN, [place] Registered Dietitian II Certified Diabetes Care and Education Specialist Please refer to [facility] for RD and/or RD on-call/weekend/after hours pager

## 2022-01-26 NOTE — Progress Notes (Signed)
Patient is being transferred to ICU 3. Nurse getting report is Laqueta Linden, Therapist, sports. Lattie Haw from pharmacy just sent a message to turn off her Heparin for an hour and to let the ICU nurse know.  Patient being transferred now.

## 2022-01-26 NOTE — Progress Notes (Signed)
SLP Cancellation Note  Patient Details Name: Adelyne Marchese MRN: [id number] DOB: April 18, 1936   Cancelled treatment:       Reason Eval/Treat Not Completed: SLP screened, no needs identified, will sign off  Pt with no aspiration or penetration noted on recent esophagram.   Kashvi Prevette B. Rutherford Nail, M.S., CCC-SLP, Mining engineer Certified Brain Injury [place]  [place] Office [phone] Ascom [phone] Fax [phone]

## 2022-01-26 NOTE — TOC Initial Note (Signed)
Transition of Care [facility]) - Initial/Assessment Note    Patient Details  Name: Olivia Lane MRN: [id number] Date of Birth: 24-Apr-1936  [facility] CM/SW Contact:    Alberteen Sam, LCSW Phone Number: 01/26/2022, 9:50 AM  Clinical Narrative:                  CSW met with patient at bedside to complete readmission risk assessment, patient confirms from home with son who assists her in getting to and from appointments.  Patient reports she sees NP Abernathy as PCP but see's different ones sometimes depending on avail.   Patient previously at SNF at [facility] for 2 weeks prior to returning home with son. Reports she doesn't feel she needs HH at time of discharge.   Reports she uses [employer] with [facility], patient's 2 sons help get medication for her at home.   Patient has walker and over the toilet seat at home, has 2 L O2 at baseline not sure what O2 agency, CSW to follow up. At time of discharge sons would transport home.    Expected Discharge Plan:  (TBD) Barriers to Discharge: Continued Medical Work up   Patient Goals and CMS Choice Patient states their goals for this hospitalization and ongoing recovery are:: to go home CMS Medicare.gov Compare Post Acute Care list provided to:: Patient Choice offered to / list presented to : Patient  Expected Discharge Plan and Services Expected Discharge Plan:  (TBD)                                              Prior Living Arrangements/Services   Lives with:: Adult Children                   Activities of Daily Living Home Assistive Devices/Equipment: Environmental consultant (specify type), Dentures (specify type) ADL Screening (condition at time of admission) Patient's cognitive ability adequate to safely complete daily activities?: No Is the patient deaf or have difficulty hearing?: No Does the patient have difficulty seeing, even when wearing glasses/contacts?: No Does the patient have difficulty  concentrating, remembering, or making decisions?: No Patient able to express need for assistance with ADLs?: Yes Does the patient have difficulty dressing or bathing?: Yes Independently performs ADLs?: No Communication: Independent Dressing (OT): Needs assistance Is this a change from baseline?: Pre-admission baseline Grooming: Independent Feeding: Independent Bathing: Needs assistance Is this a change from baseline?: Pre-admission baseline Toileting: Needs assistance Is this a change from baseline?: Pre-admission baseline In/Out Bed: Needs assistance Is this a change from baseline?: Pre-admission baseline Walks in Home: Needs assistance Is this a change from baseline?: Pre-admission baseline Does the patient have difficulty walking or climbing stairs?: Yes Weakness of Legs: Both Weakness of Arms/Hands: None  Permission Sought/Granted                  Emotional Assessment Appearance:: Appears stated age     Orientation: : Oriented to Self, Oriented to Place, Oriented to  Time, Oriented to Situation Alcohol / Substance Use: Not Applicable Psych Involvement: No (comment)  Admission diagnosis:  SOB (shortness of breath) [R06.02] COPD exacerbation (HCC) [J44.1] S/P thoracentesis [Z98.890] COPD with acute exacerbation ([place]) [J44.1] Community acquired pneumonia, unspecified laterality [J18.9] Patient Active Problem List   Diagnosis Date Noted   Atrial fibrillation with RVR ([place]) 01/25/2022   COPD with acute exacerbation (  [place]) 01/29/2022   Iron deficiency anemia 01/20/2022   Chronic respiratory failure with hypoxia ([place]) 01/28/2022   COPD exacerbation ([place]) 01/12/2022   Protein-calorie malnutrition, moderate (HCC) 01/21/2022   Pleural effusion 81/15/7262   Acute diastolic congestive heart failure ([place]) 01/23/2022   Hyperkalemia 01/04/2022   Closed left hip fracture ([place]) 01/03/2022   Dyslipidemia 01/03/2022   Acute respiratory failure with hypoxia ([place]) 01/03/2022    Leukocytosis 01/03/2022   AKI (acute kidney injury) ([place]) 01/03/2022   Stage 3b chronic kidney disease (CKD) ([place]) 01/03/2022   Cutaneous candidiasis 06/19/2019   Encounter for general adult medical examination with abnormal findings 03/24/2019   Atopic dermatitis 03/24/2019   Rash and nonspecific skin eruption 03/24/2019   Dysuria 03/24/2019   Screening for breast cancer 03/18/2018   Thrombocytosis 12/08/2017   Chronic obstructive pulmonary disease (COPD) ([place]) 07/11/2017   Gastro-esophageal reflux disease with esophagitis 07/11/2017   Mixed hyperlipidemia 07/11/2017   Coronary artery disease 08/27/2016   Nausea 12/02/2013   Postoperative atrial fibrillation ([place]) 06/07/2013   S/P CABG x 4 06/03/2013   Abnormal stress electrocardiogram test 05/19/2013   Anemia 05/19/2013   Former smoker 05/19/2013   Essential hypertension 05/19/2013   PVD (peripheral vascular disease) ([place]) 05/19/2013   S/P appendectomy 05/19/2013   PCP:  Jonetta Osgood, NP Pharmacy:   [facility] [place] [address] [place] [address] Phone: [phone] Fax: [phone]     Social Determinants of Health (SDOH) Interventions    Readmission Risk Interventions     No data to display

## 2022-01-26 NOTE — Consult Note (Signed)
[place] NOTE       Patient ID: Olivia Lane MRN: [id number] DOB/AGE: 1936/05/23 86 y.o.  Admit date: 01/08/2022 Referring Physician Dr. Emeterio Reeve  Primary Physician Dr. Clayborn Bigness  Primary Cardiologist Dr. Newman Pies ([place], last seen in 2015) Reason for Consultation AF RVR  HPI: Olivia Lane is an 34yoF with a PMH of CAD s/p CABG x4 (LIMA-LAD, SVG-OM1, SVG-diag, SVG-PDA 06/03/2013) with post op atrial fibrillation, PAD s/p left femoral artery stenting 2010, COPD with history of tobacco use, with recent admission 7/4 - 7/9 after a mechanical fall, now s/p left hip hemiarthroplasty 7/6 who presented to [facility] ED 01/01/2022 with dry cough and progressive shortness of breath.  Cardiology is consulted for further assistance with her A-fib with RVR.  The patient says she has not been doing well for the past month.  She was admitted the first week of July for 5 days after a mechanical fall where she fractured her left hip now s/p left Hemi arthroplasty.  She was discharged to [facility] rehab and was walking and reportedly doing well.  She was recently discharged home and 2 days ago she started feeling short of breath and shaky and has now developed a productive cough with an oxygen requirement above her recent baseline of 2 L.  She is being treated with empiric ceftriaxone and azithromycin for a COPD exacerbation.  Chest x-ray on presentation showed bilateral pleural effusions for which she underwent right thoracentesis on 7/26 which yielded 900 mL of clear yellow fluid.  Since admission she has been in and out of atrial fibrillation, RVR terminated yesterday with IV diltiazem and metoprolol, however this morning she was hypoxic to the 80s and was placed on 15 L nonrebreather and subsequently became tachycardic in A-fib with rates peaking in the 130s this morning and was started on a diltiazem drip.  At my time of evaluation the patient  appears somewhat short of breath on 2 L by nasal cannula.  She says she feels "shaky" and overall poor.  She has pleuritic chest discomfort associated with coughing and some lower extremity edema bilaterally that has improved since admission.  She denies orthopnea, dizziness, presyncope.  She has not seen her cardiologist in a number of years but was previously on warfarin for anticoagulation when she was in atrial fibrillation postoperatively from her CABG.  She is not sure when she stopped warfarin but refers to it as "rat poison" and does not ever want to take it again.  She has a 40-pack-year history of smoking, quit in 2014 at the time of her CABG.  She has a family history of atrial fibrillation and her sister and high blood pressure and her father and brother.  Labs are notable for a potassium of 4.3, BUN/creatinine 24/1.16, GFR 46.  Magnesium 2.2.  Worsening leukocytosis from 10.7-18.7 possibly 2/2 steroids, stable anemia 8.1/25.3, thrombocytosis to 542.  Procalcitonin negative lactate 1.8.  BNP elevated at 912, high-sensitivity troponin negative at 13-14.  Chest x-ray on admission with mild to moderate interstitial edema and moderate bilateral pleural effusions.  CT angio without evidence of pulmonary embolus and moderate right and small left loculated pleural effusions concerning for empyema.  Thoracentesis performed which met criteria for transudative effusion.  Review of systems complete and found to be negative unless listed above     Past Medical History:  Diagnosis Date   Anemia    Gastro-esophageal reflux    Hyperlipidemia    Hypertension  Past Surgical History:  Procedure Laterality Date   APPENDECTOMY  2004   BYPASS GRAFT  2014   CATARACT EXTRACTION  [id number]   COLONOSCOPY  2004   HIP ARTHROPLASTY Left 01/04/2022   Procedure: ARTHROPLASTY BIPOLAR HIP (HEMIARTHROPLASTY);  Surgeon: Hessie Knows, MD;  Location: [facility] ORS;  Service: Orthopedics;  Laterality: Left;     Medications Prior to Admission  Medication Sig Dispense Refill Last Dose   albuterol (VENTOLIN HFA) 108 (90 Base) MCG/ACT inhaler Inhale 2 puffs into the lungs every 6 (six) hours as needed for wheezing or shortness of breath. 8 g 4    amLODipine (NORVASC) 10 MG tablet TAKE 1 TABLET BY MOUTH EVERY NIGHT AT BEDTIME FOR HYPERTENSION. 90 tablet 3    atorvastatin (LIPITOR) 40 MG tablet Take 1 tablet (40 mg total) by mouth at bedtime. 90 tablet 3    budesonide-formoterol (SYMBICORT) 160-4.5 MCG/ACT inhaler Inhale 2 puffs into the lungs 2 (two) times daily. Inhale 2 puffs into lungs 2 (two) times daily PLEASE USE NAME BRAND: SYMBICORT 1 each 12    clopidogrel (PLAVIX) 75 MG tablet Take 1 tablet (75 mg total) by mouth daily. 90 tablet 3    feeding supplement (ENSURE ENLIVE / ENSURE PLUS) LIQD Take 237 mLs by mouth 2 (two) times daily between meals. 237 mL 12    ferrous PNTIRWER-[id number]-[id number] C-folic acid (TRINSICON / FOLTRIN) capsule Take 1 capsule by mouth 2 (two) times daily after a meal.      methocarbamol (ROBAXIN) 500 MG tablet Take 1 tablet (500 mg total) by mouth every 6 (six) hours as needed for muscle spasms.      metoprolol tartrate (LOPRESSOR) 50 MG tablet Take 1 tablet (50 mg total) by mouth every 12 (twelve) hours. 180 tablet 3    Multiple Vitamin (MULTIVITAMIN WITH MINERALS) TABS tablet Take 1 tablet by mouth daily.      nitroGLYCERIN (NITROSTAT) 0.4 MG SL tablet Place under the tongue.      oxyCODONE (OXY IR/ROXICODONE) 5 MG immediate release tablet Take 0.5 tablets (2.5 mg total) by mouth every 4 (four) hours as needed for moderate pain. 30 tablet 0    pantoprazole (PROTONIX) 40 MG tablet Take 1 tablet (40 mg total) by mouth 2 (two) times daily. 180 tablet 3    promethazine (PHENERGAN) 12.5 MG tablet Take 1 tablet (12.5 mg total) by mouth every 6 (six) hours as needed for nausea or vomiting. 30 tablet 0    Vitamin D, Ergocalciferol, (DRISDOL) 1.25 MG (50000 UNIT) CAPS capsule Take 1 capsule  (50,000 Units total) by mouth every 7 (seven) days. 12 capsule 1    Social History   Socioeconomic History   Marital status: Single    Spouse name: Not on file   Number of children: Not on file   Years of education: Not on file   Highest education level: Not on file  Occupational History   Not on file  Tobacco Use   Smoking status: Former    Packs/day: 1.00    Years: 40.00    Total pack years: 40.00    Types: Cigarettes    Quit date: 03/24/2013    Years since quitting: 8.8   Smokeless tobacco: Never  Vaping Use   Vaping Use: Never used  Substance and Sexual Activity   Alcohol use: No   Drug use: No   Sexual activity: Not on file  Other Topics Concern   Not on file  Social History Narrative   Not on file  Social Determinants of Health   Financial Resource Strain: Low Risk  (11/09/2020)   Overall Financial Resource Strain (CARDIA)    Difficulty of Paying Living Expenses: Not hard at all  Food Insecurity: Not on file  Transportation Needs: Not on file  Physical Activity: Not on file  Stress: Not on file  Social Connections: Not on file  Intimate Partner Violence: Not on file    Family History  Problem Relation Age of Onset   Hypertension Father    Coronary artery disease Sister    Hypertension Sister    Diabetes Brother    Hypertension Brother       PHYSICAL EXAM General: Elderly, thin and ill-appearing Caucasian female, sitting upright in hospital bed with her sister at bedside. HEENT:  Normocephalic and atraumatic. Neck:  No JVD.  Lungs: Slight increased work of breathing on 2 L by nasal cannula.  Bibasilar crackles with trace expiratory wheezes.   Heart: Irregularly irregular rate and rhythm. Normal S1 and S2 without gallops or murmurs.  Abdomen: Non-distended appearing.  Msk: Normal strength and tone for age. Extremities: Warm and well perfused. No clubbing, cyanosis.  1+ left lower extremity edema edema.  Neuro: Alert and oriented X 3. Psych:  anxious  mood.  answers questions appropriately.   Labs:   Lab Results  Component Value Date   WBC 18.7 (H) 01/26/2022   HGB 8.1 (L) 01/26/2022   HCT 25.3 (L) 01/26/2022   MCV 87.8 01/26/2022   PLT 542 (H) 01/26/2022    Recent Labs  Lab 01/12/2022 0313 01/25/22 0344 01/26/22 0540  NA 142   < > 144  K 4.1   < > 4.3  CL 116*   < > 115*  CO2 18*   < > 20*  BUN 17   < > 24*  CREATININE 1.06*   < > 1.16*  CALCIUM 8.6*   < > 8.6*  PROT 6.4*  --   --   BILITOT 0.5  --   --   ALKPHOS 116  --   --   ALT 10  --   --   AST 18  --   --   GLUCOSE 104*   < > 132*   < > = values in this interval not displayed.   Lab Results  Component Value Date   TROPONINI <0.03 08/13/2018    Lab Results  Component Value Date   CHOL 195 11/13/2021   CHOL 145 07/08/2017   Lab Results  Component Value Date   HDL 53 11/13/2021   HDL 42 07/08/2017   Lab Results  Component Value Date   LDLCALC 119 (H) 11/13/2021   LDLCALC 82 07/08/2017   Lab Results  Component Value Date   TRIG 129 11/13/2021   TRIG 105 07/08/2017   No results found for: "CHOLHDL" No results found for: "LDLDIRECT"    Radiology: DG ESOPHAGUS W SINGLE CM (SOL OR THIN BA)  Result Date: 01/25/2022 CLINICAL DATA:  Patient with history of hiatal hernia, moderate narrowing of the distal esophagus on UGI 2018, globus sensation with medications, coughing/choking when taking multi-vitamin today. Request for esophagram for further evaluation. EXAM: ESOPHAGUS/BARIUM SWALLOW/TABLET STUDY TECHNIQUE: Single contrast examination was performed using thin liquid barium. This exam was performed by Candiss Norse, PA-C, and was supervised and interpreted by Zetta Bills, MD FLUOROSCOPY: Radiation Exposure Index (as provided by the fluoroscopic device): 22.3 mGy Kerma COMPARISON:  CT angiography of the chest which was performed on January 24, 2022 FINDINGS: Limited  exam due to patient debility, she is unable to stand and experiences transient hypoxia when  rolled to the oblique position. Swallowing: Swallowing is grossly normal, not well assessed due to oblique position but with no signs of aspiration. Pharynx: Limited evaluation for reasons above. Esophagus: No obstructing lesion of the esophagus. No gross narrowing of the distal esophagus. Motility without signs of substantial stasis in head of bed slightly elevated RPO and LPO positions. Esophageal motility: Within normal limits given limited exam. Hiatal Hernia: None seen on this limited exam. Gastroesophageal reflux: None visualized on limited exam. Ingested 27m barium tablet: Patient refused barium tablet Other: None. IMPRESSION: Single contrast esophagram without evidence of esophageal stricture or aspiration given limited exam due to the patient's debilitated state and ongoing medical conditions related to respiratory distress. Electronically Signed   By: GZetta BillsM.D.   On: 01/25/2022 15:21   DG Chest Port 1 View  Result Date: 01/15/2022 CLINICAL DATA:  Status post right thoracentesis. EXAM: PORTABLE CHEST 1 VIEW COMPARISON:  Same day. FINDINGS: No pneumothorax is noted status post right thoracentesis. Right pleural effusion is significantly smaller. IMPRESSION: No pneumothorax status post right thoracentesis. Electronically Signed   By: JMarijo ConceptionM.D.   On: 01/17/2022 12:52   [place]ORACENTESIS ASP PLEURAL SPACE W/IMG GUIDE  Result Date: 01/16/2022 INDICATION: Patient with history of COPD, shortness of breath, right pleural effusion. Request is made for diagnostic and therapeutic right thoracentesis. EXAM: ULTRASOUND GUIDED DIAGNOSTIC AND THERAPEUTIC RIGHT THORACENTESIS MEDICATIONS: 10 mL 1% lidocaine COMPLICATIONS: None immediate. PROCEDURE: An ultrasound guided thoracentesis was thoroughly discussed with the patient and questions answered. The benefits, risks, alternatives and complications were also discussed. The patient understands and wishes to proceed with the procedure. Written  consent was obtained. Ultrasound was performed to localize and mark an adequate pocket of fluid in the right chest. The area was then prepped and draped in the normal sterile fashion. 1% Lidocaine was used for local anesthesia. Under ultrasound guidance a 6 Fr Safe-T-Centesis catheter was introduced. Thoracentesis was performed. The catheter was removed and a dressing applied. FINDINGS: A total of approximately 900 mL of clear, yellow fluid was removed. Samples were sent to the laboratory as requested by the clinical team. IMPRESSION: Successful ultrasound guided diagnostic and therapeutic right thoracentesis yielding 900 mL of pleural fluid. Read by: KBrynda GreathousePA-C Electronically Signed   By: MJerilynn Mages  Shick M.D.   On: 01/13/2022 12:17   ECHOCARDIOGRAM COMPLETE  Result Date: 01/23/2022    ECHOCARDIOGRAM REPORT   [facility]   [facility]am[facility] 01[facility]12/2022 Medical Rec #:  [id number]             Height:       62.0 in Accession #:    [id number]            Weight:       105.0 lb Date of Birth:  109-Mar-1937              BSA:          1.454 m Patient Age:    89years               BP:           102/81 mmHg Patient Gender: F                      HR:           114 bpm. Exam  Location:  [facility] Procedure: 2D Echo, Color Doppler and Cardiac Doppler Indications:     I50.31 congestive heart failure-Acute Diastolic  History:         Patient has prior history of Echocardiogram examinations. CAD;                  Risk Factors:Hypertension and Dyslipidemia.  Sonographer:     Charmayne Sheer Referring Phys:  Unknown Foley NIU Diagnosing Phys: Neoma Laming  Sonographer Comments: Suboptimal parasternal window. IMPRESSIONS  1. Left ventricular ejection fraction, by estimation, is 60 to 65%. The left ventricle has normal function. The left ventricle has no regional wall motion abnormalities. Left ventricular diastolic parameters are indeterminate.  2. Right ventricular systolic function is normal. The right ventricular size  is moderately enlarged.  3. Left atrial size was moderately dilated.  4. Right atrial size was moderately dilated.  5. The mitral valve is normal in structure. Mild to moderate mitral valve regurgitation. No evidence of mitral stenosis.  6. The aortic valve is normal in structure. Aortic valve regurgitation is mild to moderate. Aortic valve sclerosis/calcification is present, without any evidence of aortic stenosis.  7. The inferior vena cava is normal in size with greater than 50% respiratory variability, suggesting right atrial pressure of 3 mmHg. FINDINGS  Left Ventricle: Left ventricular ejection fraction, by estimation, is 60 to 65%. The left ventricle has normal function. The left ventricle has no regional wall motion abnormalities. The left ventricular internal cavity size was normal in size. There is  no left ventricular hypertrophy. Left ventricular diastolic parameters are indeterminate. Right Ventricle: The right ventricular size is moderately enlarged. No increase in right ventricular wall thickness. Right ventricular systolic function is normal. Left Atrium: Left atrial size was moderately dilated. Right Atrium: Right atrial size was moderately dilated. Pericardium: There is no evidence of pericardial effusion. Mitral Valve: The mitral valve is normal in structure. Mild to moderate mitral valve regurgitation. No evidence of mitral valve stenosis. Tricuspid Valve: The tricuspid valve is normal in structure. Tricuspid valve regurgitation is mild . No evidence of tricuspid stenosis. Aortic Valve: The aortic valve is normal in structure. Aortic valve regurgitation is mild to moderate. Aortic valve sclerosis/calcification is present, without any evidence of aortic stenosis. Aortic valve mean gradient measures 9.0 mmHg. Aortic valve peak gradient measures 15.7 mmHg. Aortic valve area, by VTI measures 1.37 cm. Pulmonic Valve: The pulmonic valve was normal in structure. Pulmonic valve regurgitation is not  visualized. No evidence of pulmonic stenosis. Aorta: The aortic root is normal in size and structure. Venous: The inferior vena cava is normal in size with greater than 50% respiratory variability, suggesting right atrial pressure of 3 mmHg. IAS/Shunts: No atrial level shunt detected by color flow Doppler.  LEFT VENTRICLE PLAX 2D LVIDd:         3.37 cm   Diastology LVIDs:         2.28 cm   LV e' medial:    4.57 cm/s LV PW:         0.78 cm   LV E/e' medial:  31.6 LV IVS:        0.70 cm   LV e' lateral:   6.20 cm/s LVOT diam:     1.80 cm   LV E/e' lateral: 23.3 LV SV:         53 LV SV Index:   37 LVOT Area:     2.54 cm  RIGHT VENTRICLE RV Basal diam:  3.32 cm LEFT ATRIUM  Index        RIGHT ATRIUM           Index LA diam:        3.80 cm 2.61 cm/m   RA Area:     12.20 cm LA Vol (A2C):   30.4 ml 20.91 ml/m  RA Volume:   26.90 ml  18.50 ml/m LA Vol (A4C):   55.0 ml 37.83 ml/m LA Biplane Vol: 42.1 ml 28.96 ml/m  AORTIC VALVE AV Area (Vmax):    1.48 cm AV Area (Vmean):   1.41 cm AV Area (VTI):     1.37 cm AV Vmax:           198.00 cm/s AV Vmean:          144.000 cm/s AV VTI:            0.390 m AV Peak Grad:      15.7 mmHg AV Mean Grad:      9.0 mmHg LVOT Vmax:         115.00 cm/s LVOT Vmean:        80.000 cm/s LVOT VTI:          0.210 m LVOT/AV VTI ratio: 0.54  AORTA Ao Root diam: 2.70 cm MITRAL VALVE                TRICUSPID VALVE MV Area (PHT): 3.65 cm     TR Peak grad:   40.7 mmHg MV Decel Time: 208 msec     TR Vmax:        319.00 cm/s MV E velocity: 144.33 cm/s                             SHUNTS                             Systemic VTI:  0.21 m                             Systemic Diam: 1.80 cm Neoma Laming Electronically signed by Neoma Laming Signature Date/Time: 01/14/2022/12:12:10 PM    Final    US Venous Img Lower Bilateral (DVT)  Result Date: 01/17/2022 CLINICAL DATA:  Bilateral lower extremity edema.  Evaluate for DVT. EXAM: BILATERAL LOWER EXTREMITY VENOUS DOPPLER ULTRASOUND TECHNIQUE:  Gray-scale sonography with graded compression, as well as color Doppler and duplex ultrasound were performed to evaluate the lower extremity deep venous systems from the level of the common femoral vein and including the common femoral, femoral, profunda femoral, popliteal and calf veins including the posterior tibial, peroneal and gastrocnemius veins when visible. The superficial great saphenous vein was also interrogated. Spectral Doppler was utilized to evaluate flow at rest and with distal augmentation maneuvers in the common femoral, femoral and popliteal veins. COMPARISON:  Left lower extremity venous Doppler ultrasound-12/17/2007 (negative). FINDINGS: RIGHT LOWER EXTREMITY Common Femoral Vein: No evidence of thrombus. Normal compressibility, respiratory phasicity and response to augmentation. Saphenofemoral Junction: No evidence of thrombus. Normal compressibility and flow on color Doppler imaging. Profunda Femoral Vein: No evidence of thrombus. Normal compressibility and flow on color Doppler imaging. Femoral Vein: No evidence of thrombus. Normal compressibility, respiratory phasicity and response to augmentation. Popliteal Vein: No evidence of thrombus. Normal compressibility, respiratory phasicity and response to augmentation. Calf Veins: No evidence of thrombus. Normal compressibility and flow on color Doppler imaging. Superficial  Great Saphenous Vein: No evidence of thrombus. Normal compressibility. Other Findings:  None. LEFT LOWER EXTREMITY Common Femoral Vein: No evidence of thrombus. Normal compressibility, respiratory phasicity and response to augmentation. Saphenofemoral Junction: No evidence of thrombus. Normal compressibility and flow on color Doppler imaging. Profunda Femoral Vein: No evidence of thrombus. Normal compressibility and flow on color Doppler imaging. Femoral Vein: No evidence of thrombus. Normal compressibility, respiratory phasicity and response to augmentation. Popliteal Vein: No  evidence of thrombus. Normal compressibility, respiratory phasicity and response to augmentation. Calf Veins: No evidence of thrombus. Normal compressibility and flow on color Doppler imaging. Superficial Great Saphenous Vein: No evidence of thrombus. Normal compressibility. Other Findings:  None. IMPRESSION: No evidence of DVT within either lower extremity. Electronically Signed   By: Sandi Mariscal M.D.   On: 01/15/2022 10:01   CT Angio Chest Pulmonary Embolism (PE) W or WO Contrast  Result Date: 01/09/2022 CLINICAL DATA:  Shortness of breath EXAM: CT ANGIOGRAPHY CHEST WITH CONTRAST TECHNIQUE: Multidetector CT imaging of the chest was performed using the standard protocol during bolus administration of intravenous contrast. Multiplanar CT image reconstructions and MIPs were obtained to evaluate the vascular anatomy. RADIATION DOSE REDUCTION: This exam was performed according to the departmental dose-optimization program which includes automated exposure control, adjustment of the mA and/or kV according to patient size and/or use of iterative reconstruction technique. CONTRAST:  48m OMNIPAQUE IOHEXOL 350 MG/ML SOLN COMPARISON:  Chest CT dated January 03, 2022 FINDINGS: Cardiovascular: No evidence of pulmonary embolus. Mild cardiomegaly. No pericardial effusion. Normal caliber thoracic aorta with severe atherosclerotic disease. Three-vessel coronary artery calcifications status post CABG. Mediastinum/Nodes: Mildly enlarged subcarinal lymph node, measuring up to 1.3 cm, unchanged in size when compared with prior exam, other subcentimeter mediastinal and hilar lymph nodes are seen which are unchanged in size. Patulous esophagus. Thyroid is unremarkable. Lungs/Pleura: Central airways are patent. Bilateral bronchial wall thickening. Moderate severe centrilobular emphysema. Moderate right and small left loculated pleural effusions, increased in size when compared with prior exam. No evidence of pleural thickening, however  contrast timing limits evaluation. Left upper lobe subpleural linear consolidation is unchanged when compared with prior. Upper Abdomen: Partially visualized simple appearing left renal cysts, no further follow-up imaging is recommended for this lesion. No acute abnormality. Musculoskeletal: Prior median sternotomy. Severe compression deformity of T10, unchanged when compared to prior exam. No aggressive appearing osseous lesions. Review of the MIP images confirms the above findings. IMPRESSION: 1. No evidence pulmonary embolus. 2. Moderate right and small left loculated pleural effusions, increased in size when compared to prior exam with new areas of loculation, concerning for empyemas. 3. Increased bilateral bronchial wall thickening, findings can be seen in the setting of bronchitis. 4. Left upper lobe subpleural linear consolidation is unchanged when compared with prior exam and possibly due to scarring, follow-up chest CT in 3 months is recommended to ensure stability. 5. Severe aortic Atherosclerosis (ICD10-I70.0) and Emphysema (ICD10-J43.9). Electronically Signed   By: LYetta GlassmanM.D.   On: 01/20/2022 09:42   DG Chest Portable 1 View  Result Date: 01/25/2022 CLINICAL DATA:  Shortness of breath. EXAM: PORTABLE CHEST 1 VIEW COMPARISON:  January 02, 2022 FINDINGS: Multiple sternal wires are noted. The heart size and mediastinal contours are within normal limits. There is marked severity calcification of the aortic arch. Mild to moderate severity, diffusely increased interstitial lung markings are seen. Moderate to marked severity areas of atelectasis and/or infiltrate are seen within the bilateral lung bases. There are moderate sized bilateral pleural  effusions. No pneumothorax is identified. The visualized skeletal structures are unremarkable. IMPRESSION: 1. Evidence of prior median sternotomy. 2. Mild to moderate severity interstitial edema. 3. Moderate to marked severity bibasilar atelectasis and/or  infiltrate. 4. Moderate sized bilateral pleural effusions. Electronically Signed   By: Virgina Norfolk M.D.   On: 01/05/2022 03:45   DG HIP UNILAT W OR W/O PELVIS 2-3 VIEWS LEFT  Result Date: 01/04/2022 CLINICAL DATA:  Left hip arthroplasty. EXAM: DG HIP (WITH OR WITHOUT PELVIS) 2-3V LEFT COMPARISON:  Left hip radiographs 01/02/2022 and intraoperative fluoroscopic images [id number] FINDINGS: Sequelae of left hip hemiarthroplasty are identified. The prosthesis appears well seated within the acetabulum. No acute fracture is identified. Skin staples and vascular stents are again noted including a focally kinked or narrowed appearance of one of the stents in the proximal to mid left thigh. IMPRESSION: Left hip hemiarthroplasty without evidence of acute osseous abnormality. Electronically Signed   By: Logan Bores M.D.   On: 01/04/2022 19:49   DG HIP UNILAT WITH PELVIS 1V LEFT  Result Date: 01/04/2022 CLINICAL DATA:  Known left hip fracture EXAM: DG HIP (WITH OR WITHOUT PELVIS) 1V*L* COMPARISON:  01/02/2022 FLUOROSCOPY TIME:  Radiation Exposure Index (as provided by the fluoroscopic device): 0.53 mGy If the device does not provide the exposure index: Fluoroscopy Time:  6 seconds Number of Acquired Images:  2 FINDINGS: Initial image again demonstrates the left femoral neck fracture. Subsequent image shows a left hip hemiarthroplasty satisfactory position. IMPRESSION: Left hip hemiarthroplasty. Electronically Signed   By: Inez Catalina M.D.   On: 01/04/2022 19:38   DG C-Arm 1-60 Min-No Report  Result Date: 01/04/2022 Fluoroscopy was utilized by the requesting physician.  No radiographic interpretation.   US RENAL  Result Date: 01/04/2022 CLINICAL DATA:  Acute kidney injury. EXAM: RENAL / URINARY TRACT ULTRASOUND COMPLETE COMPARISON:  CT abdomen pelvis dated August 13, 2018. FINDINGS: Right Kidney: Renal measurements: 8.4 x 3.4 x 4.4 cm = volume: 66 mL. Cortical thinning. Echogenicity within normal limits. No mass  or hydronephrosis visualized. 1.4 cm simple cyst. No follow-up imaging is recommended. Left Kidney: Renal measurements: 8.6 x 4.2 x 3.4 cm = volume: 63 mL. Cortical thinning. Echogenicity within normal limits. No mass or hydronephrosis visualized. Several simple cysts measuring up to 2.0 cm. No follow-up imaging is recommended. Bladder: Decompressed by Foley catheter. Other: None. IMPRESSION: 1. No acute abnormality. Bilateral renal atrophy/cortical thinning. Electronically Signed   By: Titus Dubin M.D.   On: 01/04/2022 10:47   CT CHEST WO CONTRAST  Result Date: 01/03/2022 CLINICAL DATA:  Respiratory illness. EXAM: CT CHEST WITHOUT CONTRAST TECHNIQUE: Multidetector CT imaging of the chest was performed following the standard protocol without IV contrast. RADIATION DOSE REDUCTION: This exam was performed according to the departmental dose-optimization program which includes automated exposure control, adjustment of the mA and/or kV according to patient size and/or use of iterative reconstruction technique. COMPARISON:  None Available. FINDINGS: Cardiovascular: There is marked severity calcification of the thoracic aorta, without evidence of aortic aneurysm. There is mild cardiomegaly with marked severity coronary artery calcification. No pericardial effusion. Mediastinum/Nodes: No enlarged mediastinal or axillary lymph nodes. Thyroid gland, trachea, and esophagus demonstrate no significant findings. Lungs/Pleura: There is marked severity emphysematous lung disease. A mild amount of ill-defined scarring, atelectasis and/or infiltrate is seen along the medial aspect of the left upper lobe. A small, approximately 18 mm x 7 mm area of adjacent pleural based low-attenuation is noted (axial CT image 23, CT series 2). A 2.5  cm x 1.0 cm x 0.9 cm area of low attenuation is seen within the anterior aspect of the left lower lobe,, adjacent to the inferior aspect of the oblique fissure (axial CT image 98, CT series  2/sagittal reformatted image 98, CT series 6). This area is not seen within the visualized portion of the left lung base on the prior abdomen and pelvis CT, dated August 13, 2018. Mild lingular scarring and posterior bibasilar atelectasis is seen. There are small bilateral pleural effusions. No pneumothorax is identified. Upper Abdomen: Bilateral simple renal cysts are seen. Musculoskeletal: Multiple sternal wires are present. A chronic compression fracture deformity is seen at the level of T10. Multilevel degenerative changes are noted throughout the thoracic spine. IMPRESSION: 1. Marked severity emphysematous lung disease. 2. Mild amount of ill-defined left upper lobe scarring, atelectasis and/or infiltrate, with adjacent area of pleural based low-attenuation. While this may represent focal pleural thickening, sequelae associated with an underlying neoplastic process cannot be excluded. Further evaluation with nuclear medicine PET/CT is recommended. 3. 2.5 cm x 1.0 cm x 0.9 cm area of low attenuation within the left lower lobe, as described above. Evaluation with a nuclear medicine PET/CT is again recommended to exclude the presence of a pulmonary neoplasm. 4. Small bilateral pleural effusions. 5. Bilateral simple renal cysts. 6. Chronic compression fracture deformity at the level of T10. Aortic Atherosclerosis (ICD10-I70.0) and Emphysema (ICD10-J43.9). Electronically Signed   By: Virgina Norfolk M.D.   On: 01/03/2022 20:02   DG Elbow 2 Views Left  Result Date: 01/03/2022 CLINICAL DATA:  Status post fall. EXAM: LEFT ELBOW - 2 VIEW COMPARISON:  None Available. FINDINGS: There is no evidence of fracture, dislocation, or joint effusion. There is no evidence of arthropathy or other focal bone abnormality. Mild to moderate severity posterior soft tissue swelling is seen. IMPRESSION: Posterior soft tissue swelling without evidence of acute osseous abnormality. Electronically Signed   By: Virgina Norfolk M.D.    On: 01/03/2022 19:47   DG Hip Unilat W or Wo Pelvis 2-3 Views Left  Result Date: 01/02/2022 CLINICAL DATA:  Recent fall with left hip pain, initial encounter EXAM: DG HIP (WITH OR WITHOUT PELVIS) 3V LEFT COMPARISON:  None Available. FINDINGS: Pelvic ring is intact. Diffuse vascular stenting is noted. Some apparent kinking of 1 of the stents is noted in the mid left thigh. This appears stable from 2014. Left femoral neck fracture is noted with mild impaction at the fracture site. IMPRESSION: Left femoral neck fracture with impaction at the fracture site. No other focal abnormality is seen. Kinked vascular stent within the superficial femoral artery on the left stable from 2014. Electronically Signed   By: Inez Catalina M.D.   On: 01/02/2022 23:08   CT Cervical Spine Wo Contrast  Result Date: 01/02/2022 CLINICAL DATA:  Status post fall. EXAM: CT CERVICAL SPINE WITHOUT CONTRAST TECHNIQUE: Multidetector CT imaging of the cervical spine was performed without intravenous contrast. Multiplanar CT image reconstructions were also generated. RADIATION DOSE REDUCTION: This exam was performed according to the departmental dose-optimization program which includes automated exposure control, adjustment of the mA and/or kV according to patient size and/or use of iterative reconstruction technique. COMPARISON:  None Available. FINDINGS: Alignment: Normal. Skull base and vertebrae: No acute fracture. No primary bone lesion or focal pathologic process. Soft tissues and spinal canal: No prevertebral fluid or swelling. No visible canal hematoma. Disc levels: Mild posterior endplate sclerosis is seen at the level of C4-C5. Mild anterior osteophyte formation is present at the  levels of C3-C4 and C4-C5. There is marked severity narrowing of the anterior atlantoaxial articulation. Marked severity posterior intervertebral disc space narrowing is seen at the levels of C2-C3, C3-C4 and C4-C5. Moderate severity changes are seen at the  levels of C5-C6 and C6-C7. Bilateral moderate severity multilevel facet joint hypertrophy is noted. Upper chest: There is marked severity emphysematous lung disease with mild atelectasis and/or infiltrate seen along the medial aspect of the left upper lobe. A small, approximately 2.0 cm x 0.6 cm area of adjacent pleural thickening is seen. Other: None. IMPRESSION: 1. No acute fracture or subluxation of the cervical spine. 2. Marked severity multilevel degenerative changes, as described above. 3. Marked severity emphysematous lung disease. 4. Mild left upper lobe atelectasis and/or infiltrate with an adjacent area focal pleural thickening. Correlation with outpatient chest CT is recommended Emphysema (ICD10-J43.9). Electronically Signed   By: Virgina Norfolk M.D.   On: 01/02/2022 23:07   DG Chest Portable 1 View  Result Date: 01/02/2022 CLINICAL DATA:  Recent trip and fall over CT with chest pain, initial encounter EXAM: PORTABLE CHEST 1 VIEW COMPARISON:  08/13/2018 FINDINGS: Cardiac shadow is stable. Postsurgical changes are again seen. Epicardial pacing wires are noted. Lungs are well aerated without focal infiltrate. No acute bony abnormality is noted. IMPRESSION: No acute abnormality noted. Electronically Signed   By: Inez Catalina M.D.   On: 01/02/2022 23:06   CT HEAD WO CONTRAST (5MM)  Result Date: 01/02/2022 CLINICAL DATA:  Status post fall. EXAM: CT HEAD WITHOUT CONTRAST TECHNIQUE: Contiguous axial images were obtained from the base of the skull through the vertex without intravenous contrast. RADIATION DOSE REDUCTION: This exam was performed according to the departmental dose-optimization program which includes automated exposure control, adjustment of the mA and/or kV according to patient size and/or use of iterative reconstruction technique. COMPARISON:  None Available. FINDINGS: Brain: There is mild cerebral atrophy with widening of the extra-axial spaces and ventricular dilatation. There are areas  of decreased attenuation within the white matter tracts of the supratentorial brain, consistent with microvascular disease changes. Small, bilateral chronic basal ganglia lacunar infarcts are seen. Vascular: No hyperdense vessel or unexpected calcification. Skull: Normal. Negative for fracture or focal lesion. Sinuses/Orbits: Moderate severity proximal left ethmoid sinus mucosal thickening is seen. Other: None. IMPRESSION: 1. No acute intracranial pathology. 2. Generalized cerebral atrophy with chronic white matter small vessel ischemia. Electronically Signed   By: Virgina Norfolk M.D.   On: 01/02/2022 22:57    ECHO 12/30/2021 LVEF 60-65% moderately enlarged RV, biatrial moderate dilation, mild to moderate MR, mild to moderate AR  TELEMETRY reviewed by me: Sinus rhythm overnight, converted to A-fib with RVR around 830 with rates peaking in the 130s, improved in the high 90s to low 110s during interview  EKG reviewed by me: Atrial fibrillation with RVR rate 125  ASSESSMENT AND PLAN:  Olivia Lane is an 89yoF with a PMH of CAD s/p CABG x4 (LIMA-LAD, SVG-OM1, SVG-diag, SVG-PDA 06/03/2013) with post op atrial fibrillation, COPD with history of tobacco use, with recent admission 7/4 - 7/9 after a mechanical fall, now s/p left hip hemiarthroplasty 7/6 who presented to [facility] ED 01/23/2022 with dry cough and progressive shortness of breath.  Cardiology is consulted for further assistance with her A-fib with RVR.  #COPD exacerbation #Paroxysmal atrial fibrillation with RVR The patient presents with 2 days of worsening shortness of breath, and an oxygen requirement above her recent baseline of 2 L.  She is being treated with empiric antibiotics for  a COPD exacerbation and has had intermittent atrial fibrillation over the past 2 days, however she became hypoxic with increased work of breathing the morning of 7/28 and became tachycardic in atrial fibrillation to the 130s refractory to IV pushes of diltiazem.  Per  chart review she has a history of postop CABG atrial fibrillation s/p TEE cardioversion and took amiodarone for a short period of time without known recurrence in her arrhythmia.  -Agree with treatment of underlying infection per primary team -Continue diltiazem gtt. for now, if her heart rate remains controlled in the high 90s to low 100s, can titrate off later this afternoon and convert to short acting diltiazem 65m q6h  -Switch metoprolol to tartrate 100 mg twice daily to 50 mg every 6 hours -Continue heparin GTT for anticoagulation, discussed risks and benefits in detail with starting DOAC for stroke risk reduction and the patient is agreeable to start Eliquis 2.5 mg twice daily closer to discharge.  She meets criteria for reduced dosing due to her age and weight.  #Acute on chronic HFpEF (LVEF 60 to 65%) -BNP was elevated at 900, chest x-ray with bilateral pleural effusions and clinical signs of volume overload on exam, suspect decompensation is in the setting of A-fib with RVR. -S/p right thoracentesis on 7/26 yielding 900 mL of yellow fluid -S/p IV Lasix 20 mg once daily x3 doses, will increase this to 40 mg for this afternoon and tomorrow morning and monitor response.  #CAD s/p CABG x4 in 2014 -Continue atorvastatin 40 mg,  -Discontinue clopidogrel 75 mg once daily and start aspirin 81 mg daily in anticipation of starting Eliquis to reduce the risk of bleeding.  This patient's plan of care was discussed and created with Dr. PSaralyn Pilarand he is in agreement.  Signed: LTristan Schroeder, PA-C 01/26/2022, 10:46 AM [facility] CenterCardiology

## 2022-01-26 NOTE — Care Management Important Message (Signed)
Important Message  Patient Details  Name: Olivia Lane MRN: [id number] Date of Birth: Nov 18, 1935   Medicare Important Message Given:  Yes     Dannette Barbara 01/26/2022, 11:03 AM

## 2022-01-26 NOTE — Consult Note (Signed)
ANTICOAGULATION CONSULT NOTE - Follow Up Consult  Pharmacy Consult for heparin gtt Indication: atrial fibrillation  No Known Allergies  Patient Measurements: Height: '5\' 2"'$  (157.5 cm) Weight: 47.4 kg (104 lb 9.6 oz) IBW/kg (Calculated) : 50.1 Heparin Dosing Weight: 47.6kg  Vital Signs: Temp: 98.4 F (36.9 C) (07/28 2011) Temp Source: Oral (07/28 1956) BP: 108/64 (07/28 2011) Pulse Rate: 105 (07/28 2011)  Labs: Recent Labs    01/07/2022 0313 01/14/2022 0454 01/25/22 0344 01/25/22 1518 01/26/22 0540 01/26/22 0939 01/26/22 1808 01/26/22 1950 01/26/22 2119  HGB 8.7*  --  7.7*  --  8.1*  --   --   --  7.9*  HCT 28.8*  --  24.9*  --  25.3*  --   --   --  25.5*  PLT 683*  --  571*  --  542*  --   --   --  608*  APTT  --   --   --   --   --  21*  --   --   --   LABPROT  --   --   --  16.1*  --   --   --   --   --   INR  --   --   --  1.3*  --   --   --   --   --   HEPARINUNFRC  --   --   --   --   --   --  >1.10* >1.10*  --   CREATININE 1.06*  --  1.23*  --  1.16*  --   --   --   --   TROPONINIHS 13 14  --   --   --   --   --   --   --      Estimated Creatinine Clearance: 27 mL/min (A) (by C-G formula based on SCr of 1.16 mg/dL (H)).   Medications: Heparin Dosing Weight: 47.6kg PTA: plavix '75mg'$  QD Inpatient: LMWH '30mg'$  q24h >> heparin gtt,  plavix '75mg'$  QD  Assessment: 86yo F w/ h/o COPD, post-op Afib (no chronic AC per pt preference, h/o VKA), diaCHF, HLD, CAD (CABG x4), PVD, HTN, IDA, & CKD3 presenting to ED with SOB and admitted for COPDE & CAP. Pharmacy consulted for mgmt of heparin gtt.   Date Time aPTT/HL Rate/Comment 7/28    18:08    >1.10 700 units/hr, ? Lab error, will redraw 7/28  19:50    >1.10 700 units/hr   Baseline Labs: aPTT - pending INR - 1.3 Hgb - 7.7>8.1 Plts - [phone]  Goal of Therapy:  Heparin level 0.3-0.7 units/ml Monitor platelets by anticoagulation protocol: Yes   Plan:  Heparin level returned supratherapeutic, patient had not been  on oral anticoagulants, question if lab was drawn correctly. Had level repeated and came back supratherapeutic a 2nd time. Told RN to hold Heparin drip for 1 hour Will restart at reduced rate of 550 units/hr Recheck HL in 8 hours. Continue to monitor H&H and platelets daily while on heparin drip.  Paulina Fusi, PharmD, BCPS 01/26/2022 9:45 PM

## 2022-01-26 NOTE — Assessment & Plan Note (Signed)
Discussed CODE STATUS with patient, advised given her advanced COPD and history of heart problems, she would be very unlikely to survive cardiac resuscitation/chest compressions or to come off of the ventilator if she was needing that for life support.  Patient states at this point she is not sure how she feels about all that, "would want everything done just to try" she does not have been instructed but she names her son Olivia Lane") as decision-maker if needed

## 2022-01-26 NOTE — Progress Notes (Signed)
PROGRESS NOTE    Olivia Lane  HQI:[id number] DOB: June 27, 1936  DOA: 01/25/2022 Date of Service: 01/26/22 PCP: Jonetta Osgood, NP     Brief Narrative / Hospital Course:  Olivia Lane is a 86 y.o. female with medical history significant of COPD on 3 L oxygen, hypertension, hyperlipidemia, GERD, anemia, CAD, CABG, postoperative atrial fibrillation not on anticoagulants, CKD-CAD, who presents to ED 01/05/2022 with shortness of breath. 07/26: Admitted for COPD exacerbation. (+)SIRS w/ HR>90, RR>20 but was afebrile and no leukocytosis. Lactic acid 1.8. Loculated pleural effusion, cannot r/o empyema so possible sepsis pending thoracentesis. Treating for CAP w/ azithromycin and ceftriaxone. Thoracentesis yielded likely transudate effusion based on Light's criteria  07/27: WBC increased likely d/t steroids. AFib RVR not responding to IV pushes metoprolol but converted to Afib reg rate w/ cardizem gtt. RN had concerns for choking episode, SLP evaluated and recommended barium swallow study which showed no concerns, po meds restarted. Has asymmetric leg edema with trace right leg edema and 1+ left leg edema, small area of erythema on 3rd toe.  07/28: SOB in AM, improved on NRB and w/ neb tx. Back into AFib RVR and restarted diltizaem bolus/drip, started heparin, consulted cardiology.  Will try tapering off diltiazem drip later today, plan for Eliquis, decreased Lasix   Consultants:  none  Procedures: none    Subjective: Patient reports subjective shortness of breath, increased WOB, feeling like she just cannot get a deep breath.  Denies chest pain.  Appears anxious.     ASSESSMENT & PLAN:   Principal Problem:   COPD exacerbation ([place]) Active Problems:   Acute diastolic congestive heart failure (HCC)   Chronic respiratory failure with hypoxia (HCC)   Coronary artery disease   Essential hypertension   Dyslipidemia   Hx Postoperative atrial fibrillation (HCC)   Stage  3b chronic kidney disease (CKD) (HCC)   Iron deficiency anemia   Protein-calorie malnutrition, moderate (HCC)   Pleural effusion   Atrial fibrillation with RVR ([place])   Advanced care planning/counseling discussion   COPD exacerbation ([place]) SIRS due to COPD exacerbation Possible sepsis d/t CAP Patient admits criteria for SIRS/sepsis with heart rate 94 and RR 25.  Lactic acid is 1.8.  Temperature normal, WBC 10.7.   Patient also has loculated pleural effusion, cannot completely rule out empyema though appears to be transudate and procalcitonin not indicative of respiratory bacterial infection  will admit to PCU as inpatient Bronchodilators Solu-Medrol 40 mg IV bid IV antibiotics: Rocephin and azithromycin Mucinex for cough  Incentive spirometry sputum culture Nasal cannula oxygen as needed to maintain O2 saturation 93% or greater Check procalcitonin level --> < 0.10 IV fluid: Patient received 1 L of normal saline  Acute diastolic congestive heart failure (HCC) BNP is elevated 912 and chest x-ray showed interstitial pulmonary edema, indicating possible acute CHF.  2D echo today showed EF 60 to 65%, with indeterminant diastolic dysfunction.  Clinically patient seems to have acute dCHF. Per cardiology: S/p IV Lasix 20 mg once daily x3 doses, will increase this to 40 mg for this afternoon and tomorrow morning and monitor response. Daily weights strict I/O's Low salt diet Fluid restriction Obtain REDs Vest reading  Atrial fibrillation with RVR ([place]) On admission, patient has a sinus rhythm, with frequent PAC.  Went into A-fib RVR 01/25/2022, initially on diltiazem drip which was tapered off and then needed to be restarted 01/26/2022 at which point cardiology was consulted Continue diltiazem for now, if remaining controlled in 90s to 100s, can  titrate off later today and convert to short acting diltiazem 60 mg every 6 hours switching metoprolol to tartrate 100 mg twice daily,  continue  heparin drip patient is agreeable to start Eliquis 2.5 twice daily  Chronic respiratory failure with hypoxia (HCC) on 3 L nasal cannula oxygen currently, saturation 95%  Coronary artery disease S/p of CABG. no chest pain.  Troponin level 13, 14. Lipitor, Discontinue clopidogrel 75 mg once daily and start aspirin 81 mg daily in anticipation of starting Eliquis for afib, to reduce the risk of bleeding. As needed nitroglycerin  Essential hypertension IV hydralazine as needed Metoprolol, amlodipine  Dyslipidemia Lipitor  Hx Postoperative atrial fibrillation ([place]) Patient reports was previously on Coumadin but does not want to be on blood thinners again, see A-fib/RVR  Stage 3b chronic kidney disease (CKD) ([place]) Renal function close to baseline.  Creatinine 1.06, BUN 17 Follow-up with BMP  Iron deficiency anemia Hemoglobin dropped slightly 8.7 --> 8.1 (8.9 on 01/07/2022) Follow-up with CBC  Protein-calorie malnutrition, moderate (HCC) Body weight 47.6 kg, BMI 19.20 Ensure Nutrition consult  Pleural effusion Thoracentesis was performed by IR, 900 of fluid was removed from right side f/u fluid culture and analysis Thus far indicates transudate  Advanced care planning/counseling discussion Discussed CODE STATUS with patient, advised given her advanced COPD and history of heart problems, she would be very unlikely to survive cardiac resuscitation/chest compressions or to come off of the ventilator if she was needing that for life support.  Patient states at this point she is not sure how she feels about all that, "would want everything done just to try" she does not have been instructed but she names her son Billey Gosling") as decision-maker if needed      DVT prophylaxis: lovenox Code Status: FULL Family Communication: We will call son later today, will update this note if I am able to reach him Disposition Plan / TOC needs: Anticipate discharge home but may need home health,  depending on clinical improvement may need PT/OT to eval for HH/SNF once more stable, today is Friday, anticipate she will be here through the weekend Barriers to discharge / significant pending items: pending improvement/stabilization of above issues             Objective: Vitals:   01/26/22 0911 01/26/22 1000 01/26/22 1100 01/26/22 1126  BP:  114/60 122/82 111/65  Pulse: (!) 104 (!) 118 (!) 115   Resp: 15 (!) 21 (!) 28 (!) 26  Temp:   (!) 97.4 F (36.3 C) (!) 97.3 F (36.3 C)  TempSrc:      SpO2: 90% (!) 84% (!) 89% 91%  Weight:      Height:        Intake/Output Summary (Last 24 hours) at 01/26/2022 1316 Last data filed at 01/26/2022 8850 Gross per 24 hour  Intake 854.14 ml  Output --  Net 854.14 ml    Filed Weights   01/25/2022 0305 01/26/22 0500  Weight: 47.6 kg 47.4 kg    Examination:  Constitutional:  VS as above General Appearance: alert, NAD Ears, Nose, Mouth, Throat: Normal appearance Normal external auditory canal and TM bilaterally MMM, posterior pharynx without erythema/exudate Neck: No masses, trachea midline No thyroid enlargement/tenderness/mass appreciated Respiratory: Increased  respiratory effort Breath sounds diminished all fields but especially LLL Cardiovascular: S1/S2 normal on reexam in PM No lower extremity edema Gastrointestinal: Nontender, no masses No hernia appreciated Musculoskeletal: No clubbing/cyanosis of digits Neurological: No cranial nerve deficit on limited exam Psychiatric: Normal judgment/insight  Normal mood and affect       Scheduled Medications:   [START ON 2022/02/06] aspirin  81 mg Oral Daily   atorvastatin  40 mg Oral QHS   feeding supplement  237 mL Oral BID BM   ferrous [id number] C-folic acid  1 capsule Oral BID PC   furosemide  40 mg Intravenous BID   ipratropium-albuterol  3 mL Nebulization Q6H   methylPREDNISolone (SOLU-MEDROL) injection  40 mg Intravenous Q12H   metoprolol  tartrate  50 mg Oral Q6H   mometasone-formoterol  2 puff Inhalation BID   multivitamin with minerals  1 tablet Oral Daily   pantoprazole  40 mg Oral BID    Continuous Infusions:  azithromycin (ZITHROMAX) 500 mg in sodium chloride 0.9 % 250 mL IVPB 500 mg (01/26/22 0546)   cefTRIAXone (ROCEPHIN)  IV 1 g (01/26/22 0444)   diltiazem (CARDIZEM) infusion 5 mg/hr (01/26/22 0838)   heparin 700 Units/hr (01/26/22 0946)    PRN Medications:  albuterol, hydrALAZINE, methocarbamol, nitroGLYCERIN, [DISCONTINUED] ondansetron **OR** ondansetron (ZOFRAN) IV, mouth rinse, oxyCODONE, promethazine  Antimicrobials:  Anti-infectives (From admission, onward)    Start     Dose/Rate Route Frequency Ordered Stop   01/25/22 1000  azithromycin (ZITHROMAX) tablet 250 mg  Status:  Discontinued        250 mg Oral Daily 01/15/2022 0852 01/17/2022 1001   01/25/22 0500  azithromycin (ZITHROMAX) 500 mg in sodium chloride 0.9 % 250 mL IVPB        500 mg 250 mL/hr over 400 Minutes Intravenous Every 24 hours 01/18/2022 1003     01/25/22 0500  cefTRIAXone (ROCEPHIN) 1 g in sodium chloride 0.9 % 100 mL IVPB        1 g 200 mL/hr over 30 Minutes Intravenous Every 24 hours 01/11/2022 1003     01/09/2022 0600  cefTRIAXone (ROCEPHIN) 1 g in sodium chloride 0.9 % 100 mL IVPB  Status:  Discontinued        1 g 200 mL/hr over 30 Minutes Intravenous Every 24 hours 01/15/2022 0452 01/06/2022 0851   01/15/2022 0445  cefTRIAXone (ROCEPHIN) 1 g in sodium chloride 0.9 % 100 mL IVPB  Status:  Discontinued        1 g 200 mL/hr over 30 Minutes Intravenous  Once 01/09/2022 0433 01/16/2022 0500   01/10/2022 0445  azithromycin (ZITHROMAX) 500 mg in sodium chloride 0.9 % 250 mL IVPB        500 mg 250 mL/hr over 60 Minutes Intravenous  Once 01/03/2022 0433 01/22/2022 0718       Data Reviewed: I have personally reviewed following labs and imaging studies  CBC: Recent Labs  Lab 01/29/2022 0313 01/25/22 0344 01/26/22 0540  WBC 10.7* 18.2* 18.7*  NEUTROABS  6.8  --   --   HGB 8.7* 7.7* 8.1*  HCT 28.8* 24.9* 25.3*  MCV 89.7 88.9 87.8  PLT 683* 571* 542*    Basic Metabolic Panel: Recent Labs  Lab 01/05/2022 0313 01/25/22 0344 01/26/22 0540 01/26/22 0548  NA 142 141 144  --   K 4.1 4.4 4.3  --   CL 116* 116* 115*  --   CO2 18* 20* 20*  --   GLUCOSE 104* 146* 132*  --   BUN 17 20 24*  --   CREATININE 1.06* 1.23* 1.16*  --   CALCIUM 8.6* 8.4* 8.6*  --   MG 2.3  --   --  2.2    GFR: Estimated Creatinine Clearance: 27 mL/min (A) (by  C-G formula based on SCr of 1.16 mg/dL (H)). Liver Function Tests: Recent Labs  Lab 01/01/2022 0313  AST 18  ALT 10  ALKPHOS 116  BILITOT 0.5  PROT 6.4*  ALBUMIN 2.8*    No results for input(s): "LIPASE", "AMYLASE" in the last 168 hours. No results for input(s): "AMMONIA" in the last 168 hours. Coagulation Profile: Recent Labs  Lab 01/25/22 1518  INR 1.3*    Cardiac Enzymes: No results for input(s): "CKTOTAL", "CKMB", "CKMBINDEX", "TROPONINI" in the last 168 hours. BNP (last 3 results) No results for input(s): "PROBNP" in the last 8760 hours. HbA1C: No results for input(s): "HGBA1C" in the last 72 hours. CBG: No results for input(s): "GLUCAP" in the last 168 hours. Lipid Profile: No results for input(s): "CHOL", "HDL", "LDLCALC", "TRIG", "CHOLHDL", "LDLDIRECT" in the last 72 hours. Thyroid Function Tests: No results for input(s): "TSH", "T4TOTAL", "FREET4", "T3FREE", "THYROIDAB" in the last 72 hours. Anemia Panel: No results for input(s): "VITAMINB12", "FOLATE", "FERRITIN", "TIBC", "IRON", "RETICCTPCT" in the last 72 hours. Urine analysis:    Component Value Date/Time   COLORURINE YELLOW (A) 01/07/2022 1839   APPEARANCEUR CLEAR (A) 01/14/2022 1839   APPEARANCEUR Clear 03/23/2019 0957   LABSPEC 1.018 12/30/2021 1839   PHURINE 5.0 01/13/2022 1839   GLUCOSEU NEGATIVE 01/13/2022 1839   HGBUR NEGATIVE 01/03/2022 1839   BILIRUBINUR NEGATIVE 01/11/2022 1839   BILIRUBINUR Negative  03/23/2019 0957   KETONESUR NEGATIVE 01/04/2022 1839   PROTEINUR NEGATIVE 01/07/2022 1839   NITRITE NEGATIVE 01/20/2022 1839   LEUKOCYTESUR MODERATE (A) 01/08/2022 1839   Sepsis Labs: '@LABRCNTIP'$ (procalcitonin:4,lacticidven:4)  Recent Results (from the past 240 hour(s))  SARS Coronavirus 2 by RT PCR (hospital order, performed in [place] hospital lab) *cepheid single result test* Anterior Nasal Swab     Status: None   Collection Time: 01/17/2022  3:17 AM   Specimen: Anterior Nasal Swab  Result Value Ref Range Status   SARS Coronavirus 2 by RT PCR NEGATIVE NEGATIVE Final    Comment: (NOTE) SARS-CoV-2 target nucleic acids are NOT DETECTED.  The SARS-CoV-2 RNA is generally detectable in upper and lower respiratory specimens during the acute phase of infection. The lowest concentration of SARS-CoV-2 viral copies this assay can detect is 250 copies / mL. A negative result does not preclude SARS-CoV-2 infection and should not be used as the sole basis for treatment or other patient management decisions.  A negative result may occur with improper specimen collection / handling, submission of specimen other than nasopharyngeal swab, presence of viral mutation(s) within the areas targeted by this assay, and inadequate number of viral copies (<250 copies / mL). A negative result must be combined with clinical observations, patient history, and epidemiological information.  Fact Sheet for Patients:   https://www.patel.info/  Fact Sheet for Healthcare Providers: https://hall.com/  This test is not yet approved or  cleared by the [place] FDA and has been authorized for detection and/or diagnosis of SARS-CoV-2 by FDA under an Emergency Use Authorization (EUA).  This EUA will remain in effect (meaning this test can be used) for the duration of the COVID-19 declaration under Section 564(b)(1) of the Act, 21 U.S.C. section 360bbb-3(b)(1), unless  the authorization is terminated or revoked sooner.  Performed at [facility], [address] [place], [place] [address]   Blood culture (routine x 2)     Status: None (Preliminary result)   Collection Time: 01/12/2022  4:54 AM   Specimen: BLOOD  Result Value Ref Range Status   Specimen Description  BLOOD RIGHT AC  Final   Special Requests   Final    BOTTLES DRAWN AEROBIC AND ANAEROBIC Blood Culture adequate volume   Culture   Final    NO GROWTH 2 DAYS Performed at [facility], [address] [place], [place] [address]    Report Status PENDING  Incomplete  Blood culture (routine x 2)     Status: None (Preliminary result)   Collection Time: 01/29/2022  4:54 AM   Specimen: BLOOD  Result Value Ref Range Status   Specimen Description BLOOD LEFT [facility]  Final   Special Requests   Final    BOTTLES DRAWN AEROBIC AND ANAEROBIC Blood Culture adequate volume   Culture   Final    NO GROWTH 2 DAYS Performed at [facility], [address], [place], [place] [address]    Report Status PENDING  Incomplete  Body fluid culture w Gram Stain     Status: None (Preliminary result)   Collection Time: 01/23/2022 12:05 PM   Specimen: PATH Cytology Pleural fluid  Result Value Ref Range Status   Specimen Description   Final    PLEURAL Performed at [facility], [address] [place], [place] [address]    Special Requests   Final    PLEURAL Performed at [facility], [address] [place], [place] [address]    Gram Stain NO WBC SEEN NO ORGANISMS SEEN   Final   Culture   Final    NO GROWTH 2 DAYS Performed at [facility], [place] [address] [place], [place] [address]    Report Status PENDING  Incomplete         Radiology Studies last 96 hours: DG ESOPHAGUS W SINGLE CM (SOL OR THIN BA)  Result Date: 01/25/2022 CLINICAL DATA:  Patient with history of hiatal hernia, moderate narrowing of the distal esophagus on UGI 2018, globus sensation  with medications, coughing/choking when taking multi-vitamin today. Request for esophagram for further evaluation. EXAM: ESOPHAGUS/BARIUM SWALLOW/TABLET STUDY TECHNIQUE: Single contrast examination was performed using thin liquid barium. This exam was performed by Candiss Norse, PA-C, and was supervised and interpreted by Zetta Bills, MD FLUOROSCOPY: Radiation Exposure Index (as provided by the fluoroscopic device): 22.3 mGy Kerma COMPARISON:  CT angiography of the chest which was performed on January 24, 2022 FINDINGS: Limited exam due to patient debility, she is unable to stand and experiences transient hypoxia when rolled to the oblique position. Swallowing: Swallowing is grossly normal, not well assessed due to oblique position but with no signs of aspiration. Pharynx: Limited evaluation for reasons above. Esophagus: No obstructing lesion of the esophagus. No gross narrowing of the distal esophagus. Motility without signs of substantial stasis in head of bed slightly elevated RPO and LPO positions. Esophageal motility: Within normal limits given limited exam. Hiatal Hernia: None seen on this limited exam. Gastroesophageal reflux: None visualized on limited exam. Ingested 66m barium tablet: Patient refused barium tablet Other: None. IMPRESSION: Single contrast esophagram without evidence of esophageal stricture or aspiration given limited exam due to the patient's debilitated state and ongoing medical conditions related to respiratory distress. Electronically Signed   By: GZetta BillsM.D.   On: 01/25/2022 15:21   DG Chest Port 1 View  Result Date: 12/31/2021 CLINICAL DATA:  Status post right thoracentesis. EXAM: PORTABLE CHEST 1 VIEW COMPARISON:  Same day. FINDINGS: No pneumothorax is noted status post right thoracentesis. Right pleural effusion is significantly smaller. IMPRESSION: No pneumothorax status post right thoracentesis. Electronically Signed  By: Marijo Conception M.D.   On: 01/19/2022 12:52    US THORACENTESIS ASP PLEURAL SPACE W/IMG GUIDE  Result Date: 01/18/2022 INDICATION: Patient with history of COPD, shortness of breath, right pleural effusion. Request is made for diagnostic and therapeutic right thoracentesis. EXAM: ULTRASOUND GUIDED DIAGNOSTIC AND THERAPEUTIC RIGHT THORACENTESIS MEDICATIONS: 10 mL 1% lidocaine COMPLICATIONS: None immediate. PROCEDURE: An ultrasound guided thoracentesis was thoroughly discussed with the patient and questions answered. The benefits, risks, alternatives and complications were also discussed. The patient understands and wishes to proceed with the procedure. Written consent was obtained. Ultrasound was performed to localize and mark an adequate pocket of fluid in the right chest. The area was then prepped and draped in the normal sterile fashion. 1% Lidocaine was used for local anesthesia. Under ultrasound guidance a 6 Fr Safe-T-Centesis catheter was introduced. Thoracentesis was performed. The catheter was removed and a dressing applied. FINDINGS: A total of approximately 900 mL of clear, yellow fluid was removed. Samples were sent to the laboratory as requested by the clinical team. IMPRESSION: Successful ultrasound guided diagnostic and therapeutic right thoracentesis yielding 900 mL of pleural fluid. Read by: Brynda Greathouse PA-C Electronically Signed   By: Jerilynn Mages.  Shick M.D.   On: 01/10/2022 12:17   ECHOCARDIOGRAM COMPLETE  Result Date: 01/23/2022    ECHOCARDIOGRAM REPORT   Patient Name:   CHARRON COULTAS [facility] Date of Exam: 01/17/2022 Medical Rec #:  [id number]              Height:       62.0 in Accession #:    [id number]             Weight:       105.0 lb Date of Birth:  1936/02/04               BSA:          1.454 m Patient Age:    57 years               BP:           102/81 mmHg Patient Gender: F                      HR:           114 bpm. Exam Location:  [facility] Procedure: 2D Echo, Color Doppler and Cardiac Doppler Indications:     I50.31 congestive heart  failure-Acute Diastolic  History:         Patient has prior history of Echocardiogram examinations. CAD;                  Risk Factors:Hypertension and Dyslipidemia.  Sonographer:     Charmayne Sheer Referring Phys:  Unknown Foley NIU Diagnosing Phys: Neoma Laming  Sonographer Comments: Suboptimal parasternal window. IMPRESSIONS  1. Left ventricular ejection fraction, by estimation, is 60 to 65%. The left ventricle has normal function. The left ventricle has no regional wall motion abnormalities. Left ventricular diastolic parameters are indeterminate.  2. Right ventricular systolic function is normal. The right ventricular size is moderately enlarged.  3. Left atrial size was moderately dilated.  4. Right atrial size was moderately dilated.  5. The mitral valve is normal in structure. Mild to moderate mitral valve regurgitation. No evidence of mitral stenosis.  6. The aortic valve is normal in structure. Aortic valve regurgitation is mild to moderate. Aortic valve sclerosis/calcification is present, without any evidence of aortic stenosis.  7. The inferior vena cava  is normal in size with greater than 50% respiratory variability, suggesting right atrial pressure of 3 mmHg. FINDINGS  Left Ventricle: Left ventricular ejection fraction, by estimation, is 60 to 65%. The left ventricle has normal function. The left ventricle has no regional wall motion abnormalities. The left ventricular internal cavity size was normal in size. There is  no left ventricular hypertrophy. Left ventricular diastolic parameters are indeterminate. Right Ventricle: The right ventricular size is moderately enlarged. No increase in right ventricular wall thickness. Right ventricular systolic function is normal. Left Atrium: Left atrial size was moderately dilated. Right Atrium: Right atrial size was moderately dilated. Pericardium: There is no evidence of pericardial effusion. Mitral Valve: The mitral valve is normal in structure. Mild to moderate  mitral valve regurgitation. No evidence of mitral valve stenosis. Tricuspid Valve: The tricuspid valve is normal in structure. Tricuspid valve regurgitation is mild . No evidence of tricuspid stenosis. Aortic Valve: The aortic valve is normal in structure. Aortic valve regurgitation is mild to moderate. Aortic valve sclerosis/calcification is present, without any evidence of aortic stenosis. Aortic valve mean gradient measures 9.0 mmHg. Aortic valve peak gradient measures 15.7 mmHg. Aortic valve area, by VTI measures 1.37 cm. Pulmonic Valve: The pulmonic valve was normal in structure. Pulmonic valve regurgitation is not visualized. No evidence of pulmonic stenosis. Aorta: The aortic root is normal in size and structure. Venous: The inferior vena cava is normal in size with greater than 50% respiratory variability, suggesting right atrial pressure of 3 mmHg. IAS/Shunts: No atrial level shunt detected by color flow Doppler.  LEFT VENTRICLE PLAX 2D LVIDd:         3.37 cm   Diastology LVIDs:         2.28 cm   LV e' medial:    4.57 cm/s LV PW:         0.78 cm   LV E/e' medial:  31.6 LV IVS:        0.70 cm   LV e' lateral:   6.20 cm/s LVOT diam:     1.80 cm   LV E/e' lateral: 23.3 LV SV:         53 LV SV Index:   37 LVOT Area:     2.54 cm  RIGHT VENTRICLE RV Basal diam:  3.32 cm LEFT ATRIUM             Index        RIGHT ATRIUM           Index LA diam:        3.80 cm 2.61 cm/m   RA Area:     12.20 cm LA Vol (A2C):   30.4 ml 20.91 ml/m  RA Volume:   26.90 ml  18.50 ml/m LA Vol (A4C):   55.0 ml 37.83 ml/m LA Biplane Vol: 42.1 ml 28.96 ml/m  AORTIC VALVE AV Area (Vmax):    1.48 cm AV Area (Vmean):   1.41 cm AV Area (VTI):     1.37 cm AV Vmax:           198.00 cm/s AV Vmean:          144.000 cm/s AV VTI:            0.390 m AV Peak Grad:      15.7 mmHg AV Mean Grad:      9.0 mmHg LVOT Vmax:         115.00 cm/s LVOT Vmean:        80.000 cm/s  LVOT VTI:          0.210 m LVOT/AV VTI ratio: 0.54  AORTA Ao Root diam: 2.70  cm MITRAL VALVE                TRICUSPID VALVE MV Area (PHT): 3.65 cm     TR Peak grad:   40.7 mmHg MV Decel Time: 208 msec     TR Vmax:        319.00 cm/s MV E velocity: 144.33 cm/s                             SHUNTS                             Systemic VTI:  0.21 m                             Systemic Diam: 1.80 cm Neoma Laming Electronically signed by Neoma Laming Signature Date/Time: 01/25/2022/12:12:10 PM    Final    US Venous Img Lower Bilateral (DVT)  Result Date: 01/06/2022 CLINICAL DATA:  Bilateral lower extremity edema.  Evaluate for DVT. EXAM: BILATERAL LOWER EXTREMITY VENOUS DOPPLER ULTRASOUND TECHNIQUE: Gray-scale sonography with graded compression, as well as color Doppler and duplex ultrasound were performed to evaluate the lower extremity deep venous systems from the level of the common femoral vein and including the common femoral, femoral, profunda femoral, popliteal and calf veins including the posterior tibial, peroneal and gastrocnemius veins when visible. The superficial great saphenous vein was also interrogated. Spectral Doppler was utilized to evaluate flow at rest and with distal augmentation maneuvers in the common femoral, femoral and popliteal veins. COMPARISON:  Left lower extremity venous Doppler ultrasound-12/17/2007 (negative). FINDINGS: RIGHT LOWER EXTREMITY Common Femoral Vein: No evidence of thrombus. Normal compressibility, respiratory phasicity and response to augmentation. Saphenofemoral Junction: No evidence of thrombus. Normal compressibility and flow on color Doppler imaging. Profunda Femoral Vein: No evidence of thrombus. Normal compressibility and flow on color Doppler imaging. Femoral Vein: No evidence of thrombus. Normal compressibility, respiratory phasicity and response to augmentation. Popliteal Vein: No evidence of thrombus. Normal compressibility, respiratory phasicity and response to augmentation. Calf Veins: No evidence of thrombus. Normal compressibility and  flow on color Doppler imaging. Superficial Great Saphenous Vein: No evidence of thrombus. Normal compressibility. Other Findings:  None. LEFT LOWER EXTREMITY Common Femoral Vein: No evidence of thrombus. Normal compressibility, respiratory phasicity and response to augmentation. Saphenofemoral Junction: No evidence of thrombus. Normal compressibility and flow on color Doppler imaging. Profunda Femoral Vein: No evidence of thrombus. Normal compressibility and flow on color Doppler imaging. Femoral Vein: No evidence of thrombus. Normal compressibility, respiratory phasicity and response to augmentation. Popliteal Vein: No evidence of thrombus. Normal compressibility, respiratory phasicity and response to augmentation. Calf Veins: No evidence of thrombus. Normal compressibility and flow on color Doppler imaging. Superficial Great Saphenous Vein: No evidence of thrombus. Normal compressibility. Other Findings:  None. IMPRESSION: No evidence of DVT within either lower extremity. Electronically Signed   By: Sandi Mariscal M.D.   On: 01/29/2022 10:01   CT Angio Chest Pulmonary Embolism (PE) W or WO Contrast  Result Date: 01/09/2022 CLINICAL DATA:  Shortness of breath EXAM: CT ANGIOGRAPHY CHEST WITH CONTRAST TECHNIQUE: Multidetector CT imaging of the chest was performed using the standard protocol during bolus administration of intravenous contrast. Multiplanar CT image reconstructions  and MIPs were obtained to evaluate the vascular anatomy. RADIATION DOSE REDUCTION: This exam was performed according to the departmental dose-optimization program which includes automated exposure control, adjustment of the mA and/or kV according to patient size and/or use of iterative reconstruction technique. CONTRAST:  2m OMNIPAQUE IOHEXOL 350 MG/ML SOLN COMPARISON:  Chest CT dated January 03, 2022 FINDINGS: Cardiovascular: No evidence of pulmonary embolus. Mild cardiomegaly. No pericardial effusion. Normal caliber thoracic aorta with  severe atherosclerotic disease. Three-vessel coronary artery calcifications status post CABG. Mediastinum/Nodes: Mildly enlarged subcarinal lymph node, measuring up to 1.3 cm, unchanged in size when compared with prior exam, other subcentimeter mediastinal and hilar lymph nodes are seen which are unchanged in size. Patulous esophagus. Thyroid is unremarkable. Lungs/Pleura: Central airways are patent. Bilateral bronchial wall thickening. Moderate severe centrilobular emphysema. Moderate right and small left loculated pleural effusions, increased in size when compared with prior exam. No evidence of pleural thickening, however contrast timing limits evaluation. Left upper lobe subpleural linear consolidation is unchanged when compared with prior. Upper Abdomen: Partially visualized simple appearing left renal cysts, no further follow-up imaging is recommended for this lesion. No acute abnormality. Musculoskeletal: Prior median sternotomy. Severe compression deformity of T10, unchanged when compared to prior exam. No aggressive appearing osseous lesions. Review of the MIP images confirms the above findings. IMPRESSION: 1. No evidence pulmonary embolus. 2. Moderate right and small left loculated pleural effusions, increased in size when compared to prior exam with new areas of loculation, concerning for empyemas. 3. Increased bilateral bronchial wall thickening, findings can be seen in the setting of bronchitis. 4. Left upper lobe subpleural linear consolidation is unchanged when compared with prior exam and possibly due to scarring, follow-up chest CT in 3 months is recommended to ensure stability. 5. Severe aortic Atherosclerosis (ICD10-I70.0) and Emphysema (ICD10-J43.9). Electronically Signed   By: LYetta GlassmanM.D.   On: 01/11/2022 09:42   DG Chest Portable 1 View  Result Date: 01/04/2022 CLINICAL DATA:  Shortness of breath. EXAM: PORTABLE CHEST 1 VIEW COMPARISON:  January 02, 2022 FINDINGS: Multiple sternal  wires are noted. The heart size and mediastinal contours are within normal limits. There is marked severity calcification of the aortic arch. Mild to moderate severity, diffusely increased interstitial lung markings are seen. Moderate to marked severity areas of atelectasis and/or infiltrate are seen within the bilateral lung bases. There are moderate sized bilateral pleural effusions. No pneumothorax is identified. The visualized skeletal structures are unremarkable. IMPRESSION: 1. Evidence of prior median sternotomy. 2. Mild to moderate severity interstitial edema. 3. Moderate to marked severity bibasilar atelectasis and/or infiltrate. 4. Moderate sized bilateral pleural effusions. Electronically Signed   By: TVirgina NorfolkM.D.   On: 01/08/2022 03:45            LOS: 2 days       NEmeterio Reeve DO [facility] 01/26/2022, 1:16 PM   Staff may message me via secure chat in [place] but this may not receive immediate response,  please page for urgent matters!  If 7PM-7AM, please contact night-coverage [email or web address]  Dictation software was used to generate the above note. Typos may occur and escape review, as with typed/written notes. Please contact Dr ASheppard Coildirectly for clarity if needed.

## 2022-01-26 NOTE — Progress Notes (Signed)
Upon entering room for patient report, Olivia Lane was actively assessing and intervening on Olivia Lane's complaint of shortness of breath.  Bedside monitored showed sats 81% with an appropriate wave form. NRB placed on patient by Chief of Staff. Lung sounds reveal bilateral crackles, RR up to 30s. We placed a pillow behind patient's back to allow patient to sit up.  Rose stated she gave IV lasix early per PM MD. External catheter suction canister has about 229m of urine.  I was able to wean her down to 6L [place]. Alisa RN notified the respiratory therapist and they administered her duoneb tx.  Patient is currently on 4L [place], resting in bed. RR is 18.

## 2022-01-26 NOTE — Progress Notes (Signed)
Olivia Lane's oxygen requirement has increased from 10L to 15L on high flow nasal cannula. Saturations are 85%. RT and MD consulted.  New orders: ABG, Chest Xray, Heated high flow.

## 2022-01-26 NOTE — Progress Notes (Signed)
RT Larene Beach made aware of events with increased oxygen requirement at nonrebreather mask.  RT to come assess.

## 2022-01-26 NOTE — Consult Note (Cosign Needed)
NAME:  Olivia Lane, MRN:  [id number], DOB:  1935/10/01, LOS: 2 ADMISSION DATE:  01/15/2022, CONSULTATION DATE:  01/26/22 REFERRING MD:  Dr. Sheppard Coil, CHIEF COMPLAINT:  Shortness of Breath   History of Present Illness:  86 year old female presenting to [facility] ED from home via EMS with complaints of shortness of breath.  Per ED documentation and EMS report SPO2 88% on chronic 3 L nasal cannula.  Patient reported being in her normal state of health until she woke up on the morning of 01/22/2022, at which time she felt more short of breath than usual and a progressive dry cough.  Patient also reported mild anterior chest pain, suspected to be pleuritic, aggravated by deep breaths, sharp & non radiating.  On admission she denied nausea vomiting diarrhea or abdominal pain without symptoms of UTI.  EMS gave Solu-Medrol IV.  Patient was recently hospitalized from 01/02/2022 to 01/07/2022 due to left femoral neck fracture.  Patient reports that she has not " felt right" since that hip surgery.  And after discharge she is required chronic oxygen. She had been discharged to [facility] for rehab, and was discharged to home on 01/22/22.  ED course: Lab work revealed elevated BNP at 912, NAGMA with serum CO2 18 & very mild leukocytosis-WBC 10.7.  Lactic, PCT, troponin WNL and COVID PCR negative with renal function close to baseline.  Patient tachypneic in the mid 20s but not hypoxic on chronic O2.  Chest x-ray showed interstitial pulmonary edema and bilateral pleural effusions with CTA negative for PE and Doppler negative for DVT.  TRH was consulted and patient was admitted to PCU. Initial Vitals: 97.9, 25, 94, 137/62 & 95% on 3 L [place]  Hospital Course: Admitted to PCU, treatment started for COPD exacerbation including Azithromycin & Rocephin. On 01/16/2022 patient received thoracentesis removing 900 mL of transudate fluid. Since admission the patient has been having intermittent Atrial Fibrillation with RVR.  Cardiology consulted and RVR initially resolved with IV diltiazem & metoprolol. On 7/28, patient became hypoxic with SpO2 in the 80's and A-fib RVR resumed with HR sustaining in the 130's, diltiazem drip started.  Labs significant for a worsening leukocytosis with steroid administration (10.7-18.7), renal function remains close to baseline, anemia stable since admission.  Significant labs: (Labs/ Imaging personally reviewed) 01/26/22 I, Domingo Pulse [place], AGACNP-BC, personally viewed and interpreted this ECG. Chemistry: Na+: 144, K+: 4.3, BUN/Cr.: 24/ 1.16, Serum CO2/ AG: 20/9, Cl: 115, Mg: 2.2 Hematology: WBC: 18.7, Hgb: 8.1, plt: 542  Troponin:  13 > 14, BNP: 912.6, Lactic/ PCT: 1.8/ <0.10 COVID-19: negative ABG: 7.40/ 29/ 72/ 18.0  CXR 01/26/22: Cardiomegaly with worsened interstitial and bilateral perihilar airspace disease, favored to represent edema, though pneumonia is not excluded. Increased moderate left greater than right pleural effusions and worsening aeration at the bases CT angio chest 01/07/2022: No evidence pulmonary embolus. Moderate right and small left loculated pleural effusions, increased in size when compared to prior exam with new areas of loculation, concerning for empyemas. Increased bilateral bronchial wall thickening, findings can be seen in the setting of bronchitis. Left upper lobe subpleural linear consolidation is unchanged when compared with prior exam and possibly due to scarring, follow-up chest CT in 3 months is recommended to ensure stability. Severe aortic Atherosclerosis (ICD10-I70.0) and Emphysema  PCCM consulted for assistance in management and monitoring due to acute on chronic hypoxic respiratory failure requiring HHFNC support & is a HIGH RISK for INTUBATION.  Pertinent  Medical History  CAD s/p CABG x 4 (  2014) with post op A-fib PAD s/p LEFT femoral artery stenting (2010) COPD Former tobacco use (40 pack year history prior to quitting in 2014) Left  femoral neck fracture s/p LEFT hip hemiarthroplasty (12/2021) HTN HLD Anemia  Significant Hospital Events: Including procedures, antibiotic start and stop dates in addition to other pertinent events   7/26: Admitted to PCU for COPD exacerbation with SIRS, treating for CAP with pleural effusion s/p thoracentesis 900 mL transudate. 7/27: worsening leukocytosis while being actively treated with steroids. A-fib RVR, converted to controlled after diltiazem drip started. SLP eval without concern. Asymmetric leg edema [place] negative for DVT 7/28: dyspnea, improved with NRB & nebs with recurrence of A-fib RVR, heparin drip started & diltiazem drip restarted. Cardiology consulted. In the evening, increasing O2 requirement- now on HHFNC 100%/50 L. PCCM consulted  Interim History / Subjective:  Patient alert and responsive. Stating her breathing feels better, but she doesn't like the heated high flow. Discussed plan of care, patient moved to SDU for increased monitoring as patient is a HIGH RISK for INTUBATION.  Objective   Blood pressure (!) 105/57, pulse 78, temperature 98.3 F (36.8 C), temperature source Oral, resp. rate (!) 26, height '5\' 2"'$  (1.575 m), weight 47.4 kg, SpO2 94 %.    FiO2 (%):  [100 %] 100 %   Intake/Output Summary (Last 24 hours) at 01/26/2022 1938 Last data filed at 01/26/2022 3382 Gross per 24 hour  Intake 614.14 ml  Output --  Net 614.14 ml   Filed Weights   01/18/2022 0305 01/26/22 0500  Weight: 47.6 kg 47.4 kg    Examination: General: Adult female, cachectic appearing & acutely ill, lying in bed, NAD HEENT: MM pink/dry, anicteric, atraumatic, neck supple Neuro: A&O x 3, able to follow commands, PERRL +3, MAE CV: s1s2 RRR, A-fib on monitor, no r/m/g Pulm: Regular, non labored at rest on HHFNC 100%/ 50 L, breath sounds diminished throughout GI: soft, flat, non tender, bs x 4 Skin: scar on LEFT hip from recent surgery, no rashes/lesions noted Extremities: warm/dry, pulses +  2 R/P, trace edema noted BLE  Resolved Hospital Problem list     Assessment & Plan:  Acute on Chronic Hypoxic Respiratory Failure secondary to acute on chronic HFpEF in the setting of bilateral pleural effusions s/p thoracentesis PMHx: COPD, former smoker HIGH RISK for INTUBATION, primary service already administered 40 mg of Lasix - continue to diurese daily as tolerated - Strict I&O's - Continue HHFNC , wean FiO2 as tolerated - Supplemental O2 to maintain SpO2 > 88% - Intermittent chest x-ray & ABG PRN - Ensure adequate pulmonary hygiene  - F/u cultures, trend PCT - Continue CAP coverage: ceftriaxone & azithromycin - budesonide nebs BID, change Duo-neb to albuterol Q 4 to assist in ability to clear secretions, bronchodilators PRN - follow up BNP in AM - consider palliative care consultation to determine overall goals of care  Best Practice (right click and "Reselect all SmartList Selections" daily)  Diet/type: Regular consistency (see orders) DVT prophylaxis: systemic heparin GI prophylaxis: PPI Lines: N/A Foley:  N/A Code Status:  full code Last date of multidisciplinary goals of care discussion [per primary]  Labs   CBC: Recent Labs  Lab 01/22/2022 0313 01/25/22 0344 01/26/22 0540  WBC 10.7* 18.2* 18.7*  NEUTROABS 6.8  --   --   HGB 8.7* 7.7* 8.1*  HCT 28.8* 24.9* 25.3*  MCV 89.7 88.9 87.8  PLT 683* 571* 542*    Basic Metabolic Panel: Recent Labs  Lab 01/06/2022  3235 01/25/22 0344 01/26/22 0540 01/26/22 0548  NA 142 141 144  --   K 4.1 4.4 4.3  --   CL 116* 116* 115*  --   CO2 18* 20* 20*  --   GLUCOSE 104* 146* 132*  --   BUN 17 20 24*  --   CREATININE 1.06* 1.23* 1.16*  --   CALCIUM 8.6* 8.4* 8.6*  --   MG 2.3  --   --  2.2   GFR: Estimated Creatinine Clearance: 27 mL/min (A) (by C-G formula based on SCr of 1.16 mg/dL (H)). Recent Labs  Lab 01/09/2022 0313 01/08/2022 0454 01/25/22 0344 01/26/22 0540  PROCALCITON  --  <0.10  --   --   WBC 10.7*  --   18.2* 18.7*  LATICACIDVEN  --  1.8  --   --     Liver Function Tests: Recent Labs  Lab 01/06/2022 0313  AST 18  ALT 10  ALKPHOS 116  BILITOT 0.5  PROT 6.4*  ALBUMIN 2.8*   No results for input(s): "LIPASE", "AMYLASE" in the last 168 hours. No results for input(s): "AMMONIA" in the last 168 hours.  ABG No results found for: "PHART", "PCO2ART", "PO2ART", "HCO3", "TCO2", "ACIDBASEDEF", "O2SAT"   Coagulation Profile: Recent Labs  Lab 01/25/22 1518  INR 1.3*    Cardiac Enzymes: No results for input(s): "CKTOTAL", "CKMB", "CKMBINDEX", "TROPONINI" in the last 168 hours.  HbA1C: No results found for: "HGBA1C"  CBG: No results for input(s): "GLUCAP" in the last 168 hours.  Review of Systems: Positives in BOLD  Gen: Denies fever, chills, weight change, fatigue, night sweats HEENT: Denies blurred vision, double vision, hearing loss, tinnitus, sinus congestion, rhinorrhea, sore throat, neck stiffness, dysphagia PULM: Denies shortness of breath, cough, sputum production, hemoptysis, wheezing CV: Denies chest pain, edema, orthopnea, paroxysmal nocturnal dyspnea, palpitations GI: Denies abdominal pain, nausea, vomiting, diarrhea, hematochezia, melena, constipation, change in bowel habits GU: Denies dysuria, hematuria, polyuria, oliguria, urethral discharge Endocrine: Denies hot or cold intolerance, polyuria, polyphagia or appetite change Derm: Denies rash, dry skin, scaling or peeling skin change Heme: Denies easy bruising, bleeding, bleeding gums Neuro: Denies headache, numbness, weakness, slurred speech, loss of memory or consciousness  Past Medical History:  She,  has a past medical history of Anemia, Gastro-esophageal reflux, Hyperlipidemia, and Hypertension.   Surgical History:   Past Surgical History:  Procedure Laterality Date   APPENDECTOMY  2004   BYPASS GRAFT  2014   CATARACT EXTRACTION  5732,2025   COLONOSCOPY  2004   HIP ARTHROPLASTY Left 01/04/2022   Procedure:  ARTHROPLASTY BIPOLAR HIP (HEMIARTHROPLASTY);  Surgeon: Hessie Knows, MD;  Location: [facility] ORS;  Service: Orthopedics;  Laterality: Left;     Social History:   reports that she quit smoking about 8 years ago. Her smoking use included cigarettes. She has a 40.00 pack-year smoking history. She has never used smokeless tobacco. She reports that she does not drink alcohol and does not use drugs.   Family History:  Her family history includes Coronary artery disease in her sister; Diabetes in her brother; Hypertension in her brother, father, and sister.   Allergies No Known Allergies   Home Medications  Prior to Admission medications   Medication Sig Start Date End Date Taking? Authorizing Provider  albuterol (VENTOLIN HFA) 108 (90 Base) MCG/ACT inhaler Inhale 2 puffs into the lungs every 6 (six) hours as needed for wheezing or shortness of breath. 11/14/21  Yes Abernathy, Alyssa, NP  amLODipine (NORVASC) 10 MG tablet TAKE  1 TABLET BY MOUTH EVERY NIGHT AT BEDTIME FOR HYPERTENSION. 11/14/21   Jonetta Osgood, NP  atorvastatin (LIPITOR) 40 MG tablet Take 1 tablet (40 mg total) by mouth at bedtime. 11/14/21   Jonetta Osgood, NP  budesonide-formoterol (SYMBICORT) 160-4.5 MCG/ACT inhaler Inhale 2 puffs into the lungs 2 (two) times daily. Inhale 2 puffs into lungs 2 (two) times daily PLEASE USE NAME BRAND: SYMBICORT 05/22/21   Jonetta Osgood, NP  clopidogrel (PLAVIX) 75 MG tablet Take 1 tablet (75 mg total) by mouth daily. 11/14/21   Jonetta Osgood, NP  feeding supplement (ENSURE ENLIVE / ENSURE PLUS) LIQD Take 237 mLs by mouth 2 (two) times daily between meals. 01/07/22   Enzo Bi, MD  ferrous [id number] C-folic acid (TRINSICON / FOLTRIN) capsule Take 1 capsule by mouth 2 (two) times daily after a meal. 01/07/22   Enzo Bi, MD  methocarbamol (ROBAXIN) 500 MG tablet Take 1 tablet (500 mg total) by mouth every 6 (six) hours as needed for muscle spasms. 01/07/22   Enzo Bi, MD  metoprolol  tartrate (LOPRESSOR) 50 MG tablet Take 1 tablet (50 mg total) by mouth every 12 (twelve) hours. 11/14/21   Jonetta Osgood, NP  Multiple Vitamin (MULTIVITAMIN WITH MINERALS) TABS tablet Take 1 tablet by mouth daily. 01/07/22   Enzo Bi, MD  nitroGLYCERIN (NITROSTAT) 0.4 MG SL tablet Place under the tongue. 12/02/13   [provider]  oxyCODONE (OXY IR/ROXICODONE) 5 MG immediate release tablet Take 0.5 tablets (2.5 mg total) by mouth every 4 (four) hours as needed for moderate pain. 01/05/22   Reche Dixon, PA-C  pantoprazole (PROTONIX) 40 MG tablet Take 1 tablet (40 mg total) by mouth 2 (two) times daily. 11/14/21   Jonetta Osgood, NP  promethazine (PHENERGAN) 12.5 MG tablet Take 1 tablet (12.5 mg total) by mouth every 6 (six) hours as needed for nausea or vomiting. 04/06/21   Jonetta Osgood, NP  Vitamin D, Ergocalciferol, (DRISDOL) 1.25 MG (50000 UNIT) CAPS capsule Take 1 capsule (50,000 Units total) by mouth every 7 (seven) days. 11/14/21   Jonetta Osgood, NP     Critical care time: 50 minutes       Venetia Night, AGACNP-BC Acute Care Nurse Practitioner [facility]   [phone] / [phone] Please see Amion for pager details.

## 2022-01-27 ENCOUNTER — Inpatient Hospital Stay: Payer: Medicare Other

## 2022-01-27 DIAGNOSIS — J441 Chronic obstructive pulmonary disease with (acute) exacerbation: Secondary | ICD-10-CM | POA: Diagnosis not present

## 2022-01-27 LAB — CBC
HCT: 25.2 % — ABNORMAL LOW (ref 36.0–46.0)
Hemoglobin: 7.9 g/dL — ABNORMAL LOW (ref 12.0–15.0)
MCH: 26.9 pg (ref 26.0–34.0)
MCHC: 31.3 g/dL (ref 30.0–36.0)
MCV: 85.7 fL (ref 80.0–100.0)
Platelets: 568 10*3/uL — ABNORMAL HIGH (ref 150–400)
RBC: 2.94 MIL/uL — ABNORMAL LOW (ref 3.87–5.11)
RDW: 20.8 % — ABNORMAL HIGH (ref 11.5–15.5)
WBC: 16.4 10*3/uL — ABNORMAL HIGH (ref 4.0–10.5)
nRBC: 1.3 % — ABNORMAL HIGH (ref 0.0–0.2)

## 2022-01-27 LAB — BRAIN NATRIURETIC PEPTIDE: B Natriuretic Peptide: 1200.2 pg/mL — ABNORMAL HIGH (ref 0.0–100.0)

## 2022-01-27 LAB — BODY FLUID CULTURE W GRAM STAIN
Culture: NO GROWTH
Gram Stain: NONE SEEN

## 2022-01-27 LAB — BASIC METABOLIC PANEL
Anion gap: 9 (ref 5–15)
BUN: 40 mg/dL — ABNORMAL HIGH (ref 8–23)
CO2: 20 mmol/L — ABNORMAL LOW (ref 22–32)
Calcium: 8.5 mg/dL — ABNORMAL LOW (ref 8.9–10.3)
Chloride: 111 mmol/L (ref 98–111)
Creatinine, Ser: 1.44 mg/dL — ABNORMAL HIGH (ref 0.44–1.00)
GFR, Estimated: 36 mL/min — ABNORMAL LOW (ref >60)
Glucose, Bld: 170 mg/dL — ABNORMAL HIGH (ref 70–99)
Potassium: 3.4 mmol/L — ABNORMAL LOW (ref 3.5–5.1)
Sodium: 140 mmol/L (ref 135–145)

## 2022-01-27 LAB — CHOLESTEROL, BODY FLUID: Cholesterol, Fluid: 14 mg/dL

## 2022-01-27 LAB — PHOSPHORUS: Phosphorus: 3.7 mg/dL (ref 2.5–4.6)

## 2022-01-27 LAB — HEPARIN LEVEL (UNFRACTIONATED): Heparin Unfractionated: 0.91 IU/mL — ABNORMAL HIGH (ref 0.30–0.70)

## 2022-01-27 LAB — MAGNESIUM: Magnesium: 2.1 mg/dL (ref 1.7–2.4)

## 2022-01-27 MED ORDER — GLYCOPYRROLATE 1 MG PO TABS: 1.0000 mg | ORAL_TABLET | ORAL | Status: DC | PRN

## 2022-01-27 MED ORDER — SODIUM CHLORIDE 0.9 % IV SOLN: INTRAVENOUS | Status: DC | PRN

## 2022-01-27 MED ORDER — LORAZEPAM 2 MG/ML IJ SOLN: 2.0000 mg | INTRAMUSCULAR | Status: DC | PRN

## 2022-01-27 MED ORDER — FUROSEMIDE 10 MG/ML IJ SOLN
80.0000 mg | Freq: Once | INTRAMUSCULAR | Status: AC
  Administered 2022-01-27: 80 mg via INTRAVENOUS
  Filled 2022-01-27: qty 8

## 2022-01-27 MED ORDER — PROCHLORPERAZINE MALEATE 10 MG PO TABS: 10.0000 mg | ORAL_TABLET | Freq: Four times a day (QID) | ORAL | Status: DC | PRN

## 2022-01-27 MED ORDER — POTASSIUM CHLORIDE 10 MEQ/100ML IV SOLN
10.0000 meq | INTRAVENOUS | Status: AC
  Administered 2022-01-27 (×2): 10 meq via INTRAVENOUS
  Filled 2022-01-27 (×2): qty 100

## 2022-01-27 MED ORDER — METOPROLOL TARTRATE 25 MG PO TABS: 12.5000 mg | ORAL_TABLET | Freq: Four times a day (QID) | ORAL | Status: DC

## 2022-01-27 MED ORDER — ACETAMINOPHEN 325 MG PO TABS: 650.0000 mg | ORAL_TABLET | Freq: Four times a day (QID) | ORAL | Status: DC | PRN

## 2022-01-27 MED ORDER — PROCHLORPERAZINE EDISYLATE 10 MG/2ML IJ SOLN
10.0000 mg | Freq: Four times a day (QID) | INTRAMUSCULAR | Status: DC | PRN
Start: 2022-01-27 — End: 2022-01-28

## 2022-01-27 MED ORDER — GLYCOPYRROLATE 0.2 MG/ML IJ SOLN: 0.2000 mg | INTRAMUSCULAR | Status: DC | PRN

## 2022-01-27 MED ORDER — HYDROXYZINE HCL 25 MG PO TABS: 25.0000 mg | ORAL_TABLET | Freq: Three times a day (TID) | ORAL | Status: DC | PRN

## 2022-01-27 MED ORDER — POTASSIUM CHLORIDE 20 MEQ PO PACK
40.0000 meq | PACK | Freq: Once | ORAL | Status: DC
Start: 2022-01-27 — End: 2022-01-27

## 2022-01-27 MED ORDER — HALOPERIDOL LACTATE 5 MG/ML IJ SOLN: 2.5000 mg | INTRAMUSCULAR | Status: DC | PRN

## 2022-01-27 MED ORDER — ACETAMINOPHEN 650 MG RE SUPP: 650.0000 mg | Freq: Four times a day (QID) | RECTAL | Status: DC | PRN

## 2022-01-27 MED ORDER — MORPHINE BOLUS VIA INFUSION
5.0000 mg | INTRAVENOUS | Status: DC | PRN
  Administered 2022-01-27: 5 mg via INTRAVENOUS

## 2022-01-27 MED ORDER — POLYVINYL ALCOHOL 1.4 % OP SOLN: 1.0000 [drp] | Freq: Four times a day (QID) | OPHTHALMIC | Status: DC | PRN

## 2022-01-27 MED ORDER — MORPHINE 100MG IN NS 100ML (1MG/ML) PREMIX INFUSION
0.0000 mg/h | INTRAVENOUS | Status: DC
  Administered 2022-01-27: 1 mg/h via INTRAVENOUS
  Filled 2022-01-27: qty 100

## 2022-01-27 MED ORDER — CHLORHEXIDINE GLUCONATE CLOTH 2 % EX PADS
6.0000 | MEDICATED_PAD | Freq: Every day | CUTANEOUS | Status: DC
Start: 2022-01-27 — End: 2022-01-28

## 2022-01-27 MED ORDER — PROCHLORPERAZINE 25 MG RE SUPP
25.0000 mg | Freq: Two times a day (BID) | RECTAL | Status: DC | PRN
Start: 2022-01-27 — End: 2022-01-28

## 2022-01-29 ENCOUNTER — Ambulatory Visit: Payer: Medicare Other | Admitting: Physician Assistant

## 2022-01-29 LAB — CULTURE, BLOOD (ROUTINE X 2)
Culture: NO GROWTH
Culture: NO GROWTH
Special Requests: ADEQUATE
Special Requests: ADEQUATE

## 2022-01-29 LAB — CYTOLOGY - NON PAP

## 2022-01-30 NOTE — Progress Notes (Addendum)
Patient was placed on non rebreather at 100% to transport to CT scan, but became increasingly SOB with sats down to 70's. Dr. Sheppard Coil notified and RT in to see patient. Heated high flow restarted with NRB over soft cannula. Sats now at 84-86%. Breathing improved according to patient. CT scan cancelled for now. Dr. Patsey Berthold confirmed CT cancelled.

## 2022-01-30 NOTE — IPAL (Signed)
  Interdisciplinary Goals of Care Family Meeting   Date carried out: 01-29-2022  Location of the meeting: Bedside  Member's involved: Physician, Bedside Registered Nurse, and Family Member or next of kin  Durable Power of Attorney or acting medical decision maker: two sons, sister, grandson    Discussion: We discussed goals of care for [employer] .  Discussed that aggressive measures to treat heart and lung disease are not showing to be effective, and she is declining to the point that we are doing all we can short of intubation which she has stated she would not want and which would not result in meaningful recovery of lung function. Pt has already stated she would not want CPR and this also would not result in meaningful recovery of cardiac function and would cause more pain. Offered family option to continue aggressive treatment however given that patient has expressed that she is tired and "wants to be done" we have the option to transition to comfort measures to control pain/anxiety and other symptoms and allow natural death likely due to respiratory or cardiac failure. Patient is present but is not alert and not contributory to this conversation. Family are in agreement that they would like to stop aggressive measures and focus on treating her anxiety and air hunger / pain. Family are in agreement for comfort measures only status.   Code status: Full DNR  Disposition: In-patient comfort care  Time spent for the meeting: 35 min    Emeterio Reeve, DO  01-29-2022, 4:23 PM

## 2022-01-30 NOTE — Progress Notes (Signed)
   02/21/22 1100  Clinical Encounter Type  Visited With Patient and family together  Visit Type Initial  Referral From Nurse  Consult/Referral To Chaplain  Spiritual Encounters  Spiritual Needs Prayer;Grief support   Chaplain responded to call to provide support to patient and family. Support provided through reflective listening as patient recounted some life stories and expressed hope in life after death. Chaplain comforted through prayer.

## 2022-01-30 NOTE — Progress Notes (Addendum)
PROGRESS NOTE    Olivia Lane  NAT:[id number] DOB: 09/11/1935  DOA: 01/23/2022 Date of Service: February 20, 2022 PCP: Olivia Osgood, NP     Brief Narrative / Hospital Course:  Olivia Lane is a 86 y.o. female with medical history significant of COPD on 3 L oxygen, hypertension, hyperlipidemia, GERD, anemia, CAD, CABG, postoperative atrial fibrillation not on anticoagulants, CKD-CAD, who presents to ED 01/08/2022 with shortness of breath. 07/26: Admitted for COPD exacerbation. (+)SIRS w/ HR>90, RR>20 but was afebrile and no leukocytosis. Lactic acid 1.8. Loculated pleural effusion, cannot r/o empyema so possible sepsis pending thoracentesis. Treating for CAP w/ azithromycin and ceftriaxone. Thoracentesis yielded likely transudate effusion based on Light's criteria  07/27: WBC increased likely d/t steroids. AFib RVR not responding to IV pushes metoprolol but converted to Afib reg rate w/ cardizem gtt. RN had concerns for choking episode, SLP evaluated and recommended barium swallow study which showed no concerns, po meds restarted. Has asymmetric leg edema with trace right leg edema and 1+ left leg edema, small area of erythema on 3rd toe.  07/28: SOB in AM, improved on NRB and w/ neb tx. Back into AFib RVR and restarted diltizaem bolus/drip, started heparin, consulted cardiology.  Will try tapering off diltiazem drip later today, plan for Eliquis, decreased Lasix. Became more SOB and CXR demonstrated increased fluid, BNP trending up, re-dosed w/ Lasix at higher dose 80 mg IV x1.  07/29: Overnight, increased O2 requirement prompted transfer to SDU. Remains on HFNC this morning. Spoke w/ her son Olivia Lane and her sister at bedside w/ patient - will continue current treatment but will not pursue intubation/CPR. Pulm following. CT chest pending. Repeat Lasix 80 mg IV. Later in the day, continued to decline, was unable to tolerate going to CT d/t even briefly off O2 she had significant  desaturation. Reconvened w/ family and patient, discussed declining status, offered continued treatment as able bs option for comfort measures since patient reports anxiety and "feeling just tired" and desire to "be done." See IPAL note. Transitioned to comfort measures    Consultants:  none  Procedures: none    Subjective: Patient reports subjective shortness of breath, increased WOB, stable from yesterday.      ASSESSMENT & PLAN:   Principal Problem:   COPD exacerbation ([name]) Active Problems:   Acute diastolic congestive heart failure (HCC)   Chronic respiratory failure with hypoxia (HCC)   Coronary artery disease   Essential hypertension   Dyslipidemia   Hx Postoperative atrial fibrillation (HCC)   Stage 3b chronic kidney disease (CKD) (HCC)   Iron deficiency anemia   Protein-calorie malnutrition, moderate (HCC)   Pleural effusion   Atrial fibrillation with RVR ([name])   Advanced care planning/counseling discussion   Protein-calorie malnutrition, severe   COPD exacerbation ([name]) SIRS due to COPD exacerbation Possible sepsis d/t CAP Pleural effusion Patient admits criteria for SIRS/sepsis with heart rate 94 and RR 25.  Lactic acid is 1.8.  Temperature normal, WBC 10.7.   Patient also has loculated pleural effusion, cannot completely rule out empyema though appears to be transudate and procalcitonin not indicative of respiratory bacterial infection Yesterday RN reported declining status around 6:30, ABG and CXR ordered, signed out to ICU and night TRH coverage. Overnight patient transitioned to SDU for increased O2 demand  Unable to tolerate repeat CT today to further evaluate respiratory decline Insignificant UOP w/ Lasix See above - discontinuing aggressive treatment and focus on comfort   Acute diastolic congestive heart failure (HCC) BNP is elevated  912 and chest x-ray showed interstitial pulmonary edema, indicating possible acute CHF.  2D echo showed EF 60 to 65%,  with indeterminant diastolic dysfunction.  Clinically patient seems to have acute dCHF, not responding to Lasix.  See above - discontinuing aggressive treatment and focus on comfort   Atrial fibrillation with RVR ([name]) On admission, patient has a sinus rhythm, with frequent PAC.  Went into A-fib RVR 01/25/2022, initially on diltiazem drip which was tapered off and then needed to be restarted 01/26/2022 at which point cardiology was consulted. Now bradycardic, held beta blockers  See above - discontinuing aggressive treatment and focus on comfort   Coronary artery disease Essential hypertension Dyslipidemia Hx Postoperative atrial fibrillation (HCC) S/p of CABG See above - discontinuing aggressive treatment and focus on comfort   Stage 3b chronic kidney disease (CKD) ([name]) Iron deficiency anemia Protein-calorie malnutrition, moderate ([name]) See above - discontinuing aggressive treatment and focus on comfort   Advanced care planning/counseling discussion Discussed CODE STATUS with patient 07/28, advised given her advanced COPD and history of heart problems, she would be very unlikely to survive cardiac resuscitation/chest compressions or to come off of the ventilator if she was needing that for life support.  Patient states at this point she is not sure how she feels about all that, "would want everything done just to try" she does not have advanced directive but she names her son Olivia Lane") as decision-maker if needed  Discussed 12-Feb-2022 w/ son at beside on morning rounds. Pt and family are in agreement for DO NOT RESUSCITATE if cardiorespiratory arrest occurs but for now continue evaluation & treatment as planned. Pt reports "feeling just tired" and wanting "to be done" Later in the day 02-12-22 pt was unable to go to CT d/t oxygenation was poor, but seemed to rebound okay, I spoke to patient again around 2:30 pm and she reported anxiety was her main concern but I reiterated I can't safely  give sedatives given her respiratory status unless she decides to prioritize comfort / deescalate aggressive treatment but w/ the understanding that this means her lungs will fail and we will not plan on intubating.  See IPAL note and now comfort measures      DVT prophylaxis: lovenox Code Status: FULL Family Communication: Son, Olivia Lane and sister Blanch Media are at bedside this morning Disposition Plan / TOC needs: Anticipate hospital death within 48 hours  Barriers to discharge / significant pending items: now comfort measures             Objective: Vitals:   12-Feb-2022 1511 February 12, 2022 1545 2022/02/12 1547 02/12/2022 1645  BP: (!) 97/41  (!) 99/41   Pulse: (!) 46 (!) 45 64 (!) 43  Resp: (!) 25 (!) 22 (!) 26 (!) 23  Temp:      TempSrc:      SpO2: (!) 76% (!) 79% (!) 79% (!) 77%  Weight:      Height:        Intake/Output Summary (Last 24 hours) at 2022-02-12 1727 Last data filed at Feb 12, 2022 1645 Gross per 24 hour  Intake 1384.69 ml  Output 1175 ml  Net 209.69 ml   Filed Weights   01/19/2022 0305 01/26/22 0500  Weight: 47.6 kg 47.4 kg    Examination:  Constitutional:  VS as above General Appearance: alert, NAD Ears, Nose, Mouth, Throat: Normal appearance Neck: No masses, trachea midline No thyroid enlargement/tenderness/mass appreciated Respiratory: Increased  respiratory effort Breath sounds diminished all fields but especially LLL Wheezing/crackles all fields  Cardiovascular: S1/S2, irreg/irreg No lower extremity edema Gastrointestinal: Nontender, no masses No hernia appreciated Musculoskeletal: No clubbing/cyanosis of digits Neurological: No cranial nerve deficit on limited exam Psychiatric: Normal judgment/insight Flat mood and affect       Scheduled Medications:   Chlorhexidine Gluconate Cloth  6 each Topical Daily    Continuous Infusions:  sodium chloride 10 mL/hr at 02/15/2022 1645   morphine 5 mg/hr (2022/02/15 1656)    PRN Medications:   sodium chloride, glycopyrrolate **OR** glycopyrrolate **OR** glycopyrrolate, haloperidol lactate, LORazepam, morphine, mouth rinse, polyvinyl alcohol, prochlorperazine **OR** prochlorperazine **OR** prochlorperazine  Antimicrobials:  Anti-infectives (From admission, onward)    Start     Dose/Rate Route Frequency Ordered Stop   01/25/22 1000  azithromycin (ZITHROMAX) tablet 250 mg  Status:  Discontinued        250 mg Oral Daily 01/07/2022 0852 01/15/2022 1001   01/25/22 0500  azithromycin (ZITHROMAX) 500 mg in sodium chloride 0.9 % 250 mL IVPB  Status:  Discontinued        500 mg 250 mL/hr over 400 Minutes Intravenous Every 24 hours 01/26/2022 1003 02-15-2022 1622   01/25/22 0500  cefTRIAXone (ROCEPHIN) 1 g in sodium chloride 0.9 % 100 mL IVPB  Status:  Discontinued        1 g 200 mL/hr over 30 Minutes Intravenous Every 24 hours 01/01/2022 1003 Feb 15, 2022 1622   01/17/2022 0600  cefTRIAXone (ROCEPHIN) 1 g in sodium chloride 0.9 % 100 mL IVPB  Status:  Discontinued        1 g 200 mL/hr over 30 Minutes Intravenous Every 24 hours 01/08/2022 0452 01/11/2022 0851   12/30/2021 0445  cefTRIAXone (ROCEPHIN) 1 g in sodium chloride 0.9 % 100 mL IVPB  Status:  Discontinued        1 g 200 mL/hr over 30 Minutes Intravenous  Once 01/16/2022 0433 01/23/2022 0500   01/19/2022 0445  azithromycin (ZITHROMAX) 500 mg in sodium chloride 0.9 % 250 mL IVPB        500 mg 250 mL/hr over 60 Minutes Intravenous  Once 01/02/2022 0433 01/06/2022 0718       Data Reviewed: I have personally reviewed following labs and imaging studies  CBC: Recent Labs  Lab 01/05/2022 0313 01/25/22 0344 01/26/22 0540 01/26/22 2119 02-15-22 0526  WBC 10.7* 18.2* 18.7* 17.4* 16.4*  NEUTROABS 6.8  --   --   --   --   HGB 8.7* 7.7* 8.1* 7.9* 7.9*  HCT 28.8* 24.9* 25.3* 25.5* 25.2*  MCV 89.7 88.9 87.8 86.7 85.7  PLT 683* 571* 542* 608* 443*   Basic Metabolic Panel: Recent Labs  Lab 01/11/2022 0313 01/25/22 0344 01/26/22 0540 01/26/22 0548  02/15/22 0526  NA 142 141 144  --  140  K 4.1 4.4 4.3  --  3.4*  CL 116* 116* 115*  --  111  CO2 18* 20* 20*  --  20*  GLUCOSE 104* 146* 132*  --  170*  BUN 17 20 24*  --  40*  CREATININE 1.06* 1.23* 1.16*  --  1.44*  CALCIUM 8.6* 8.4* 8.6*  --  8.5*  MG 2.3  --   --  2.2 2.1  PHOS  --   --   --   --  3.7   GFR: Estimated Creatinine Clearance: 21.8 mL/min (A) (by C-G formula based on SCr of 1.44 mg/dL (H)). Liver Function Tests: Recent Labs  Lab 01/25/2022 0313  AST 18  ALT 10  ALKPHOS 116  BILITOT 0.5  PROT  6.4*  ALBUMIN 2.8*   No results for input(s): "LIPASE", "AMYLASE" in the last 168 hours. No results for input(s): "AMMONIA" in the last 168 hours. Coagulation Profile: Recent Labs  Lab 01/25/22 1518  INR 1.3*   Cardiac Enzymes: No results for input(s): "CKTOTAL", "CKMB", "CKMBINDEX", "TROPONINI" in the last 168 hours. BNP (last 3 results) No results for input(s): "PROBNP" in the last 8760 hours. HbA1C: No results for input(s): "HGBA1C" in the last 72 hours. CBG: Recent Labs  Lab 01/26/22 2153  GLUCAP 198*   Lipid Profile: No results for input(s): "CHOL", "HDL", "LDLCALC", "TRIG", "CHOLHDL", "LDLDIRECT" in the last 72 hours. Thyroid Function Tests: No results for input(s): "TSH", "T4TOTAL", "FREET4", "T3FREE", "THYROIDAB" in the last 72 hours. Anemia Panel: No results for input(s): "VITAMINB12", "FOLATE", "FERRITIN", "TIBC", "IRON", "RETICCTPCT" in the last 72 hours. Urine analysis:    Component Value Date/Time   COLORURINE YELLOW (A) 01/04/2022 1839   APPEARANCEUR CLEAR (A) 01/09/2022 1839   APPEARANCEUR Clear 03/23/2019 0957   LABSPEC 1.018 01/09/2022 1839   PHURINE 5.0 01/23/2022 1839   GLUCOSEU NEGATIVE 01/12/2022 1839   HGBUR NEGATIVE 12/31/2021 1839   BILIRUBINUR NEGATIVE 01/11/2022 1839   BILIRUBINUR Negative 03/23/2019 0957   KETONESUR NEGATIVE 01/17/2022 1839   PROTEINUR NEGATIVE 01/23/2022 1839   NITRITE NEGATIVE 01/01/2022 1839    LEUKOCYTESUR MODERATE (A) 01/06/2022 1839   Sepsis Labs: '@LABRCNTIP'$ (procalcitonin:4,lacticidven:4)  Recent Results (from the past 240 hour(s))  SARS Coronavirus 2 by RT PCR (hospital order, performed in [place] hospital lab) *cepheid single result test* Anterior Nasal Swab     Status: None   Collection Time: 01/18/2022  3:17 AM   Specimen: Anterior Nasal Swab  Result Value Ref Range Status   SARS Coronavirus 2 by RT PCR NEGATIVE NEGATIVE Final    Comment: (NOTE) SARS-CoV-2 target nucleic acids are NOT DETECTED.  The SARS-CoV-2 RNA is generally detectable in upper and lower respiratory specimens during the acute phase of infection. The lowest concentration of SARS-CoV-2 viral copies this assay can detect is 250 copies / mL. A negative result does not preclude SARS-CoV-2 infection and should not be used as the sole basis for treatment or other patient management decisions.  A negative result may occur with improper specimen collection / handling, submission of specimen other than nasopharyngeal swab, presence of viral mutation(s) within the areas targeted by this assay, and inadequate number of viral copies (<250 copies / mL). A negative result must be combined with clinical observations, patient history, and epidemiological information.  Fact Sheet for Patients:   https://www.patel.info/  Fact Sheet for Healthcare Providers: https://hall.com/  This test is not yet approved or  cleared by the [place] FDA and has been authorized for detection and/or diagnosis of SARS-CoV-2 by FDA under an Emergency Use Authorization (EUA).  This EUA will remain in effect (meaning this test can be used) for the duration of the COVID-19 declaration under Section 564(b)(1) of the Act, 21 U.S.C. section 360bbb-3(b)(1), unless the authorization is terminated or revoked sooner.  Performed at [facility], [address] [place],   [address]   Blood culture (routine x 2)     Status: None (Preliminary result)   Collection Time: 01/04/2022  4:54 AM   Specimen: BLOOD  Result Value Ref Range Status   Specimen Description BLOOD RIGHT [facility]  Final   Special Requests   Final    BOTTLES DRAWN AEROBIC AND ANAEROBIC Blood Culture adequate volume   Culture   Final  NO GROWTH 3 DAYS Performed at [facility], [address] [place], [place] [address]    Report Status PENDING  Incomplete  Blood culture (routine x 2)     Status: None (Preliminary result)   Collection Time: 01/23/2022  4:54 AM   Specimen: BLOOD  Result Value Ref Range Status   Specimen Description BLOOD LEFT [facility]  Final   Special Requests   Final    BOTTLES DRAWN AEROBIC AND ANAEROBIC Blood Culture adequate volume   Culture   Final    NO GROWTH 3 DAYS Performed at [name], [address] [place], [place] [address]    Report Status PENDING  Incomplete  Body fluid culture w Gram Stain     Status: None   Collection Time: 01/02/2022 12:05 PM   Specimen: PATH Cytology Pleural fluid  Result Value Ref Range Status   Specimen Description   Final    PLEURAL Performed at [facility], [address], [place], [place] [address]    Special Requests   Final    PLEURAL Performed at [facility], [address] [place], [place] [address]    Gram Stain NO WBC SEEN NO ORGANISMS SEEN   Final   Culture   Final    NO GROWTH 3 DAYS Performed at [facility], [place] [address], [place],  [address]    Report Status 02-Feb-2022 FINAL  Final         Radiology Studies last 96 hours: DG Chest Port 1 View  Result Date: 01/26/2022 CLINICAL DATA:  Hypoxia EXAM: PORTABLE CHEST 1 VIEW COMPARISON:  01/03/2022 chest x-ray and CT, chest x-ray 01/02/2022 FINDINGS: Post sternotomy changes. Cardiomegaly with vascular congestion and worsened bilateral interstitial and perihilar disease. Probable increased left greater than right  moderate pleural effusions. Basilar consolidations. No pneumothorax. IMPRESSION: 1. Cardiomegaly with worsened interstitial and bilateral perihilar airspace disease, favored to represent edema, though pneumonia is not excluded. Increased moderate left greater than right pleural effusions and worsening aeration at the bases Electronically Signed   By: Donavan Foil M.D.   On: 01/26/2022 19:22   DG ESOPHAGUS W SINGLE CM (SOL OR THIN BA)  Result Date: 01/25/2022 CLINICAL DATA:  Patient with history of hiatal hernia, moderate narrowing of the distal esophagus on UGI 2018, globus sensation with medications, coughing/choking when taking multi-vitamin today. Request for esophagram for further evaluation. EXAM: ESOPHAGUS/BARIUM SWALLOW/TABLET STUDY TECHNIQUE: Single contrast examination was performed using thin liquid barium. This exam was performed by Candiss Norse, PA-C, and was supervised and interpreted by Zetta Bills, MD FLUOROSCOPY: Radiation Exposure Index (as provided by the fluoroscopic device): 22.3 mGy Kerma COMPARISON:  CT angiography of the chest which was performed on January 24, 2022 FINDINGS: Limited exam due to patient debility, she is unable to stand and experiences transient hypoxia when rolled to the oblique position. Swallowing: Swallowing is grossly normal, not well assessed due to oblique position but with no signs of aspiration. Pharynx: Limited evaluation for reasons above. Esophagus: No obstructing lesion of the esophagus. No gross narrowing of the distal esophagus. Motility without signs of substantial stasis in head of bed slightly elevated RPO and LPO positions. Esophageal motility: Within normal limits given limited exam. Hiatal Hernia: None seen on this limited exam. Gastroesophageal reflux: None visualized on limited exam. Ingested 51m barium tablet: Patient refused barium tablet Other: None. IMPRESSION: Single contrast esophagram without evidence of esophageal stricture or aspiration  given limited exam due to the patient's debilitated state  and ongoing medical conditions related to respiratory distress. Electronically Signed   By: Zetta Bills M.D.   On: 01/25/2022 15:21   DG Chest Port 1 View  Result Date: 01/14/2022 CLINICAL DATA:  Status post right thoracentesis. EXAM: PORTABLE CHEST 1 VIEW COMPARISON:  Same day. FINDINGS: No pneumothorax is noted status post right thoracentesis. Right pleural effusion is significantly smaller. IMPRESSION: No pneumothorax status post right thoracentesis. Electronically Signed   By: Marijo Conception M.D.   On: 01/09/2022 12:52   US THORACENTESIS ASP PLEURAL SPACE W/IMG GUIDE  Result Date: 01/01/2022 INDICATION: Patient with history of COPD, shortness of breath, right pleural effusion. Request is made for diagnostic and therapeutic right thoracentesis. EXAM: ULTRASOUND GUIDED DIAGNOSTIC AND THERAPEUTIC RIGHT THORACENTESIS MEDICATIONS: 10 mL 1% lidocaine COMPLICATIONS: None immediate. PROCEDURE: An ultrasound guided thoracentesis was thoroughly discussed with the patient and questions answered. The benefits, risks, alternatives and complications were also discussed. The patient understands and wishes to proceed with the procedure. Written consent was obtained. Ultrasound was performed to localize and mark an adequate pocket of fluid in the right chest. The area was then prepped and draped in the normal sterile fashion. 1% Lidocaine was used for local anesthesia. Under ultrasound guidance a 6 Fr Safe-T-Centesis catheter was introduced. Thoracentesis was performed. The catheter was removed and a dressing applied. FINDINGS: A total of approximately 900 mL of clear, yellow fluid was removed. Samples were sent to the laboratory as requested by the clinical team. IMPRESSION: Successful ultrasound guided diagnostic and therapeutic right thoracentesis yielding 900 mL of pleural fluid. Read by: Brynda Greathouse PA-C Electronically Signed   By: Jerilynn Mages.  Shick M.D.   On:  01/09/2022 12:17   ECHOCARDIOGRAM COMPLETE  Result Date: 01/15/2022    ECHOCARDIOGRAM REPORT   Patient Name:   SHAYLAN TUTTON [facility] Date of Exam: 01/29/2022 Medical Rec #:  [id number]              Height:       62.0 in Accession #:    [id number]             Weight:       105.0 lb Date of Birth:  [birth year]               BSA:          1.454 m Patient Age:    62 years               BP:           102/81 mmHg Patient Gender: F                      HR:           114 bpm. Exam Location:  [facility] Procedure: 2D Echo, Color Doppler and Cardiac Doppler Indications:     I50.31 congestive heart failure-Acute Diastolic  History:         Patient has prior history of Echocardiogram examinations. CAD;                  Risk Factors:Hypertension and Dyslipidemia.  Sonographer:     Charmayne Sheer Referring Phys:  Unknown Foley NIU Diagnosing Phys: Neoma Laming  Sonographer Comments: Suboptimal parasternal window. IMPRESSIONS  1. Left ventricular ejection fraction, by estimation, is 60 to 65%. The left ventricle has normal function. The left ventricle has no regional wall motion abnormalities. Left ventricular diastolic parameters are indeterminate.  2. Right ventricular systolic function is normal. The right  ventricular size is moderately enlarged.  3. Left atrial size was moderately dilated.  4. Right atrial size was moderately dilated.  5. The mitral valve is normal in structure. Mild to moderate mitral valve regurgitation. No evidence of mitral stenosis.  6. The aortic valve is normal in structure. Aortic valve regurgitation is mild to moderate. Aortic valve sclerosis/calcification is present, without any evidence of aortic stenosis.  7. The inferior vena cava is normal in size with greater than 50% respiratory variability, suggesting right atrial pressure of 3 mmHg. FINDINGS  Left Ventricle: Left ventricular ejection fraction, by estimation, is 60 to 65%. The left ventricle has normal function. The left ventricle has no regional wall  motion abnormalities. The left ventricular internal cavity size was normal in size. There is  no left ventricular hypertrophy. Left ventricular diastolic parameters are indeterminate. Right Ventricle: The right ventricular size is moderately enlarged. No increase in right ventricular wall thickness. Right ventricular systolic function is normal. Left Atrium: Left atrial size was moderately dilated. Right Atrium: Right atrial size was moderately dilated. Pericardium: There is no evidence of pericardial effusion. Mitral Valve: The mitral valve is normal in structure. Mild to moderate mitral valve regurgitation. No evidence of mitral valve stenosis. Tricuspid Valve: The tricuspid valve is normal in structure. Tricuspid valve regurgitation is mild . No evidence of tricuspid stenosis. Aortic Valve: The aortic valve is normal in structure. Aortic valve regurgitation is mild to moderate. Aortic valve sclerosis/calcification is present, without any evidence of aortic stenosis. Aortic valve mean gradient measures 9.0 mmHg. Aortic valve peak gradient measures 15.7 mmHg. Aortic valve area, by VTI measures 1.37 cm. Pulmonic Valve: The pulmonic valve was normal in structure. Pulmonic valve regurgitation is not visualized. No evidence of pulmonic stenosis. Aorta: The aortic root is normal in size and structure. Venous: The inferior vena cava is normal in size with greater than 50% respiratory variability, suggesting right atrial pressure of 3 mmHg. IAS/Shunts: No atrial level shunt detected by color flow Doppler.  LEFT VENTRICLE PLAX 2D LVIDd:         3.37 cm   Diastology LVIDs:         2.28 cm   LV e' medial:    4.57 cm/s LV PW:         0.78 cm   LV E/e' medial:  31.6 LV IVS:        0.70 cm   LV e' lateral:   6.20 cm/s LVOT diam:     1.80 cm   LV E/e' lateral: 23.3 LV SV:         53 LV SV Index:   37 LVOT Area:     2.54 cm  RIGHT VENTRICLE RV Basal diam:  3.32 cm LEFT ATRIUM             Index        RIGHT ATRIUM            Index LA diam:        3.80 cm 2.61 cm/m   RA Area:     12.20 cm LA Vol (A2C):   30.4 ml 20.91 ml/m  RA Volume:   26.90 ml  18.50 ml/m LA Vol (A4C):   55.0 ml 37.83 ml/m LA Biplane Vol: 42.1 ml 28.96 ml/m  AORTIC VALVE AV Area (Vmax):    1.48 cm AV Area (Vmean):   1.41 cm AV Area (VTI):     1.37 cm AV Vmax:  198.00 cm/s AV Vmean:          144.000 cm/s AV VTI:            0.390 m AV Peak Grad:      15.7 mmHg AV Mean Grad:      9.0 mmHg LVOT Vmax:         115.00 cm/s LVOT Vmean:        80.000 cm/s LVOT VTI:          0.210 m LVOT/AV VTI ratio: 0.54  AORTA Ao Root diam: 2.70 cm MITRAL VALVE                TRICUSPID VALVE MV Area (PHT): 3.65 cm     TR Peak grad:   40.7 mmHg MV Decel Time: 208 msec     TR Vmax:        319.00 cm/s MV E velocity: 144.33 cm/s                             SHUNTS                             Systemic VTI:  0.21 m                             Systemic Diam: 1.80 cm Neoma Laming Electronically signed by Neoma Laming Signature Date/Time: 01/06/2022/12:12:10 PM    Final    US Venous Img Lower Bilateral (DVT)  Result Date: 01/17/2022 CLINICAL DATA:  Bilateral lower extremity edema.  Evaluate for DVT. EXAM: BILATERAL LOWER EXTREMITY VENOUS DOPPLER ULTRASOUND TECHNIQUE: Gray-scale sonography with graded compression, as well as color Doppler and duplex ultrasound were performed to evaluate the lower extremity deep venous systems from the level of the common femoral vein and including the common femoral, femoral, profunda femoral, popliteal and calf veins including the posterior tibial, peroneal and gastrocnemius veins when visible. The superficial great saphenous vein was also interrogated. Spectral Doppler was utilized to evaluate flow at rest and with distal augmentation maneuvers in the common femoral, femoral and popliteal veins. COMPARISON:  Left lower extremity venous Doppler ultrasound-12/17/2007 (negative). FINDINGS: RIGHT LOWER EXTREMITY Common Femoral Vein: No evidence of  thrombus. Normal compressibility, respiratory phasicity and response to augmentation. Saphenofemoral Junction: No evidence of thrombus. Normal compressibility and flow on color Doppler imaging. Profunda Femoral Vein: No evidence of thrombus. Normal compressibility and flow on color Doppler imaging. Femoral Vein: No evidence of thrombus. Normal compressibility, respiratory phasicity and response to augmentation. Popliteal Vein: No evidence of thrombus. Normal compressibility, respiratory phasicity and response to augmentation. Calf Veins: No evidence of thrombus. Normal compressibility and flow on color Doppler imaging. Superficial Great Saphenous Vein: No evidence of thrombus. Normal compressibility. Other Findings:  None. LEFT LOWER EXTREMITY Common Femoral Vein: No evidence of thrombus. Normal compressibility, respiratory phasicity and response to augmentation. Saphenofemoral Junction: No evidence of thrombus. Normal compressibility and flow on color Doppler imaging. Profunda Femoral Vein: No evidence of thrombus. Normal compressibility and flow on color Doppler imaging. Femoral Vein: No evidence of thrombus. Normal compressibility, respiratory phasicity and response to augmentation. Popliteal Vein: No evidence of thrombus. Normal compressibility, respiratory phasicity and response to augmentation. Calf Veins: No evidence of thrombus. Normal compressibility and flow on color Doppler imaging. Superficial Great Saphenous Vein: No evidence of thrombus. Normal compressibility. Other Findings:  None. IMPRESSION: No  evidence of DVT within either lower extremity. Electronically Signed   By: Sandi Mariscal M.D.   On: 01/22/2022 10:01   CT Angio Chest Pulmonary Embolism (PE) W or WO Contrast  Result Date: 12/30/2021 CLINICAL DATA:  Shortness of breath EXAM: CT ANGIOGRAPHY CHEST WITH CONTRAST TECHNIQUE: Multidetector CT imaging of the chest was performed using the standard protocol during bolus administration of  intravenous contrast. Multiplanar CT image reconstructions and MIPs were obtained to evaluate the vascular anatomy. RADIATION DOSE REDUCTION: This exam was performed according to the departmental dose-optimization program which includes automated exposure control, adjustment of the mA and/or kV according to patient size and/or use of iterative reconstruction technique. CONTRAST:  60m OMNIPAQUE IOHEXOL 350 MG/ML SOLN COMPARISON:  Chest CT dated January 03, 2022 FINDINGS: Cardiovascular: No evidence of pulmonary embolus. Mild cardiomegaly. No pericardial effusion. Normal caliber thoracic aorta with severe atherosclerotic disease. Three-vessel coronary artery calcifications status post CABG. Mediastinum/Nodes: Mildly enlarged subcarinal lymph node, measuring up to 1.3 cm, unchanged in size when compared with prior exam, other subcentimeter mediastinal and hilar lymph nodes are seen which are unchanged in size. Patulous esophagus. Thyroid is unremarkable. Lungs/Pleura: Central airways are patent. Bilateral bronchial wall thickening. Moderate severe centrilobular emphysema. Moderate right and small left loculated pleural effusions, increased in size when compared with prior exam. No evidence of pleural thickening, however contrast timing limits evaluation. Left upper lobe subpleural linear consolidation is unchanged when compared with prior. Upper Abdomen: Partially visualized simple appearing left renal cysts, no further follow-up imaging is recommended for this lesion. No acute abnormality. Musculoskeletal: Prior median sternotomy. Severe compression deformity of T10, unchanged when compared to prior exam. No aggressive appearing osseous lesions. Review of the MIP images confirms the above findings. IMPRESSION: 1. No evidence pulmonary embolus. 2. Moderate right and small left loculated pleural effusions, increased in size when compared to prior exam with new areas of loculation, concerning for empyemas. 3. Increased  bilateral bronchial wall thickening, findings can be seen in the setting of bronchitis. 4. Left upper lobe subpleural linear consolidation is unchanged when compared with prior exam and possibly due to scarring, follow-up chest CT in 3 months is recommended to ensure stability. 5. Severe aortic Atherosclerosis (ICD10-I70.0) and Emphysema (ICD10-J43.9). Electronically Signed   By: LYetta GlassmanM.D.   On: 01/01/2022 09:42   DG Chest Portable 1 View  Result Date: 01/03/2022 CLINICAL DATA:  Shortness of breath. EXAM: PORTABLE CHEST 1 VIEW COMPARISON:  January 02, 2022 FINDINGS: Multiple sternal wires are noted. The heart size and mediastinal contours are within normal limits. There is marked severity calcification of the aortic arch. Mild to moderate severity, diffusely increased interstitial lung markings are seen. Moderate to marked severity areas of atelectasis and/or infiltrate are seen within the bilateral lung bases. There are moderate sized bilateral pleural effusions. No pneumothorax is identified. The visualized skeletal structures are unremarkable. IMPRESSION: 1. Evidence of prior median sternotomy. 2. Mild to moderate severity interstitial edema. 3. Moderate to marked severity bibasilar atelectasis and/or infiltrate. 4. Moderate sized bilateral pleural effusions. Electronically Signed   By: TVirgina NorfolkM.D.   On: 01/12/2022 03:45            LOS: 3 days       NEmeterio Reeve DO [facility] 7August 04, 2023 5:27 PM   Staff may message me via secure chat in [place] but this may not receive immediate response,  please page for urgent matters!  If 7PM-7AM, please contact night-coverage [email or web address]  Dictation software was  used to generate the above note. Typos may occur and escape review, as with typed/written notes. Please contact Dr Sheppard Coil directly for clarity if needed.

## 2022-01-30 NOTE — Progress Notes (Signed)
Dr. Sheppard Coil in to see patient

## 2022-01-30 NOTE — Consult Note (Signed)
ANTICOAGULATION CONSULT NOTE - Follow Up Consult  Pharmacy Consult for heparin gtt Indication: atrial fibrillation  No Known Allergies  Patient Measurements: Height: '5\' 2"'$  (157.5 cm) Weight: 47.4 kg (104 lb 9.6 oz) IBW/kg (Calculated) : 50.1 Heparin Dosing Weight: 47.6kg  Vital Signs: Temp: 97.8 F (36.6 C) (07/29 0400) Temp Source: Oral (07/29 0400) BP: 126/44 (07/29 0400) Pulse Rate: 85 (07/29 0400)  Labs: Recent Labs    01/25/22 0344 01/25/22 1518 01/26/22 0540 01/26/22 0939 01/26/22 1808 01/26/22 1950 01/26/22 2119 01-29-2022 0526  HGB 7.7*  --  8.1*  --   --   --  7.9* 7.9*  HCT 24.9*  --  25.3*  --   --   --  25.5* 25.2*  PLT 571*  --  542*  --   --   --  608* 568*  APTT  --   --   --  21*  --   --   --   --   LABPROT  --  16.1*  --   --   --   --   --   --   INR  --  1.3*  --   --   --   --   --   --   HEPARINUNFRC  --   --   --   --  >1.10* >1.10*  --  0.91*  CREATININE 1.23*  --  1.16*  --   --   --   --  1.44*     Estimated Creatinine Clearance: 21.8 mL/min (A) (by C-G formula based on SCr of 1.44 mg/dL (H)).   Medications: Heparin Dosing Weight: 47.6kg PTA: plavix '75mg'$  QD Inpatient: LMWH '30mg'$  q24h >> heparin gtt,  plavix '75mg'$  QD  Assessment: 86yo F w/ h/o COPD, post-op Afib (no chronic AC per pt preference, h/o VKA), diaCHF, HLD, CAD (CABG x4), PVD, HTN, IDA, & CKD3 presenting to ED with SOB and admitted for COPDE & CAP. Pharmacy consulted for mgmt of heparin gtt.  Goal of Therapy:  Heparin level 0.3-0.7 units/ml Monitor platelets by anticoagulation protocol: Yes   Date Time HL Rate/Comment 7/28    18:08 >1.10 700 units/hr, ? Lab error, will redraw 7/28  19:50 >1.10 700 units/hr hold Heparin drip for 1 hour then restart at 550 units/hr 7/28 0526 0.91   Plan:  Heparin drip supratherapeutic Decrease heparin drip to of 450 units/hr Recheck HL in 8 hours. Continue to monitor H&H and platelets daily while on heparin drip.  Darrick Penna Clinical Pharmacist 29-Jan-2022 7:00 AM

## 2022-01-30 NOTE — Progress Notes (Signed)
Patient with no heart tones, no blood pressure, and no respirations. Patient pronounced by 2 RN's. Next of kin and provider on call notified. Next of kin would like to call on tomorrow for further details.

## 2022-01-30 NOTE — Progress Notes (Signed)
Wynns Elberta Fortis (patient's son), contacted about mother's condition and her room number ICU 3

## 2022-01-30 NOTE — Progress Notes (Signed)
[facility] Cardiology  SUBJECTIVE: Patient laying in bed, denies chest pain   Vitals:   01/26/22 2352 February 05, 2022 0000 02-05-2022 0400 2022/02/05 0445  BP:  (!) 134/55 (!) 126/44   Pulse:  76 85   Resp:  (!) 28 (!) 25   Temp:  97.9 F (36.6 C) 97.8 F (36.6 C)   TempSrc:  Oral Oral   SpO2: 95% 94% 94% 94%  Weight:      Height:         Intake/Output Summary (Last 24 hours) at 02-05-2022 4098 Last data filed at 01/26/2022 2152 Gross per 24 hour  Intake --  Output 350 ml  Net -350 ml      PHYSICAL EXAM  General: Well developed, well nourished, in no acute distress HEENT:  Normocephalic and atramatic Neck:  No JVD.  Lungs: Clear bilaterally to auscultation and percussion. Heart: HRRR . Normal S1 and S2 without gallops or murmurs.  Abdomen: Bowel sounds are positive, abdomen soft and non-tender  Msk:  Back normal, normal gait. Normal strength and tone for age. Extremities: No clubbing, cyanosis or edema.   Neuro: Alert and oriented X 3. Psych:  Good affect, responds appropriately   LABS: Basic Metabolic Panel: Recent Labs    01/26/22 0540 01/26/22 0548 Feb 05, 2022 0526  NA 144  --  140  K 4.3  --  3.4*  CL 115*  --  111  CO2 20*  --  20*  GLUCOSE 132*  --  170*  BUN 24*  --  40*  CREATININE 1.16*  --  1.44*  CALCIUM 8.6*  --  8.5*  MG  --  2.2 2.1  PHOS  --   --  3.7   Liver Function Tests: No results for input(s): "AST", "ALT", "ALKPHOS", "BILITOT", "PROT", "ALBUMIN" in the last 72 hours. No results for input(s): "LIPASE", "AMYLASE" in the last 72 hours. CBC: Recent Labs    01/26/22 2119 2022/02/05 0526  WBC 17.4* 16.4*  HGB 7.9* 7.9*  HCT 25.5* 25.2*  MCV 86.7 85.7  PLT 608* 568*   Cardiac Enzymes: No results for input(s): "CKTOTAL", "CKMB", "CKMBINDEX", "TROPONINI" in the last 72 hours. BNP: Invalid input(s): "POCBNP" D-Dimer: No results for input(s): "DDIMER" in the last 72 hours. Hemoglobin A1C: No results for input(s): "HGBA1C" in the last 72  hours. Fasting Lipid Panel: No results for input(s): "CHOL", "HDL", "LDLCALC", "TRIG", "CHOLHDL", "LDLDIRECT" in the last 72 hours. Thyroid Function Tests: No results for input(s): "TSH", "T4TOTAL", "T3FREE", "THYROIDAB" in the last 72 hours.  Invalid input(s): "FREET3" Anemia Panel: No results for input(s): "VITAMINB12", "FOLATE", "FERRITIN", "TIBC", "IRON", "RETICCTPCT" in the last 72 hours.  DG Chest Port 1 View  Result Date: 01/26/2022 CLINICAL DATA:  Hypoxia EXAM: PORTABLE CHEST 1 VIEW COMPARISON:  01/25/2022 chest x-ray and CT, chest x-ray 01/02/2022 FINDINGS: Post sternotomy changes. Cardiomegaly with vascular congestion and worsened bilateral interstitial and perihilar disease. Probable increased left greater than right moderate pleural effusions. Basilar consolidations. No pneumothorax. IMPRESSION: 1. Cardiomegaly with worsened interstitial and bilateral perihilar airspace disease, favored to represent edema, though pneumonia is not excluded. Increased moderate left greater than right pleural effusions and worsening aeration at the bases Electronically Signed   By: Donavan Foil M.D.   On: 01/26/2022 19:22   DG ESOPHAGUS W SINGLE CM (SOL OR THIN BA)  Result Date: 01/25/2022 CLINICAL DATA:  Patient with history of hiatal hernia, moderate narrowing of the distal esophagus on UGI 2018, globus sensation with medications, coughing/choking when taking multi-vitamin today.  Request for esophagram for further evaluation. EXAM: ESOPHAGUS/BARIUM SWALLOW/TABLET STUDY TECHNIQUE: Single contrast examination was performed using thin liquid barium. This exam was performed by Candiss Norse, PA-C, and was supervised and interpreted by Zetta Bills, MD FLUOROSCOPY: Radiation Exposure Index (as provided by the fluoroscopic device): 22.3 mGy Kerma COMPARISON:  CT angiography of the chest which was performed on January 24, 2022 FINDINGS: Limited exam due to patient debility, she is unable to stand and  experiences transient hypoxia when rolled to the oblique position. Swallowing: Swallowing is grossly normal, not well assessed due to oblique position but with no signs of aspiration. Pharynx: Limited evaluation for reasons above. Esophagus: No obstructing lesion of the esophagus. No gross narrowing of the distal esophagus. Motility without signs of substantial stasis in head of bed slightly elevated RPO and LPO positions. Esophageal motility: Within normal limits given limited exam. Hiatal Hernia: None seen on this limited exam. Gastroesophageal reflux: None visualized on limited exam. Ingested 91m barium tablet: Patient refused barium tablet Other: None. IMPRESSION: Single contrast esophagram without evidence of esophageal stricture or aspiration given limited exam due to the patient's debilitated state and ongoing medical conditions related to respiratory distress. Electronically Signed   By: GZetta BillsM.D.   On: 01/25/2022 15:21     Echo EF 60-65% 01/17/2022  TELEMETRY: Atrial fibrillation 100 bpm:  ASSESSMENT AND PLAN:  Principal Problem:   COPD exacerbation ([place] Active Problems:   Coronary artery disease   Essential hypertension   Hx Postoperative atrial fibrillation (HCC)   Dyslipidemia   Stage 3b chronic kidney disease (CKD) (HCC)   Iron deficiency anemia   Chronic respiratory failure with hypoxia (HCC)   Protein-calorie malnutrition, moderate (HCC)   Pleural effusion   Acute diastolic congestive heart failure (HCC)   Atrial fibrillation with RVR ([place]   Advanced care planning/counseling discussion   Protein-calorie malnutrition, severe    1.  Atrial fibrillation with rapid ventricular rate, exacerbated by toxic respiratory failure, on heparin for stroke prevention, diltiazem drip and metoprolol tartrate for rate control 2.  Acute on chronic hypoxic respiratory failure, multifactorial, secondary to COPD exacerbation, and acute on chronic diastolic congestive heart failure,  status post right thoracentesis 01/13/2022 3.  Acute on chronic diastolic congestive heart failure, HFpEF, EF 60-65% 01/17/2022, received furosemide 80 mg today, 24-hour net output 110 cc, appears euvolemic 4.  Coronary artery disease, CABG times 412/08/2012, denies chest pain 5.  Acute kidney injury with underlying chronic kidney disease, BUN and creatinine 40 and 1.44, respectively  Recommendations  1.  Agree with current therapy 2.  Continue diuresis 3.  Carefully monitor renal status 4.  Transition heparin drip to low-dose apixaban 2.5 mg twice daily when patient more clinically stable 5.  Transition diltiazem drip to Cardizem p.o. when patient more clinically stable   AIsaias Cowman MD, PhD, [facility]708/26/239:33 AM

## 2022-01-30 NOTE — Discharge Summary (Signed)
Physician Discharge Summary   Patient: Olivia Lane MRN: [id number]  DOB: 1936-03-13   Admit:     Date of Admission: 01/19/2022 Admitted from: home   Discharge: Date of discharge: 02/05/22 Disposition:  n/a Condition at discharge:  deceased   CODE STATUS: DNR     Discharge Physician: Emeterio Reeve, DO [facility]     PCP: Jonetta Osgood, NP  Recommendations for Outpatient Follow-up:  N/a        Brief/Interim Summary: Hospital Course:  Stepheny Canal is a 86 y.o. female with medical history significant of COPD on 3 L oxygen, hypertension, hyperlipidemia, GERD, anemia, CAD, CABG, postoperative atrial fibrillation not on anticoagulants, CKD-CAD, who presents to ED 01/12/2022 with shortness of breath. 07/26: Admitted for COPD exacerbation. (+)SIRS w/ HR>90, RR>20 but was afebrile and no leukocytosis. Lactic acid 1.8. Loculated pleural effusion, cannot r/o empyema so possible sepsis pending thoracentesis. Treating for CAP w/ azithromycin and ceftriaxone. Thoracentesis yielded likely transudate effusion based on Light's criteria  07/27: WBC increased likely d/t steroids. AFib RVR not responding to IV pushes metoprolol but converted to Afib reg rate w/ cardizem gtt. RN had concerns for choking episode, SLP evaluated and recommended barium swallow study which showed no concerns, po meds restarted. Has asymmetric leg edema with trace right leg edema and 1+ left leg edema, small area of erythema on 3rd toe.  07/28: SOB in AM, improved on NRB and w/ neb tx. Back into AFib RVR and restarted diltizaem bolus/drip, started heparin, consulted cardiology.  Will try tapering off diltiazem drip later today, plan for Eliquis, decreased Lasix. Became more SOB and CXR demonstrated increased fluid, BNP trending up, re-dosed w/ Lasix at higher dose 80 mg IV x1.  07/29: Overnight, increased O2 requirement prompted transfer to SDU. Remains on HFNC this morning. Spoke w/  her son Nicole Kindred and her sister at bedside w/ patient - will continue current treatment but will not pursue intubation/CPR. Pulm following. CT chest pending. Repeat Lasix 80 mg IV. Later in the day, continued to decline, was unable to tolerate going to CT d/t even briefly off O2 she had significant desaturation. Reconvened w/ family and patient, discussed declining status, offered continued treatment as able vs option for comfort measures since patient reports anxiety and "feeling just tired" and desire to "be done." See IPAL note for full details of this conversation. Transitioned to comfort measures. No official TOD documented but RN entered note at 21:18 on February 05, 2022 to document patient passing.   Consultants:  Cardiology ICU  Procedures:  none      Discharge Diagnoses: Principal Problem:   COPD exacerbation ([place]) Active Problems:   Acute diastolic congestive heart failure (HCC)   Chronic respiratory failure with hypoxia (HCC)   Coronary artery disease   Essential hypertension   Dyslipidemia   Hx Postoperative atrial fibrillation (HCC)   Stage 3b chronic kidney disease (CKD) (HCC)   Iron deficiency anemia   Protein-calorie malnutrition, moderate (HCC)   Pleural effusion   Atrial fibrillation with RVR (HCC)   Advanced care planning/counseling discussion   Protein-calorie malnutrition, severe    Assessment & Plan:   COPD exacerbation ([place]) SIRS due to COPD exacerbation Possible sepsis d/t CAP Pleural effusion Patient admits criteria for SIRS/sepsis with heart rate 94 and RR 25.  Lactic acid is 1.8.  Temperature normal, WBC 10.7.   Patient also has loculated pleural effusion, cannot completely rule out empyema though appears to be transudate and procalcitonin not indicative of respiratory  bacterial infection Yesterday RN reported declining status around 6:30, ABG and CXR ordered, signed out to ICU and night TRH coverage. Overnight patient transitioned to SDU for increased O2  demand  Unable to tolerate repeat CT today to further evaluate respiratory decline Insignificant UOP w/ Lasix See above - discontinuing aggressive treatment and focus on comfort: No official TOD documented but RN entered note at 21:18 on 02/02/2022 to document patient passing.    Acute diastolic congestive heart failure (HCC) BNP is elevated 912 and chest x-ray showed interstitial pulmonary edema, indicating possible acute CHF.  2D echo showed EF 60 to 65%, with indeterminant diastolic dysfunction.  Clinically patient seems to have acute dCHF, not responding to Lasix.  See above - discontinuing aggressive treatment and focus on comfort    Atrial fibrillation with RVR ([place]) On admission, patient has a sinus rhythm, with frequent PAC.  Went into A-fib RVR 01/25/2022, initially on diltiazem drip which was tapered off and then needed to be restarted 01/26/2022 at which point cardiology was consulted. Now bradycardic, held beta blockers  See above - discontinuing aggressive treatment and focus on comfort No official TOD documented but RN entered note at 21:18 on February 02, 2022 to document patient passing.    Coronary artery disease Essential hypertension Dyslipidemia Hx Postoperative atrial fibrillation (HCC) S/p of CABG See above - discontinuing aggressive treatment and focus on comfort  No official TOD documented but RN entered note at 21:18 on 2022/02/02 to document patient passing.    Stage 3b chronic kidney disease (CKD) (HCC) Iron deficiency anemia Protein-calorie malnutrition, moderate ([place]) See above - discontinuing aggressive treatment and focus on comfort  No official TOD documented but RN entered note at 21:18 on 2022/02/02 to document patient passing.    Advanced care planning/counseling discussion Discussed CODE STATUS with patient 07/28, advised given her advanced COPD and history of heart problems, she would be very unlikely to survive cardiac resuscitation/chest compressions or to  come off of the ventilator if she was needing that for life support.  Patient states at this point she is not sure how she feels about all that, "would want everything done just to try" she does not have advanced directive but she names her son Billey Gosling") as decision-maker if needed  Discussed 02-Feb-2022 w/ son at beside on morning rounds. Pt and family are in agreement for DO NOT RESUSCITATE if cardiorespiratory arrest occurs but for now continue evaluation & treatment as planned. Pt reports "feeling just tired" and wanting "to be done" Later in the day 02/02/2022 pt was unable to go to CT d/t oxygenation was poor, but seemed to rebound okay, I spoke to patient again around 2:30 pm and she reported anxiety was her main concern but I reiterated I can't safely give sedatives given her respiratory status unless she decides to prioritize comfort / deescalate aggressive treatment but w/ the understanding that this means her lungs will fail and we will not plan on intubating.  See IPAL note and now comfort measures  No official TOD documented but RN entered note at 21:18 on 2022/02/02 to document patient passing.      Discharge Instructions  Allergies as of 02-02-2022   No Known Allergies      Medication List     ASK your doctor about these medications    albuterol 108 (90 Base) MCG/ACT inhaler Commonly known as: VENTOLIN HFA Inhale 2 puffs into the lungs every 6 (six) hours as needed for wheezing or shortness of breath.   amLODipine  10 MG tablet Commonly known as: NORVASC TAKE 1 TABLET BY MOUTH EVERY NIGHT AT BEDTIME FOR HYPERTENSION.   atorvastatin 40 MG tablet Commonly known as: LIPITOR Take 1 tablet (40 mg total) by mouth at bedtime.   budesonide-formoterol 160-4.5 MCG/ACT inhaler Commonly known as: Symbicort Inhale 2 puffs into the lungs 2 (two) times daily. Inhale 2 puffs into lungs 2 (two) times daily PLEASE USE NAME BRAND: SYMBICORT   clopidogrel 75 MG tablet Commonly known  as: PLAVIX Take 1 tablet (75 mg total) by mouth daily.   feeding supplement Liqd Take 237 mLs by mouth 2 (two) times daily between meals.   ferrous [id number] C-folic acid capsule Commonly known as: TRINSICON / FOLTRIN Take 1 capsule by mouth 2 (two) times daily after a meal.   methocarbamol 500 MG tablet Commonly known as: ROBAXIN Take 1 tablet (500 mg total) by mouth every 6 (six) hours as needed for muscle spasms.   metoprolol tartrate 50 MG tablet Commonly known as: LOPRESSOR Take 1 tablet (50 mg total) by mouth every 12 (twelve) hours.   multivitamin with minerals Tabs tablet Take 1 tablet by mouth daily.   nitroGLYCERIN 0.4 MG SL tablet Commonly known as: NITROSTAT Place under the tongue.   oxyCODONE 5 MG immediate release tablet Commonly known as: Oxy IR/ROXICODONE Take 0.5 tablets (2.5 mg total) by mouth every 4 (four) hours as needed for moderate pain.   pantoprazole 40 MG tablet Commonly known as: PROTONIX Take 1 tablet (40 mg total) by mouth 2 (two) times daily.   promethazine 12.5 MG tablet Commonly known as: PHENERGAN Take 1 tablet (12.5 mg total) by mouth every 6 (six) hours as needed for nausea or vomiting.   Vitamin D (Ergocalciferol) 1.25 MG (50000 UNIT) Caps capsule Commonly known as: DRISDOL Take 1 capsule (50,000 Units total) by mouth every 7 (seven) days.          No Known Allergies   Subjective: n/a   Discharge Exam: Vitals:   16-Feb-2022 1730 02-16-2022 2000  BP:    Pulse: (!) 47 (!) 55  Resp: (!) 21 (!) 6  Temp:    SpO2: (!) 69% (!) 67%  RN pronounced death     The results of significant diagnostics from this hospitalization (including imaging, microbiology, ancillary and laboratory) are listed below for reference.     Microbiology: Recent Results (from the past 240 hour(s))  SARS Coronavirus 2 by RT PCR (hospital order, performed in [facility] hospital lab) *cepheid single result test* Anterior Nasal Swab      Status: None   Collection Time: 01/07/2022  3:17 AM   Specimen: Anterior Nasal Swab  Result Value Ref Range Status   SARS Coronavirus 2 by RT PCR NEGATIVE NEGATIVE Final    Comment: (NOTE) SARS-CoV-2 target nucleic acids are NOT DETECTED.  The SARS-CoV-2 RNA is generally detectable in upper and lower respiratory specimens during the acute phase of infection. The lowest concentration of SARS-CoV-2 viral copies this assay can detect is 250 copies / mL. A negative result does not preclude SARS-CoV-2 infection and should not be used as the sole basis for treatment or other patient management decisions.  A negative result may occur with improper specimen collection / handling, submission of specimen other than nasopharyngeal swab, presence of viral mutation(s) within the areas targeted by this assay, and inadequate number of viral copies (<250 copies / mL). A negative result must be combined with clinical observations, patient history, and epidemiological information.  Fact Sheet for Patients:  https://www.patel.info/  Fact Sheet for Healthcare Providers: https://hall.com/  This test is not yet approved or  cleared by the [place] FDA and has been authorized for detection and/or diagnosis of SARS-CoV-2 by FDA under an Emergency Use Authorization (EUA).  This EUA will remain in effect (meaning this test can be used) for the duration of the COVID-19 declaration under Section 564(b)(1) of the Act, 21 U.S.C. section 360bbb-3(b)(1), unless the authorization is terminated or revoked sooner.  Performed at [facility], [address] [place], [place] [address]   Blood culture (routine x 2)     Status: None (Preliminary result)   Collection Time: 01/22/2022  4:54 AM   Specimen: BLOOD  Result Value Ref Range Status   Specimen Description BLOOD RIGHT [facility]  Final   Special Requests   Final    BOTTLES DRAWN AEROBIC AND ANAEROBIC Blood  Culture adequate volume   Culture   Final    NO GROWTH 4 DAYS Performed at [facility], [address], [place], [place] [address]    Report Status PENDING  Incomplete  Blood culture (routine x 2)     Status: None (Preliminary result)   Collection Time: 01/23/2022  4:54 AM   Specimen: BLOOD  Result Value Ref Range Status   Specimen Description BLOOD LEFT [facility]  Final   Special Requests   Final    BOTTLES DRAWN AEROBIC AND ANAEROBIC Blood Culture adequate volume   Culture   Final    NO GROWTH 4 DAYS Performed at [facility], [address], [place], [place] [address]    Report Status PENDING  Incomplete  Body fluid culture w Gram Stain     Status: None   Collection Time: 01/21/2022 12:05 PM   Specimen: PATH Cytology Pleural fluid  Result Value Ref Range Status   Specimen Description   Final    PLEURAL Performed at [facility], [address], [place], [place] [address]    Special Requests   Final    PLEURAL Performed at [facility], [address] [place], [place] [address]    Gram Stain NO WBC SEEN NO ORGANISMS SEEN   Final   Culture   Final    NO GROWTH 3 DAYS Performed at [facility], [place] [address], [place], [place] [address]    Report Status 02/09/2022 FINAL  Final     Labs: BNP (last 3 results) Recent Labs    01/03/2022 0313 09-Feb-2022 0526  BNP 912.6* 9,417.4*   Basic Metabolic Panel: Recent Labs  Lab 01/08/2022 0313 01/25/22 0344 01/26/22 0540 01/26/22 0548 February 09, 2022 0526  NA 142 141 144  --  140  K 4.1 4.4 4.3  --  3.4*  CL 116* 116* 115*  --  111  CO2 18* 20* 20*  --  20*  GLUCOSE 104* 146* 132*  --  170*  BUN 17 20 24*  --  40*  CREATININE 1.06* 1.23* 1.16*  --  1.44*  CALCIUM 8.6* 8.4* 8.6*  --  8.5*  MG 2.3  --   --  2.2 2.1  PHOS  --   --   --   --  3.7   Liver Function Tests: Recent Labs  Lab 01/18/2022 0313  AST 18  ALT 10  ALKPHOS 116  BILITOT 0.5  PROT 6.4*  ALBUMIN 2.8*   No results for  input(s): "LIPASE", "AMYLASE" in the last 168 hours. No results for input(s): "AMMONIA" in the last 168  hours. CBC: Recent Labs  Lab 01/07/2022 0313 01/25/22 0344 01/26/22 0540 01/26/22 2119 02-13-2022 0526  WBC 10.7* 18.2* 18.7* 17.4* 16.4*  NEUTROABS 6.8  --   --   --   --   HGB 8.7* 7.7* 8.1* 7.9* 7.9*  HCT 28.8* 24.9* 25.3* 25.5* 25.2*  MCV 89.7 88.9 87.8 86.7 85.7  PLT 683* 571* 542* 608* 568*   Cardiac Enzymes: No results for input(s): "CKTOTAL", "CKMB", "CKMBINDEX", "TROPONINI" in the last 168 hours. BNP: Invalid input(s): "POCBNP" CBG: Recent Labs  Lab 01/26/22 2153  GLUCAP 198*   D-Dimer No results for input(s): "DDIMER" in the last 72 hours. Hgb A1c No results for input(s): "HGBA1C" in the last 72 hours. Lipid Profile No results for input(s): "CHOL", "HDL", "LDLCALC", "TRIG", "CHOLHDL", "LDLDIRECT" in the last 72 hours. Thyroid function studies No results for input(s): "TSH", "T4TOTAL", "T3FREE", "THYROIDAB" in the last 72 hours.  Invalid input(s): "FREET3" Anemia work up No results for input(s): "VITAMINB12", "FOLATE", "FERRITIN", "TIBC", "IRON", "RETICCTPCT" in the last 72 hours. Urinalysis    Component Value Date/Time   COLORURINE YELLOW (A) 01/03/2022 1839   APPEARANCEUR CLEAR (A) 01/23/2022 1839   APPEARANCEUR Clear 03/23/2019 0957   LABSPEC 1.018 01/23/2022 1839   PHURINE 5.0 01/02/2022 1839   GLUCOSEU NEGATIVE 01/25/2022 1839   HGBUR NEGATIVE 01/14/2022 1839   BILIRUBINUR NEGATIVE 01/05/2022 1839   BILIRUBINUR Negative 03/23/2019 0957   KETONESUR NEGATIVE 12/31/2021 1839   PROTEINUR NEGATIVE 01/03/2022 1839   NITRITE NEGATIVE 01/29/2022 1839   LEUKOCYTESUR MODERATE (A) 01/05/2022 1839   Sepsis Labs Recent Labs  Lab 01/25/22 0344 01/26/22 0540 01/26/22 2119 2022-02-13 0526  WBC 18.2* 18.7* 17.4* 16.4*   Microbiology Recent Results (from the past 240 hour(s))  SARS Coronavirus 2 by RT PCR (hospital order, performed in [place] hospital  lab) *cepheid single result test* Anterior Nasal Swab     Status: None   Collection Time: 01/08/2022  3:17 AM   Specimen: Anterior Nasal Swab  Result Value Ref Range Status   SARS Coronavirus 2 by RT PCR NEGATIVE NEGATIVE Final    Comment: (NOTE) SARS-CoV-2 target nucleic acids are NOT DETECTED.  The SARS-CoV-2 RNA is generally detectable in upper and lower respiratory specimens during the acute phase of infection. The lowest concentration of SARS-CoV-2 viral copies this assay can detect is 250 copies / mL. A negative result does not preclude SARS-CoV-2 infection and should not be used as the sole basis for treatment or other patient management decisions.  A negative result may occur with improper specimen collection / handling, submission of specimen other than nasopharyngeal swab, presence of viral mutation(s) within the areas targeted by this assay, and inadequate number of viral copies (<250 copies / mL). A negative result must be combined with clinical observations, patient history, and epidemiological information.  Fact Sheet for Patients:   https://www.patel.info/  Fact Sheet for Healthcare Providers: https://hall.com/  This test is not yet approved or  cleared by the [place] FDA and has been authorized for detection and/or diagnosis of SARS-CoV-2 by FDA under an Emergency Use Authorization (EUA).  This EUA will remain in effect (meaning this test can be used) for the duration of the COVID-19 declaration under Section 564(b)(1) of the Act, 21 U.S.C. section 360bbb-3(b)(1), unless the authorization is terminated or revoked sooner.  Performed at [facility], [address] [place], [place] [address]   Blood culture (routine x 2)     Status: None (Preliminary result)   Collection Time:  01/16/2022  4:54 AM   Specimen: BLOOD  Result Value Ref Range Status   Specimen Description BLOOD RIGHT [facility]  Final   Special Requests    Final    BOTTLES DRAWN AEROBIC AND ANAEROBIC Blood Culture adequate volume   Culture   Final    NO GROWTH 4 DAYS Performed at [facility], [address], [place], [place] [address]    Report Status PENDING  Incomplete  Blood culture (routine x 2)     Status: None (Preliminary result)   Collection Time: 01/17/2022  4:54 AM   Specimen: BLOOD  Result Value Ref Range Status   Specimen Description BLOOD LEFT [facility]  Final   Special Requests   Final    BOTTLES DRAWN AEROBIC AND ANAEROBIC Blood Culture adequate volume   Culture   Final    NO GROWTH 4 DAYS Performed at [facility], [address], [place], [place] [address]    Report Status PENDING  Incomplete  Body fluid culture w Gram Stain     Status: None   Collection Time: 01/13/2022 12:05 PM   Specimen: PATH Cytology Pleural fluid  Result Value Ref Range Status   Specimen Description   Final    PLEURAL Performed at [facility], [address], [place], [place] [address]    Special Requests   Final    PLEURAL Performed at [facility], [address], [place], [place] [address]    Gram Stain NO WBC SEEN NO ORGANISMS SEEN   Final   Culture   Final    NO GROWTH 3 DAYS Performed at [facility], [place] [address] [place], [place] [address]    Report Status 02/08/22 FINAL  Final   Imaging DG ESOPHAGUS W SINGLE CM (SOL OR THIN BA)  Result Date: 01/25/2022 CLINICAL DATA:  Patient with history of hiatal hernia, moderate narrowing of the distal esophagus on UGI 2018, globus sensation with medications, coughing/choking when taking multi-vitamin today. Request for esophagram for further evaluation. EXAM: ESOPHAGUS/BARIUM SWALLOW/TABLET STUDY TECHNIQUE: Single contrast examination was performed using thin liquid barium. This exam was performed by Candiss Norse, PA-C, and was supervised and interpreted by Zetta Bills, MD FLUOROSCOPY: Radiation Exposure Index (as provided by the  fluoroscopic device): 22.3 mGy Kerma COMPARISON:  CT angiography of the chest which was performed on January 24, 2022 FINDINGS: Limited exam due to patient debility, she is unable to stand and experiences transient hypoxia when rolled to the oblique position. Swallowing: Swallowing is grossly normal, not well assessed due to oblique position but with no signs of aspiration. Pharynx: Limited evaluation for reasons above. Esophagus: No obstructing lesion of the esophagus. No gross narrowing of the distal esophagus. Motility without signs of substantial stasis in head of bed slightly elevated RPO and LPO positions. Esophageal motility: Within normal limits given limited exam. Hiatal Hernia: None seen on this limited exam. Gastroesophageal reflux: None visualized on limited exam. Ingested 43m barium tablet: Patient refused barium tablet Other: None. IMPRESSION: Single contrast esophagram without evidence of esophageal stricture or aspiration given limited exam due to the patient's debilitated state and ongoing medical conditions related to respiratory distress. Electronically Signed   By: GZetta BillsM.D.   On: 01/25/2022 15:21   DG Chest Port 1 View  Result Date: 01/22/2022 CLINICAL DATA:  Status post right thoracentesis. EXAM: PORTABLE CHEST 1 VIEW COMPARISON:  Same day. FINDINGS: No pneumothorax is noted status post right thoracentesis. Right pleural effusion is significantly smaller. IMPRESSION:  No pneumothorax status post right thoracentesis. Electronically Signed   By: Marijo Conception M.D.   On: 01/26/2022 12:52   US THORACENTESIS ASP PLEURAL SPACE W/IMG GUIDE  Result Date: 01/21/2022 INDICATION: Patient with history of COPD, shortness of breath, right pleural effusion. Request is made for diagnostic and therapeutic right thoracentesis. EXAM: ULTRASOUND GUIDED DIAGNOSTIC AND THERAPEUTIC RIGHT THORACENTESIS MEDICATIONS: 10 mL 1% lidocaine COMPLICATIONS: None immediate. PROCEDURE: An ultrasound guided  thoracentesis was thoroughly discussed with the patient and questions answered. The benefits, risks, alternatives and complications were also discussed. The patient understands and wishes to proceed with the procedure. Written consent was obtained. Ultrasound was performed to localize and mark an adequate pocket of fluid in the right chest. The area was then prepped and draped in the normal sterile fashion. 1% Lidocaine was used for local anesthesia. Under ultrasound guidance a 6 Fr Safe-T-Centesis catheter was introduced. Thoracentesis was performed. The catheter was removed and a dressing applied. FINDINGS: A total of approximately 900 mL of clear, yellow fluid was removed. Samples were sent to the laboratory as requested by the clinical team. IMPRESSION: Successful ultrasound guided diagnostic and therapeutic right thoracentesis yielding 900 mL of pleural fluid. Read by: Brynda Greathouse PA-C Electronically Signed   By: Jerilynn Mages.  Shick M.D.   On: 01/15/2022 12:17   ECHOCARDIOGRAM COMPLETE  Result Date: 01/02/2022    ECHOCARDIOGRAM REPORT   Patient Name:   MARIMAR SUBER [facility] Date of Exam: 01/26/2022 Medical Rec #:  [id number]              Height:       62.0 in Accession #:    [id number]             Weight:       105.0 lb Date of Birth:  January 01, 1936               BSA:          1.454 m Patient Age:    86 years               BP:           102/81 mmHg Patient Gender: F                      HR:           114 bpm. Exam Location:  [facility] Procedure: 2D Echo, Color Doppler and Cardiac Doppler Indications:     I50.31 congestive heart failure-Acute Diastolic  History:         Patient has prior history of Echocardiogram examinations. CAD;                  Risk Factors:Hypertension and Dyslipidemia.  Sonographer:     Charmayne Sheer Referring Phys:  Unknown Foley NIU Diagnosing Phys: Neoma Laming  Sonographer Comments: Suboptimal parasternal window. IMPRESSIONS  1. Left ventricular ejection fraction, by estimation, is 60 to 65%. The left  ventricle has normal function. The left ventricle has no regional wall motion abnormalities. Left ventricular diastolic parameters are indeterminate.  2. Right ventricular systolic function is normal. The right ventricular size is moderately enlarged.  3. Left atrial size was moderately dilated.  4. Right atrial size was moderately dilated.  5. The mitral valve is normal in structure. Mild to moderate mitral valve regurgitation. No evidence of mitral stenosis.  6. The aortic valve is normal in structure. Aortic valve regurgitation is mild to moderate. Aortic valve sclerosis/calcification is present, without any  evidence of aortic stenosis.  7. The inferior vena cava is normal in size with greater than 50% respiratory variability, suggesting right atrial pressure of 3 mmHg. FINDINGS  Left Ventricle: Left ventricular ejection fraction, by estimation, is 60 to 65%. The left ventricle has normal function. The left ventricle has no regional wall motion abnormalities. The left ventricular internal cavity size was normal in size. There is  no left ventricular hypertrophy. Left ventricular diastolic parameters are indeterminate. Right Ventricle: The right ventricular size is moderately enlarged. No increase in right ventricular wall thickness. Right ventricular systolic function is normal. Left Atrium: Left atrial size was moderately dilated. Right Atrium: Right atrial size was moderately dilated. Pericardium: There is no evidence of pericardial effusion. Mitral Valve: The mitral valve is normal in structure. Mild to moderate mitral valve regurgitation. No evidence of mitral valve stenosis. Tricuspid Valve: The tricuspid valve is normal in structure. Tricuspid valve regurgitation is mild . No evidence of tricuspid stenosis. Aortic Valve: The aortic valve is normal in structure. Aortic valve regurgitation is mild to moderate. Aortic valve sclerosis/calcification is present, without any evidence of aortic stenosis. Aortic  valve mean gradient measures 9.0 mmHg. Aortic valve peak gradient measures 15.7 mmHg. Aortic valve area, by VTI measures 1.37 cm. Pulmonic Valve: The pulmonic valve was normal in structure. Pulmonic valve regurgitation is not visualized. No evidence of pulmonic stenosis. Aorta: The aortic root is normal in size and structure. Venous: The inferior vena cava is normal in size with greater than 50% respiratory variability, suggesting right atrial pressure of 3 mmHg. IAS/Shunts: No atrial level shunt detected by color flow Doppler.  LEFT VENTRICLE PLAX 2D LVIDd:         3.37 cm   Diastology LVIDs:         2.28 cm   LV e' medial:    4.57 cm/s LV PW:         0.78 cm   LV E/e' medial:  31.6 LV IVS:        0.70 cm   LV e' lateral:   6.20 cm/s LVOT diam:     1.80 cm   LV E/e' lateral: 23.3 LV SV:         53 LV SV Index:   37 LVOT Area:     2.54 cm  RIGHT VENTRICLE RV Basal diam:  3.32 cm LEFT ATRIUM             Index        RIGHT ATRIUM           Index LA diam:        3.80 cm 2.61 cm/m   RA Area:     12.20 cm LA Vol (A2C):   30.4 ml 20.91 ml/m  RA Volume:   26.90 ml  18.50 ml/m LA Vol (A4C):   55.0 ml 37.83 ml/m LA Biplane Vol: 42.1 ml 28.96 ml/m  AORTIC VALVE AV Area (Vmax):    1.48 cm AV Area (Vmean):   1.41 cm AV Area (VTI):     1.37 cm AV Vmax:           198.00 cm/s AV Vmean:          144.000 cm/s AV VTI:            0.390 m AV Peak Grad:      15.7 mmHg AV Mean Grad:      9.0 mmHg LVOT Vmax:         115.00 cm/s LVOT  Vmean:        80.000 cm/s LVOT VTI:          0.210 m LVOT/AV VTI ratio: 0.54  AORTA Ao Root diam: 2.70 cm MITRAL VALVE                TRICUSPID VALVE MV Area (PHT): 3.65 cm     TR Peak grad:   40.7 mmHg MV Decel Time: 208 msec     TR Vmax:        319.00 cm/s MV E velocity: 144.33 cm/s                             SHUNTS                             Systemic VTI:  0.21 m                             Systemic Diam: 1.80 cm Neoma Laming Electronically signed by Neoma Laming Signature Date/Time:  01/10/2022/12:12:10 PM    Final    US Venous Img Lower Bilateral (DVT)  Result Date: 01/15/2022 CLINICAL DATA:  Bilateral lower extremity edema.  Evaluate for DVT. EXAM: BILATERAL LOWER EXTREMITY VENOUS DOPPLER ULTRASOUND TECHNIQUE: Gray-scale sonography with graded compression, as well as color Doppler and duplex ultrasound were performed to evaluate the lower extremity deep venous systems from the level of the common femoral vein and including the common femoral, femoral, profunda femoral, popliteal and calf veins including the posterior tibial, peroneal and gastrocnemius veins when visible. The superficial great saphenous vein was also interrogated. Spectral Doppler was utilized to evaluate flow at rest and with distal augmentation maneuvers in the common femoral, femoral and popliteal veins. COMPARISON:  Left lower extremity venous Doppler ultrasound-12/17/2007 (negative). FINDINGS: RIGHT LOWER EXTREMITY Common Femoral Vein: No evidence of thrombus. Normal compressibility, respiratory phasicity and response to augmentation. Saphenofemoral Junction: No evidence of thrombus. Normal compressibility and flow on color Doppler imaging. Profunda Femoral Vein: No evidence of thrombus. Normal compressibility and flow on color Doppler imaging. Femoral Vein: No evidence of thrombus. Normal compressibility, respiratory phasicity and response to augmentation. Popliteal Vein: No evidence of thrombus. Normal compressibility, respiratory phasicity and response to augmentation. Calf Veins: No evidence of thrombus. Normal compressibility and flow on color Doppler imaging. Superficial Great Saphenous Vein: No evidence of thrombus. Normal compressibility. Other Findings:  None. LEFT LOWER EXTREMITY Common Femoral Vein: No evidence of thrombus. Normal compressibility, respiratory phasicity and response to augmentation. Saphenofemoral Junction: No evidence of thrombus. Normal compressibility and flow on color Doppler imaging.  Profunda Femoral Vein: No evidence of thrombus. Normal compressibility and flow on color Doppler imaging. Femoral Vein: No evidence of thrombus. Normal compressibility, respiratory phasicity and response to augmentation. Popliteal Vein: No evidence of thrombus. Normal compressibility, respiratory phasicity and response to augmentation. Calf Veins: No evidence of thrombus. Normal compressibility and flow on color Doppler imaging. Superficial Great Saphenous Vein: No evidence of thrombus. Normal compressibility. Other Findings:  None. IMPRESSION: No evidence of DVT within either lower extremity. Electronically Signed   By: Sandi Mariscal M.D.   On: 01/13/2022 10:01   CT Angio Chest Pulmonary Embolism (PE) W or WO Contrast  Result Date: 01/15/2022 CLINICAL DATA:  Shortness of breath EXAM: CT ANGIOGRAPHY CHEST WITH CONTRAST TECHNIQUE: Multidetector CT imaging of the chest was performed using the standard protocol  during bolus administration of intravenous contrast. Multiplanar CT image reconstructions and MIPs were obtained to evaluate the vascular anatomy. RADIATION DOSE REDUCTION: This exam was performed according to the departmental dose-optimization program which includes automated exposure control, adjustment of the mA and/or kV according to patient size and/or use of iterative reconstruction technique. CONTRAST:  56m OMNIPAQUE IOHEXOL 350 MG/ML SOLN COMPARISON:  Chest CT dated January 03, 2022 FINDINGS: Cardiovascular: No evidence of pulmonary embolus. Mild cardiomegaly. No pericardial effusion. Normal caliber thoracic aorta with severe atherosclerotic disease. Three-vessel coronary artery calcifications status post CABG. Mediastinum/Nodes: Mildly enlarged subcarinal lymph node, measuring up to 1.3 cm, unchanged in size when compared with prior exam, other subcentimeter mediastinal and hilar lymph nodes are seen which are unchanged in size. Patulous esophagus. Thyroid is unremarkable. Lungs/Pleura: Central airways  are patent. Bilateral bronchial wall thickening. Moderate severe centrilobular emphysema. Moderate right and small left loculated pleural effusions, increased in size when compared with prior exam. No evidence of pleural thickening, however contrast timing limits evaluation. Left upper lobe subpleural linear consolidation is unchanged when compared with prior. Upper Abdomen: Partially visualized simple appearing left renal cysts, no further follow-up imaging is recommended for this lesion. No acute abnormality. Musculoskeletal: Prior median sternotomy. Severe compression deformity of T10, unchanged when compared to prior exam. No aggressive appearing osseous lesions. Review of the MIP images confirms the above findings. IMPRESSION: 1. No evidence pulmonary embolus. 2. Moderate right and small left loculated pleural effusions, increased in size when compared to prior exam with new areas of loculation, concerning for empyemas. 3. Increased bilateral bronchial wall thickening, findings can be seen in the setting of bronchitis. 4. Left upper lobe subpleural linear consolidation is unchanged when compared with prior exam and possibly due to scarring, follow-up chest CT in 3 months is recommended to ensure stability. 5. Severe aortic Atherosclerosis (ICD10-I70.0) and Emphysema (ICD10-J43.9). Electronically Signed   By: LYetta GlassmanM.D.   On: 01/22/2022 09:42   DG Chest Portable 1 View  Result Date: 12/30/2021 CLINICAL DATA:  Shortness of breath. EXAM: PORTABLE CHEST 1 VIEW COMPARISON:  January 02, 2022 FINDINGS: Multiple sternal wires are noted. The heart size and mediastinal contours are within normal limits. There is marked severity calcification of the aortic arch. Mild to moderate severity, diffusely increased interstitial lung markings are seen. Moderate to marked severity areas of atelectasis and/or infiltrate are seen within the bilateral lung bases. There are moderate sized bilateral pleural effusions. No  pneumothorax is identified. The visualized skeletal structures are unremarkable. IMPRESSION: 1. Evidence of prior median sternotomy. 2. Mild to moderate severity interstitial edema. 3. Moderate to marked severity bibasilar atelectasis and/or infiltrate. 4. Moderate sized bilateral pleural effusions. Electronically Signed   By: TVirgina NorfolkM.D.   On: 01/28/2022 03:45      Time coordinating discharge: Over 30 minutes  SIGNED:  NEmeterio ReeveDO [facility]

## 2022-01-30 NOTE — Progress Notes (Signed)
Patient status is now comfort measures only. Morphine infusion initiated at 5 mg/hour. Heparin infusion has been discontinued. Patient is awake, and says she is feeling "sick all over". She does appear anxious and has verbalized this. Request a "pill" to help her. HR in low 40's and O2 sat 71% on HHF O2. Pt and family informed the morphine infusion should help her relax and dose can be adjusted as needed. Family verbalized appreciation.

## 2022-01-30 NOTE — Progress Notes (Signed)
eLink Physician-Brief Progress Note Patient Name: Olivia Lane DOB: June 15, 1936 MRN: [id number]   Date of Service  02/26/2022  HPI/Events of Note  86 y/o F a/w Acute on Chronic Hypoxic Respiratory Failure secondary to decompensated HFpEF + bilateral pleural effusions s/p thoracentesis On top of baseline COPD/ Moved to ICU as deemed high risk for intubation.    eICU Interventions  - Got '40mg'$  Lasix on floor before coming here  - continue to diurese daily as tolerated - Continue HHFNC , wean FiO2 as tolerated - seen on E-Link cam, appears comfortable but with increased RR. Would use BiPAP prn if needed will help with both HFpEF and COPD component  - CXR as needed for clinical change - obtain ABG only if there is a a change in mentation, otherwise can f/u sats  - Continue CAP coverage: ceftriaxone & azithromycin - nebs BID to continue         Amantha Sklar N Crystalle Popwell February 26, 2022, 12:28 AM

## 2022-01-30 NOTE — Progress Notes (Signed)
Patient resting comfortably at this time, on morphine drip at 5 mg per hour. Does not open eyes to voice. Respirations 12-14 per minute, and non labored. Family at bedside and stated they feel patient appears comfortable.

## 2022-01-30 NOTE — Progress Notes (Signed)
Dr. Sheppard Coil in to speak with family to discuss patient's condition and goals of care

## 2022-01-30 DEATH — deceased

## 2022-02-06 LAB — MISC LABCORP TEST (SEND OUT)
LabCorp test name: 5367
Labcorp test code: 9985

## 2022-05-08 ENCOUNTER — Ambulatory Visit: Payer: Medicare Other | Admitting: Nurse Practitioner

## 2022-05-29 ENCOUNTER — Ambulatory Visit: Payer: Medicare Other | Admitting: Nurse Practitioner

## 2022-06-06 ENCOUNTER — Telehealth: Payer: Medicare Other

## 2022-07-04 ENCOUNTER — Telehealth: Payer: Medicare Other
